# Patient Record
Sex: Female | Born: 1935 | Race: White | Hispanic: No | Marital: Married | Smoking: Never smoker
Health system: Southern US, Community
[De-identification: ages and names within clinical notes are randomized; demographics above are authoritative.]

## PROBLEM LIST (undated history)

## (undated) ENCOUNTER — Emergency Department: Admission: EM | Discharge status: Absent without leave | Payer: Medicare Other

## (undated) DIAGNOSIS — F329 Major depressive disorder, single episode, unspecified: Secondary | ICD-10-CM

## (undated) DIAGNOSIS — E039 Hypothyroidism, unspecified: Secondary | ICD-10-CM

## (undated) DIAGNOSIS — I2510 Atherosclerotic heart disease of native coronary artery without angina pectoris: Secondary | ICD-10-CM

## (undated) DIAGNOSIS — I499 Cardiac arrhythmia, unspecified: Secondary | ICD-10-CM

## (undated) DIAGNOSIS — F0390 Unspecified dementia without behavioral disturbance: Secondary | ICD-10-CM

## (undated) DIAGNOSIS — N189 Chronic kidney disease, unspecified: Secondary | ICD-10-CM

## (undated) DIAGNOSIS — F32A Depression, unspecified: Secondary | ICD-10-CM

## (undated) DIAGNOSIS — I509 Heart failure, unspecified: Secondary | ICD-10-CM

## (undated) DIAGNOSIS — M1990 Unspecified osteoarthritis, unspecified site: Secondary | ICD-10-CM

## (undated) DIAGNOSIS — C801 Malignant (primary) neoplasm, unspecified: Secondary | ICD-10-CM

## (undated) DIAGNOSIS — I4891 Unspecified atrial fibrillation: Secondary | ICD-10-CM

## (undated) DIAGNOSIS — Z992 Dependence on renal dialysis: Secondary | ICD-10-CM

## (undated) DIAGNOSIS — I639 Cerebral infarction, unspecified: Secondary | ICD-10-CM

## (undated) DIAGNOSIS — F419 Anxiety disorder, unspecified: Secondary | ICD-10-CM

## (undated) DIAGNOSIS — D649 Anemia, unspecified: Secondary | ICD-10-CM

## (undated) DIAGNOSIS — I10 Essential (primary) hypertension: Secondary | ICD-10-CM

## (undated) DIAGNOSIS — C7A1 Malignant poorly differentiated neuroendocrine tumors: Secondary | ICD-10-CM

## (undated) DIAGNOSIS — I340 Nonrheumatic mitral (valve) insufficiency: Secondary | ICD-10-CM

## (undated) DIAGNOSIS — M810 Age-related osteoporosis without current pathological fracture: Secondary | ICD-10-CM

## (undated) HISTORY — DX: Unspecified atrial fibrillation: I48.91

## (undated) HISTORY — PX: CHOLECYSTECTOMY: SHX55

## (undated) HISTORY — PX: BACK SURGERY: SHX140

## (undated) HISTORY — PX: ABDOMINAL HYSTERECTOMY: SHX81

## (undated) HISTORY — PX: JOINT REPLACEMENT: SHX530

## (undated) HISTORY — PX: EYE SURGERY: SHX253

## (undated) HISTORY — DX: Malignant poorly differentiated neuroendocrine tumors: C7A.1

## (undated) HISTORY — PX: SHOULDER SURGERY: SHX246

---

## 2013-05-07 ENCOUNTER — Ambulatory Visit: Payer: Self-pay | Admitting: Oncology

## 2013-05-26 ENCOUNTER — Ambulatory Visit: Payer: Self-pay | Admitting: Oncology

## 2013-05-26 LAB — CBC CANCER CENTER
BASOS ABS: 0 x10 3/mm (ref 0.0–0.1)
Basophil %: 0.3 %
Eosinophil #: 0.3 x10 3/mm (ref 0.0–0.7)
Eosinophil %: 5.3 %
HCT: 32.8 % — ABNORMAL LOW (ref 35.0–47.0)
HGB: 10.5 g/dL — AB (ref 12.0–16.0)
Lymphocyte #: 1.4 x10 3/mm (ref 1.0–3.6)
Lymphocyte %: 22.8 %
MCH: 28.6 pg (ref 26.0–34.0)
MCHC: 32.1 g/dL (ref 32.0–36.0)
MCV: 89 fL (ref 80–100)
Monocyte #: 0.4 x10 3/mm (ref 0.2–0.9)
Monocyte %: 6.2 %
NEUTROS ABS: 3.9 x10 3/mm (ref 1.4–6.5)
Neutrophil %: 65.4 %
PLATELETS: 202 x10 3/mm (ref 150–440)
RBC: 3.68 10*6/uL — AB (ref 3.80–5.20)
RDW: 14.5 % (ref 11.5–14.5)
WBC: 6 x10 3/mm (ref 3.6–11.0)

## 2013-05-26 LAB — COMPREHENSIVE METABOLIC PANEL
ALBUMIN: 3.7 g/dL (ref 3.4–5.0)
ALT: 15 U/L (ref 12–78)
Alkaline Phosphatase: 91 U/L
Anion Gap: 9 (ref 7–16)
BUN: 49 mg/dL — ABNORMAL HIGH (ref 7–18)
Bilirubin,Total: 0.4 mg/dL (ref 0.2–1.0)
Calcium, Total: 8.9 mg/dL (ref 8.5–10.1)
Chloride: 105 mmol/L (ref 98–107)
Co2: 26 mmol/L (ref 21–32)
Creatinine: 2.39 mg/dL — ABNORMAL HIGH (ref 0.60–1.30)
GFR CALC AF AMER: 21 — AB
GFR CALC NON AF AMER: 18 — AB
GLUCOSE: 103 mg/dL — AB (ref 65–99)
OSMOLALITY: 293 (ref 275–301)
POTASSIUM: 4.4 mmol/L (ref 3.5–5.1)
SGOT(AST): 21 U/L (ref 15–37)
Sodium: 140 mmol/L (ref 136–145)
TOTAL PROTEIN: 6.9 g/dL (ref 6.4–8.2)

## 2013-06-20 ENCOUNTER — Ambulatory Visit: Payer: Self-pay | Admitting: Oncology

## 2013-06-30 ENCOUNTER — Ambulatory Visit: Payer: Self-pay | Admitting: Nephrology

## 2013-07-21 ENCOUNTER — Ambulatory Visit: Payer: Self-pay | Admitting: Oncology

## 2013-08-18 LAB — CBC CANCER CENTER
BASOS PCT: 1.4 %
Basophil #: 0.1 x10 3/mm (ref 0.0–0.1)
EOS ABS: 0.2 x10 3/mm (ref 0.0–0.7)
EOS PCT: 2.7 %
HCT: 31.9 % — ABNORMAL LOW (ref 35.0–47.0)
HGB: 10.8 g/dL — ABNORMAL LOW (ref 12.0–16.0)
LYMPHS PCT: 19.7 %
Lymphocyte #: 1.3 x10 3/mm (ref 1.0–3.6)
MCH: 29.9 pg (ref 26.0–34.0)
MCHC: 33.7 g/dL (ref 32.0–36.0)
MCV: 89 fL (ref 80–100)
MONO ABS: 0.4 x10 3/mm (ref 0.2–0.9)
Monocyte %: 6.2 %
NEUTROS PCT: 70 %
Neutrophil #: 4.6 x10 3/mm (ref 1.4–6.5)
PLATELETS: 199 x10 3/mm (ref 150–440)
RBC: 3.6 10*6/uL — ABNORMAL LOW (ref 3.80–5.20)
RDW: 13.4 % (ref 11.5–14.5)
WBC: 6.6 x10 3/mm (ref 3.6–11.0)

## 2013-08-18 LAB — COMPREHENSIVE METABOLIC PANEL
ALBUMIN: 3.4 g/dL (ref 3.4–5.0)
ANION GAP: 7 (ref 7–16)
Alkaline Phosphatase: 89 U/L
BILIRUBIN TOTAL: 0.3 mg/dL (ref 0.2–1.0)
BUN: 47 mg/dL — ABNORMAL HIGH (ref 7–18)
CREATININE: 2.51 mg/dL — AB (ref 0.60–1.30)
Calcium, Total: 8.6 mg/dL (ref 8.5–10.1)
Chloride: 103 mmol/L (ref 98–107)
Co2: 28 mmol/L (ref 21–32)
EGFR (Non-African Amer.): 17 — ABNORMAL LOW
GFR CALC AF AMER: 20 — AB
Glucose: 101 mg/dL — ABNORMAL HIGH (ref 65–99)
OSMOLALITY: 288 (ref 275–301)
Potassium: 4.6 mmol/L (ref 3.5–5.1)
SGOT(AST): 19 U/L (ref 15–37)
SGPT (ALT): 16 U/L (ref 12–78)
Sodium: 138 mmol/L (ref 136–145)
Total Protein: 6.6 g/dL (ref 6.4–8.2)

## 2013-08-20 ENCOUNTER — Ambulatory Visit: Payer: Self-pay | Admitting: Oncology

## 2013-08-23 DIAGNOSIS — N184 Chronic kidney disease, stage 4 (severe): Secondary | ICD-10-CM | POA: Insufficient documentation

## 2013-08-23 DIAGNOSIS — E039 Hypothyroidism, unspecified: Secondary | ICD-10-CM | POA: Insufficient documentation

## 2013-08-23 DIAGNOSIS — I2510 Atherosclerotic heart disease of native coronary artery without angina pectoris: Secondary | ICD-10-CM | POA: Insufficient documentation

## 2013-09-20 ENCOUNTER — Ambulatory Visit: Payer: Self-pay | Admitting: Oncology

## 2013-10-21 ENCOUNTER — Ambulatory Visit: Payer: Self-pay | Admitting: Oncology

## 2013-11-10 LAB — COMPREHENSIVE METABOLIC PANEL
ALBUMIN: 3.5 g/dL (ref 3.4–5.0)
ANION GAP: 8 (ref 7–16)
Alkaline Phosphatase: 108 U/L
BUN: 36 mg/dL — AB (ref 7–18)
Bilirubin,Total: 0.4 mg/dL (ref 0.2–1.0)
CO2: 25 mmol/L (ref 21–32)
Calcium, Total: 8.7 mg/dL (ref 8.5–10.1)
Chloride: 106 mmol/L (ref 98–107)
Creatinine: 2.24 mg/dL — ABNORMAL HIGH (ref 0.60–1.30)
GFR CALC AF AMER: 22 — AB
GFR CALC NON AF AMER: 19 — AB
Glucose: 179 mg/dL — ABNORMAL HIGH (ref 65–99)
Osmolality: 290 (ref 275–301)
POTASSIUM: 4.6 mmol/L (ref 3.5–5.1)
SGOT(AST): 21 U/L (ref 15–37)
SGPT (ALT): 21 U/L
Sodium: 139 mmol/L (ref 136–145)
TOTAL PROTEIN: 6.5 g/dL (ref 6.4–8.2)

## 2013-11-10 LAB — CBC CANCER CENTER
Basophil #: 0.1 x10 3/mm (ref 0.0–0.1)
Basophil %: 1.4 %
Eosinophil #: 0.1 x10 3/mm (ref 0.0–0.7)
Eosinophil %: 3 %
HCT: 31.4 % — ABNORMAL LOW (ref 35.0–47.0)
HGB: 10.2 g/dL — ABNORMAL LOW (ref 12.0–16.0)
LYMPHS PCT: 28.5 %
Lymphocyte #: 1.3 x10 3/mm (ref 1.0–3.6)
MCH: 29.5 pg (ref 26.0–34.0)
MCHC: 32.5 g/dL (ref 32.0–36.0)
MCV: 91 fL (ref 80–100)
MONOS PCT: 5.5 %
Monocyte #: 0.2 x10 3/mm (ref 0.2–0.9)
Neutrophil #: 2.7 x10 3/mm (ref 1.4–6.5)
Neutrophil %: 61.6 %
Platelet: 207 x10 3/mm (ref 150–440)
RBC: 3.47 10*6/uL — AB (ref 3.80–5.20)
RDW: 13.6 % (ref 11.5–14.5)
WBC: 4.4 x10 3/mm (ref 3.6–11.0)

## 2013-11-20 ENCOUNTER — Ambulatory Visit: Payer: Self-pay | Admitting: Oncology

## 2013-11-26 ENCOUNTER — Ambulatory Visit: Payer: Self-pay | Admitting: Orthopedic Surgery

## 2013-11-26 LAB — BODY FLUID CELL COUNT WITH DIFFERENTIAL
Basophil: 0 %
EOS PCT: 0 %
Lymphocytes: 0 %
NUCLEATED CELL COUNT: 76836 /mm3
Neutrophils: 100 %
OTHER CELLS BF: 0 %
OTHER MONONUCLEAR CELLS: 0 %

## 2013-11-27 ENCOUNTER — Inpatient Hospital Stay: Payer: Self-pay | Admitting: Orthopedic Surgery

## 2013-11-27 DIAGNOSIS — I2510 Atherosclerotic heart disease of native coronary artery without angina pectoris: Secondary | ICD-10-CM

## 2013-11-27 DIAGNOSIS — I10 Essential (primary) hypertension: Secondary | ICD-10-CM

## 2013-11-27 LAB — BASIC METABOLIC PANEL
ANION GAP: 9 (ref 7–16)
BUN: 29 mg/dL — ABNORMAL HIGH (ref 7–18)
Calcium, Total: 8.8 mg/dL (ref 8.5–10.1)
Chloride: 102 mmol/L (ref 98–107)
Co2: 23 mmol/L (ref 21–32)
Creatinine: 2.56 mg/dL — ABNORMAL HIGH (ref 0.60–1.30)
EGFR (Non-African Amer.): 19 — ABNORMAL LOW
GFR CALC AF AMER: 23 — AB
Glucose: 108 mg/dL — ABNORMAL HIGH (ref 65–99)
OSMOLALITY: 275 (ref 275–301)
Potassium: 4.7 mmol/L (ref 3.5–5.1)
Sodium: 134 mmol/L — ABNORMAL LOW (ref 136–145)

## 2013-11-27 LAB — PROTIME-INR
INR: 1.3
Prothrombin Time: 16.3 secs — ABNORMAL HIGH (ref 11.5–14.7)

## 2013-11-27 LAB — APTT: Activated PTT: 47.5 secs — ABNORMAL HIGH (ref 23.6–35.9)

## 2013-11-27 LAB — MRSA PCR SCREENING

## 2013-11-28 LAB — BASIC METABOLIC PANEL
Anion Gap: 10 (ref 7–16)
BUN: 41 mg/dL — AB (ref 7–18)
CHLORIDE: 105 mmol/L (ref 98–107)
CO2: 20 mmol/L — AB (ref 21–32)
Calcium, Total: 7.5 mg/dL — ABNORMAL LOW (ref 8.5–10.1)
Creatinine: 2.5 mg/dL — ABNORMAL HIGH (ref 0.60–1.30)
EGFR (African American): 24 — ABNORMAL LOW
GFR CALC NON AF AMER: 19 — AB
Glucose: 111 mg/dL — ABNORMAL HIGH (ref 65–99)
Osmolality: 281 (ref 275–301)
POTASSIUM: 4.5 mmol/L (ref 3.5–5.1)
SODIUM: 135 mmol/L — AB (ref 136–145)

## 2013-11-28 LAB — PLATELET COUNT: Platelet: 154 10*3/uL (ref 150–440)

## 2013-11-28 LAB — HEMOGLOBIN: HGB: 7.9 g/dL — ABNORMAL LOW (ref 12.0–16.0)

## 2013-11-29 LAB — HEMOGLOBIN: HGB: 7.4 g/dL — ABNORMAL LOW (ref 12.0–16.0)

## 2013-11-30 LAB — BASIC METABOLIC PANEL
ANION GAP: 7 (ref 7–16)
BUN: 31 mg/dL — ABNORMAL HIGH (ref 7–18)
CHLORIDE: 104 mmol/L (ref 98–107)
CREATININE: 2.38 mg/dL — AB (ref 0.60–1.30)
Calcium, Total: 7.3 mg/dL — ABNORMAL LOW (ref 8.5–10.1)
Co2: 21 mmol/L (ref 21–32)
EGFR (African American): 25 — ABNORMAL LOW
EGFR (Non-African Amer.): 21 — ABNORMAL LOW
Glucose: 117 mg/dL — ABNORMAL HIGH (ref 65–99)
Osmolality: 272 (ref 275–301)
Potassium: 4.5 mmol/L (ref 3.5–5.1)
Sodium: 132 mmol/L — ABNORMAL LOW (ref 136–145)

## 2013-11-30 LAB — HEMOGLOBIN: HGB: 8.4 g/dL — AB (ref 12.0–16.0)

## 2013-11-30 LAB — BODY FLUID CULTURE

## 2013-12-01 LAB — WOUND CULTURE

## 2013-12-10 ENCOUNTER — Ambulatory Visit: Payer: Self-pay | Admitting: Oncology

## 2013-12-10 LAB — COMPREHENSIVE METABOLIC PANEL
ALBUMIN: 2.4 g/dL — AB (ref 3.4–5.0)
ALT: 18 U/L
ANION GAP: 7 (ref 7–16)
Alkaline Phosphatase: 118 U/L — ABNORMAL HIGH
BILIRUBIN TOTAL: 0.3 mg/dL (ref 0.2–1.0)
BUN: 26 mg/dL — AB (ref 7–18)
Calcium, Total: 8.3 mg/dL — ABNORMAL LOW (ref 8.5–10.1)
Chloride: 100 mmol/L (ref 98–107)
Co2: 25 mmol/L (ref 21–32)
Creatinine: 1.87 mg/dL — ABNORMAL HIGH (ref 0.60–1.30)
EGFR (Non-African Amer.): 27 — ABNORMAL LOW
GFR CALC AF AMER: 33 — AB
Glucose: 104 mg/dL — ABNORMAL HIGH (ref 65–99)
Osmolality: 270 (ref 275–301)
Potassium: 4.6 mmol/L (ref 3.5–5.1)
SGOT(AST): 18 U/L (ref 15–37)
Sodium: 132 mmol/L — ABNORMAL LOW (ref 136–145)
TOTAL PROTEIN: 6.4 g/dL (ref 6.4–8.2)

## 2013-12-10 LAB — CBC CANCER CENTER
BASOS PCT: 1 %
Basophil #: 0.1 x10 3/mm (ref 0.0–0.1)
EOS ABS: 0.2 x10 3/mm (ref 0.0–0.7)
Eosinophil %: 2.2 %
HCT: 26.8 % — AB (ref 35.0–47.0)
HGB: 8.6 g/dL — ABNORMAL LOW (ref 12.0–16.0)
Lymphocyte #: 0.9 x10 3/mm — ABNORMAL LOW (ref 1.0–3.6)
Lymphocyte %: 10.1 %
MCH: 28.5 pg (ref 26.0–34.0)
MCHC: 32.2 g/dL (ref 32.0–36.0)
MCV: 88 fL (ref 80–100)
MONOS PCT: 7.3 %
Monocyte #: 0.7 x10 3/mm (ref 0.2–0.9)
NEUTROS ABS: 7.4 x10 3/mm — AB (ref 1.4–6.5)
NEUTROS PCT: 79.4 %
PLATELETS: 417 x10 3/mm (ref 150–440)
RBC: 3.04 10*6/uL — ABNORMAL LOW (ref 3.80–5.20)
RDW: 14.5 % (ref 11.5–14.5)
WBC: 9.3 x10 3/mm (ref 3.6–11.0)

## 2013-12-21 ENCOUNTER — Ambulatory Visit: Payer: Self-pay | Admitting: Oncology

## 2013-12-31 ENCOUNTER — Inpatient Hospital Stay: Payer: Self-pay | Admitting: Internal Medicine

## 2013-12-31 LAB — COMPREHENSIVE METABOLIC PANEL
ALK PHOS: 116 U/L
Albumin: 2.6 g/dL — ABNORMAL LOW (ref 3.4–5.0)
Anion Gap: 8 (ref 7–16)
BILIRUBIN TOTAL: 0.7 mg/dL (ref 0.2–1.0)
BUN: 21 mg/dL — AB (ref 7–18)
CO2: 26 mmol/L (ref 21–32)
CREATININE: 1.65 mg/dL — AB (ref 0.60–1.30)
Calcium, Total: 7.4 mg/dL — ABNORMAL LOW (ref 8.5–10.1)
Chloride: 86 mmol/L — ABNORMAL LOW (ref 98–107)
EGFR (African American): 38 — ABNORMAL LOW
EGFR (Non-African Amer.): 31 — ABNORMAL LOW
GLUCOSE: 106 mg/dL — AB (ref 65–99)
OSMOLALITY: 246 (ref 275–301)
Potassium: 4.8 mmol/L (ref 3.5–5.1)
SGOT(AST): 25 U/L (ref 15–37)
SGPT (ALT): 14 U/L
Sodium: 120 mmol/L — CL (ref 136–145)
TOTAL PROTEIN: 5.9 g/dL — AB (ref 6.4–8.2)

## 2013-12-31 LAB — CBC WITH DIFFERENTIAL/PLATELET
BASOS ABS: 0.1 10*3/uL (ref 0.0–0.1)
Basophil %: 1.1 %
EOS PCT: 1.7 %
Eosinophil #: 0.1 10*3/uL (ref 0.0–0.7)
HCT: 27 % — ABNORMAL LOW (ref 35.0–47.0)
HGB: 9 g/dL — ABNORMAL LOW (ref 12.0–16.0)
LYMPHS PCT: 12.2 %
Lymphocyte #: 1.1 10*3/uL (ref 1.0–3.6)
MCH: 28.4 pg (ref 26.0–34.0)
MCHC: 33.3 g/dL (ref 32.0–36.0)
MCV: 85 fL (ref 80–100)
MONO ABS: 0.6 x10 3/mm (ref 0.2–0.9)
MONOS PCT: 7 %
NEUTROS ABS: 6.8 10*3/uL — AB (ref 1.4–6.5)
NEUTROS PCT: 78 %
Platelet: 277 10*3/uL (ref 150–440)
RBC: 3.17 10*6/uL — AB (ref 3.80–5.20)
RDW: 15.1 % — ABNORMAL HIGH (ref 11.5–14.5)
WBC: 8.7 10*3/uL (ref 3.6–11.0)

## 2014-01-01 LAB — BASIC METABOLIC PANEL
Anion Gap: 9 (ref 7–16)
BUN: 20 mg/dL — ABNORMAL HIGH (ref 7–18)
Calcium, Total: 7.2 mg/dL — ABNORMAL LOW (ref 8.5–10.1)
Chloride: 84 mmol/L — ABNORMAL LOW (ref 98–107)
Co2: 25 mmol/L (ref 21–32)
Creatinine: 1.55 mg/dL — ABNORMAL HIGH (ref 0.60–1.30)
EGFR (Non-African Amer.): 34 — ABNORMAL LOW
GFR CALC AF AMER: 41 — AB
Glucose: 71 mg/dL (ref 65–99)
Osmolality: 240 (ref 275–301)
POTASSIUM: 4.8 mmol/L (ref 3.5–5.1)
SODIUM: 118 mmol/L — AB (ref 136–145)

## 2014-01-01 LAB — CBC WITH DIFFERENTIAL/PLATELET
BASOS ABS: 0.1 10*3/uL (ref 0.0–0.1)
BASOS PCT: 1.4 %
EOS ABS: 0.2 10*3/uL (ref 0.0–0.7)
EOS PCT: 3.6 %
HCT: 23.8 % — ABNORMAL LOW (ref 35.0–47.0)
HGB: 8 g/dL — ABNORMAL LOW (ref 12.0–16.0)
Lymphocyte #: 1.3 10*3/uL (ref 1.0–3.6)
Lymphocyte %: 19.3 %
MCH: 28.6 pg (ref 26.0–34.0)
MCHC: 33.8 g/dL (ref 32.0–36.0)
MCV: 85 fL (ref 80–100)
MONO ABS: 0.5 x10 3/mm (ref 0.2–0.9)
MONOS PCT: 8.2 %
NEUTROS PCT: 67.5 %
Neutrophil #: 4.4 10*3/uL (ref 1.4–6.5)
Platelet: 208 10*3/uL (ref 150–440)
RBC: 2.81 10*6/uL — ABNORMAL LOW (ref 3.80–5.20)
RDW: 14.8 % — ABNORMAL HIGH (ref 11.5–14.5)
WBC: 6.6 10*3/uL (ref 3.6–11.0)

## 2014-01-01 LAB — IRON AND TIBC
Iron Bind.Cap.(Total): 330 ug/dL (ref 250–450)
Iron Saturation: 15 %
Iron: 49 ug/dL — ABNORMAL LOW (ref 50–170)
Unbound Iron-Bind.Cap.: 281 ug/dL

## 2014-01-01 LAB — FERRITIN: Ferritin (ARMC): 232 ng/mL (ref 8–388)

## 2014-01-01 LAB — TSH: Thyroid Stimulating Horm: 1.48 u[IU]/mL

## 2014-01-02 LAB — BASIC METABOLIC PANEL WITH GFR
Anion Gap: 9
BUN: 17 mg/dL
Calcium, Total: 8 mg/dL — ABNORMAL LOW
Chloride: 85 mmol/L — ABNORMAL LOW
Co2: 23 mmol/L
Creatinine: 1.47 mg/dL — ABNORMAL HIGH
EGFR (African American): 43 — ABNORMAL LOW
EGFR (Non-African Amer.): 36 — ABNORMAL LOW
Glucose: 116 mg/dL — ABNORMAL HIGH
Osmolality: 239
Potassium: 4.3 mmol/L
Sodium: 117 mmol/L — CL

## 2014-01-02 LAB — CBC WITH DIFFERENTIAL/PLATELET
BASOS PCT: 0.9 %
Basophil #: 0.1 10*3/uL (ref 0.0–0.1)
Eosinophil #: 0.2 10*3/uL (ref 0.0–0.7)
Eosinophil %: 2 %
HCT: 25.8 % — ABNORMAL LOW (ref 35.0–47.0)
HGB: 8.8 g/dL — ABNORMAL LOW (ref 12.0–16.0)
LYMPHS ABS: 1 10*3/uL (ref 1.0–3.6)
Lymphocyte %: 12.3 %
MCH: 28.7 pg (ref 26.0–34.0)
MCHC: 34 g/dL (ref 32.0–36.0)
MCV: 84 fL (ref 80–100)
MONO ABS: 0.5 x10 3/mm (ref 0.2–0.9)
MONOS PCT: 6 %
NEUTROS PCT: 78.8 %
Neutrophil #: 6.6 10*3/uL — ABNORMAL HIGH (ref 1.4–6.5)
Platelet: 229 10*3/uL (ref 150–440)
RBC: 3.06 10*6/uL — ABNORMAL LOW (ref 3.80–5.20)
RDW: 14.9 % — ABNORMAL HIGH (ref 11.5–14.5)
WBC: 8.4 10*3/uL (ref 3.6–11.0)

## 2014-01-02 LAB — SODIUM, URINE, RANDOM: Sodium, Urine Random: 79 mmol/L

## 2014-01-02 LAB — SODIUM: SODIUM: 118 mmol/L — AB (ref 136–145)

## 2014-01-02 LAB — CHLORIDE, URINE, RANDOM: Chloride, Urine Random: 91 mmol/L

## 2014-01-02 LAB — OSMOLALITY, URINE: Osmolality: 342 mosm/kg

## 2014-01-03 LAB — SODIUM: Sodium: 121 mmol/L — ABNORMAL LOW (ref 136–145)

## 2014-01-03 LAB — CBC WITH DIFFERENTIAL/PLATELET
Basophil #: 0.1 10*3/uL (ref 0.0–0.1)
Basophil %: 1.4 %
Eosinophil #: 0.2 10*3/uL (ref 0.0–0.7)
Eosinophil %: 2.8 %
HCT: 24.3 % — ABNORMAL LOW (ref 35.0–47.0)
HGB: 8.2 g/dL — ABNORMAL LOW (ref 12.0–16.0)
LYMPHS ABS: 0.9 10*3/uL — AB (ref 1.0–3.6)
LYMPHS PCT: 10.5 %
MCH: 28.3 pg (ref 26.0–34.0)
MCHC: 33.9 g/dL (ref 32.0–36.0)
MCV: 84 fL (ref 80–100)
MONO ABS: 0.7 x10 3/mm (ref 0.2–0.9)
Monocyte %: 7.8 %
NEUTROS ABS: 6.7 10*3/uL — AB (ref 1.4–6.5)
Neutrophil %: 77.5 %
PLATELETS: 210 10*3/uL (ref 150–440)
RBC: 2.91 10*6/uL — ABNORMAL LOW (ref 3.80–5.20)
RDW: 14.8 % — AB (ref 11.5–14.5)
WBC: 8.7 10*3/uL (ref 3.6–11.0)

## 2014-01-03 LAB — BASIC METABOLIC PANEL
ANION GAP: 10 (ref 7–16)
BUN: 16 mg/dL (ref 7–18)
CALCIUM: 7.6 mg/dL — AB (ref 8.5–10.1)
CO2: 24 mmol/L (ref 21–32)
Chloride: 88 mmol/L — ABNORMAL LOW (ref 98–107)
Creatinine: 1.46 mg/dL — ABNORMAL HIGH (ref 0.60–1.30)
EGFR (African American): 44 — ABNORMAL LOW
EGFR (Non-African Amer.): 36 — ABNORMAL LOW
Glucose: 87 mg/dL (ref 65–99)
Osmolality: 246 (ref 275–301)
Potassium: 3.7 mmol/L (ref 3.5–5.1)
Sodium: 122 mmol/L — ABNORMAL LOW (ref 136–145)

## 2014-01-04 LAB — BASIC METABOLIC PANEL
Anion Gap: 10 (ref 7–16)
BUN: 15 mg/dL (ref 7–18)
CALCIUM: 7.6 mg/dL — AB (ref 8.5–10.1)
Chloride: 88 mmol/L — ABNORMAL LOW (ref 98–107)
Co2: 25 mmol/L (ref 21–32)
Creatinine: 1.38 mg/dL — ABNORMAL HIGH (ref 0.60–1.30)
EGFR (African American): 47 — ABNORMAL LOW
EGFR (Non-African Amer.): 39 — ABNORMAL LOW
Glucose: 85 mg/dL (ref 65–99)
Osmolality: 248 (ref 275–301)
Potassium: 3.9 mmol/L (ref 3.5–5.1)
SODIUM: 123 mmol/L — AB (ref 136–145)

## 2014-01-04 LAB — CBC WITH DIFFERENTIAL/PLATELET
Basophil #: 0.1 10*3/uL (ref 0.0–0.1)
Basophil %: 1.3 %
Eosinophil #: 0.3 10*3/uL (ref 0.0–0.7)
Eosinophil %: 3.1 %
HCT: 23.2 % — ABNORMAL LOW (ref 35.0–47.0)
HGB: 8.1 g/dL — ABNORMAL LOW (ref 12.0–16.0)
LYMPHS PCT: 10.2 %
Lymphocyte #: 0.9 10*3/uL — ABNORMAL LOW (ref 1.0–3.6)
MCH: 29.1 pg (ref 26.0–34.0)
MCHC: 34.9 g/dL (ref 32.0–36.0)
MCV: 83 fL (ref 80–100)
MONO ABS: 0.6 x10 3/mm (ref 0.2–0.9)
MONOS PCT: 6.8 %
Neutrophil #: 6.5 10*3/uL (ref 1.4–6.5)
Neutrophil %: 78.6 %
Platelet: 197 10*3/uL (ref 150–440)
RBC: 2.79 10*6/uL — ABNORMAL LOW (ref 3.80–5.20)
RDW: 15.2 % — AB (ref 11.5–14.5)
WBC: 8.3 10*3/uL (ref 3.6–11.0)

## 2014-01-05 LAB — BASIC METABOLIC PANEL
Anion Gap: 8 (ref 7–16)
BUN: 18 mg/dL (ref 7–18)
CALCIUM: 7.6 mg/dL — AB (ref 8.5–10.1)
CO2: 27 mmol/L (ref 21–32)
CREATININE: 1.45 mg/dL — AB (ref 0.60–1.30)
Chloride: 90 mmol/L — ABNORMAL LOW (ref 98–107)
GFR CALC AF AMER: 44 — AB
GFR CALC NON AF AMER: 36 — AB
GLUCOSE: 87 mg/dL (ref 65–99)
OSMOLALITY: 253 (ref 275–301)
Potassium: 3.9 mmol/L (ref 3.5–5.1)
SODIUM: 125 mmol/L — AB (ref 136–145)

## 2014-01-05 LAB — SODIUM: Sodium: 124 mmol/L — ABNORMAL LOW (ref 136–145)

## 2014-01-06 LAB — BASIC METABOLIC PANEL
Anion Gap: 8 (ref 7–16)
BUN: 19 mg/dL — ABNORMAL HIGH (ref 7–18)
CALCIUM: 7.7 mg/dL — AB (ref 8.5–10.1)
CO2: 27 mmol/L (ref 21–32)
Chloride: 94 mmol/L — ABNORMAL LOW (ref 98–107)
Creatinine: 1.53 mg/dL — ABNORMAL HIGH (ref 0.60–1.30)
GFR CALC AF AMER: 41 — AB
GFR CALC NON AF AMER: 34 — AB
Glucose: 92 mg/dL (ref 65–99)
Osmolality: 261 (ref 275–301)
Potassium: 3.8 mmol/L (ref 3.5–5.1)
SODIUM: 129 mmol/L — AB (ref 136–145)

## 2014-01-06 LAB — PROT IMMUNOELECTROPHORES(ARMC)

## 2014-01-06 LAB — SODIUM: Sodium: 125 mmol/L — ABNORMAL LOW (ref 136–145)

## 2014-01-12 DIAGNOSIS — Z96659 Presence of unspecified artificial knee joint: Secondary | ICD-10-CM

## 2014-01-12 DIAGNOSIS — T8459XA Infection and inflammatory reaction due to other internal joint prosthesis, initial encounter: Secondary | ICD-10-CM | POA: Insufficient documentation

## 2014-01-12 DIAGNOSIS — T8579XA Infection and inflammatory reaction due to other internal prosthetic devices, implants and grafts, initial encounter: Secondary | ICD-10-CM | POA: Insufficient documentation

## 2014-01-20 ENCOUNTER — Ambulatory Visit: Payer: Self-pay | Admitting: Oncology

## 2014-01-20 LAB — CBC CANCER CENTER
Basophil #: 0.2 x10 3/mm — ABNORMAL HIGH (ref 0.0–0.1)
Basophil %: 3.4 %
EOS ABS: 0.2 x10 3/mm (ref 0.0–0.7)
Eosinophil %: 5.1 %
HCT: 22.6 % — AB (ref 35.0–47.0)
HGB: 7.4 g/dL — ABNORMAL LOW (ref 12.0–16.0)
Lymphocyte #: 0.7 x10 3/mm — ABNORMAL LOW (ref 1.0–3.6)
Lymphocyte %: 15.7 %
MCH: 28.4 pg (ref 26.0–34.0)
MCHC: 32.7 g/dL (ref 32.0–36.0)
MCV: 87 fL (ref 80–100)
Monocyte #: 0.4 x10 3/mm (ref 0.2–0.9)
Monocyte %: 8.1 %
Neutrophil #: 3 x10 3/mm (ref 1.4–6.5)
Neutrophil %: 67.7 %
Platelet: 293 x10 3/mm (ref 150–440)
RBC: 2.6 10*6/uL — ABNORMAL LOW (ref 3.80–5.20)
RDW: 16.1 % — AB (ref 11.5–14.5)
WBC: 4.5 x10 3/mm (ref 3.6–11.0)

## 2014-01-20 LAB — COMPREHENSIVE METABOLIC PANEL
ANION GAP: 10 (ref 7–16)
Albumin: 2.8 g/dL — ABNORMAL LOW (ref 3.4–5.0)
Alkaline Phosphatase: 68 U/L
BILIRUBIN TOTAL: 0.4 mg/dL (ref 0.2–1.0)
BUN: 33 mg/dL — AB (ref 7–18)
CHLORIDE: 108 mmol/L — AB (ref 98–107)
CO2: 22 mmol/L (ref 21–32)
CREATININE: 2.61 mg/dL — AB (ref 0.60–1.30)
Calcium, Total: 7.9 mg/dL — ABNORMAL LOW (ref 8.5–10.1)
EGFR (African American): 22 — ABNORMAL LOW
GFR CALC NON AF AMER: 18 — AB
Glucose: 118 mg/dL — ABNORMAL HIGH (ref 65–99)
Osmolality: 288 (ref 275–301)
Potassium: 4.1 mmol/L (ref 3.5–5.1)
SGOT(AST): 17 U/L (ref 15–37)
SGPT (ALT): 15 U/L
Sodium: 140 mmol/L (ref 136–145)
Total Protein: 5.9 g/dL — ABNORMAL LOW (ref 6.4–8.2)

## 2014-01-20 LAB — IRON AND TIBC
Iron Bind.Cap.(Total): 322 ug/dL (ref 250–450)
Iron Saturation: 14 %
Iron: 46 ug/dL — ABNORMAL LOW (ref 50–170)
UNBOUND IRON-BIND. CAP.: 276 ug/dL

## 2014-01-20 LAB — FERRITIN: Ferritin (ARMC): 115 ng/mL (ref 8–388)

## 2014-01-23 DIAGNOSIS — E222 Syndrome of inappropriate secretion of antidiuretic hormone: Secondary | ICD-10-CM | POA: Insufficient documentation

## 2014-01-23 DIAGNOSIS — E236 Other disorders of pituitary gland: Secondary | ICD-10-CM | POA: Insufficient documentation

## 2014-02-20 ENCOUNTER — Ambulatory Visit: Payer: Self-pay | Admitting: Oncology

## 2014-02-25 LAB — COMPREHENSIVE METABOLIC PANEL
ALBUMIN: 3.5 g/dL (ref 3.4–5.0)
ANION GAP: 8 (ref 7–16)
Alkaline Phosphatase: 88 U/L
BILIRUBIN TOTAL: 0.2 mg/dL (ref 0.2–1.0)
BUN: 43 mg/dL — ABNORMAL HIGH (ref 7–18)
CHLORIDE: 104 mmol/L (ref 98–107)
Calcium, Total: 8.4 mg/dL — ABNORMAL LOW (ref 8.5–10.1)
Co2: 27 mmol/L (ref 21–32)
Creatinine: 2.31 mg/dL — ABNORMAL HIGH (ref 0.60–1.30)
EGFR (Non-African Amer.): 21 — ABNORMAL LOW
GFR CALC AF AMER: 26 — AB
Glucose: 91 mg/dL (ref 65–99)
OSMOLALITY: 288 (ref 275–301)
Potassium: 4.5 mmol/L (ref 3.5–5.1)
SGOT(AST): 23 U/L (ref 15–37)
SGPT (ALT): 23 U/L
Sodium: 139 mmol/L (ref 136–145)
TOTAL PROTEIN: 6.8 g/dL (ref 6.4–8.2)

## 2014-02-25 LAB — CBC CANCER CENTER
BASOS PCT: 1.2 %
Basophil #: 0.1 x10 3/mm (ref 0.0–0.1)
EOS ABS: 0.1 x10 3/mm (ref 0.0–0.7)
EOS PCT: 1.2 %
HCT: 31.6 % — ABNORMAL LOW (ref 35.0–47.0)
HGB: 10.2 g/dL — AB (ref 12.0–16.0)
Lymphocyte #: 0.9 x10 3/mm — ABNORMAL LOW (ref 1.0–3.6)
Lymphocyte %: 15.1 %
MCH: 27.4 pg (ref 26.0–34.0)
MCHC: 32.3 g/dL (ref 32.0–36.0)
MCV: 85 fL (ref 80–100)
Monocyte #: 0.4 x10 3/mm (ref 0.2–0.9)
Monocyte %: 7 %
NEUTROS ABS: 4.6 x10 3/mm (ref 1.4–6.5)
Neutrophil %: 75.5 %
Platelet: 193 x10 3/mm (ref 150–440)
RBC: 3.73 10*6/uL — AB (ref 3.80–5.20)
RDW: 15.5 % — ABNORMAL HIGH (ref 11.5–14.5)
WBC: 6.1 x10 3/mm (ref 3.6–11.0)

## 2014-03-09 DIAGNOSIS — M810 Age-related osteoporosis without current pathological fracture: Secondary | ICD-10-CM | POA: Insufficient documentation

## 2014-03-23 ENCOUNTER — Ambulatory Visit: Payer: Self-pay | Admitting: Oncology

## 2014-04-21 ENCOUNTER — Ambulatory Visit
Admit: 2014-04-21 | Disposition: A | Discharge status: Home / Self Care | Payer: Self-pay | Attending: Oncology | Admitting: Oncology

## 2014-05-20 LAB — COMPREHENSIVE METABOLIC PANEL
ALT: 12 U/L — AB
Albumin: 4.1 g/dL
Alkaline Phosphatase: 90 U/L
Anion Gap: 7 (ref 7–16)
BUN: 61 mg/dL — ABNORMAL HIGH
Bilirubin,Total: 0.5 mg/dL
CALCIUM: 8.7 mg/dL — AB
CHLORIDE: 106 mmol/L
CO2: 22 mmol/L
Creatinine: 2.72 mg/dL — ABNORMAL HIGH
EGFR (African American): 18 — ABNORMAL LOW
EGFR (Non-African Amer.): 15 — ABNORMAL LOW
Glucose: 91 mg/dL
Potassium: 4.5 mmol/L
SGOT(AST): 23 U/L
SODIUM: 135 mmol/L
Total Protein: 7 g/dL

## 2014-05-20 LAB — CBC CANCER CENTER
BASOS ABS: 0.1 x10 3/mm (ref 0.0–0.1)
Basophil %: 2.2 %
EOS ABS: 0.1 x10 3/mm (ref 0.0–0.7)
Eosinophil %: 3.2 %
HCT: 30.8 % — AB (ref 35.0–47.0)
HGB: 10 g/dL — ABNORMAL LOW (ref 12.0–16.0)
LYMPHS PCT: 24.2 %
Lymphocyte #: 1.1 x10 3/mm (ref 1.0–3.6)
MCH: 28.1 pg (ref 26.0–34.0)
MCHC: 32.7 g/dL (ref 32.0–36.0)
MCV: 86 fL (ref 80–100)
MONOS PCT: 8.2 %
Monocyte #: 0.4 x10 3/mm (ref 0.2–0.9)
NEUTROS PCT: 62.2 %
Neutrophil #: 2.7 x10 3/mm (ref 1.4–6.5)
PLATELETS: 204 x10 3/mm (ref 150–440)
RBC: 3.58 10*6/uL — ABNORMAL LOW (ref 3.80–5.20)
RDW: 16.8 % — AB (ref 11.5–14.5)
WBC: 4.3 x10 3/mm (ref 3.6–11.0)

## 2014-05-22 ENCOUNTER — Ambulatory Visit
Admit: 2014-05-22 | Disposition: A | Discharge status: Home / Self Care | Payer: Self-pay | Attending: Oncology | Admitting: Oncology

## 2014-06-13 NOTE — Op Note (Signed)
PATIENT NAME:  Kerry Walsh, Kerry Walsh MR#:  [id number] DATE OF BIRTH:  04-17-1925  DATE OF PROCEDURE:  11/27/2013  PREOPERATIVE DIAGNOSIS:  Acute infection, right total knee.   POSTOPERATIVE DIAGNOSIS:  Acute infection, right total knee.   PROCEDURE:  Irrigation and debridement of septic knee with exchange of polyethylene.   ANESTHESIA:  General.   SURGEON:  Laurene Footman, MD   DESCRIPTION OF PROCEDURE:  The patient was brought to the operating room, and after adequate anesthesia was obtained, the right leg was prepped and draped in the usual sterile fashion with a tourniquet applied to the upper thigh. After prepping and draping in the usual sterile fashion, appropriate patient identification and timeout procedures were completed. The tourniquet was raised to 300 mmHg at the upper thigh. A midline skin incision was made using the prior skin incision followed by a medial parapatellar arthrotomy with the prior sutures removed. An initial culture was obtained and the knee opened. There was some bloody, cloudy fluid present within the joint, as well as moderate synovitis. Two pieces of synovium were removed from the medial and lateral aspect of the knee and also sent as separate cultures. A rongeur was used to debride the synovium, separating it off of the capsule around the suprapatellar pouch as well as mediolateral gutters and in the infrapatellar area. After this initial debridement had been carried out, the knee was irrigated with pulsatile lavage. The prior polyethylene insert was removed without difficulty. It was noted to be a size 5 with 9 mm thickness. A VersaJet was then used to debride the exposed bone, as well as the soft tissue and metal components as well as patella at a lower setting. After thorough debridement, the knee was soaked with diluted Betadine solution for 5 minutes, and then this was irrigated out. At this point, it appeared there had been an adequate debridement of the synovitis.  The wound was again thoroughly irrigated with pulsatile lavage. A new patellar polyethylene component was snapped into place of the same size. The patella tracked well with no touch technique. The tourniquet was let down and hemostasis achieved with electrocautery. The wound was then closed with a heavy Quill suture for the capsule, 2-0 Quill subcutaneously, and skin staples with deep drains placed exiting superolaterally.   IMPLANT:  Size 5 triathlon tibial bearing insert, type CS, 9 mm thickness.   DRAINS:  2.    COMPLICATIONS:  None.   ESTIMATED BLOOD LOSS:  100.   TOURNIQUET TIME:  45 minutes at 300 mmHg.   SPECIMEN:  Culture x 3.    ____________________________ Laurene Footman, MD [email or web address] D: 11/27/2013 22:19:15 ET T: 11/27/2013 23:06:44 ET JOB#: [id number]  cc: Laurene Footman, MD, <Dictator> Laurene Footman MD ELECTRONICALLY SIGNED 11/28/2013 8:37

## 2014-06-13 NOTE — Consult Note (Signed)
Admit Diagnosis:   S P TKA IRRIGATION DEBRIDEMENT RT: Onset Date: 29-Nov-2013, Status: Active, Description: S P TKA IRRIGATION DEBRIDEMENT RT   General Aspect 81 year old lady with h/o Hypertension, Atrial fibrillation, CAD, stage 4 CKD, hypothyroidism s/p right knee irrigation and debridement on 11/27/13 for septic knee. Wound cultures were obtained. Knee pain is controlled with pain meds. Patient denies any c/o chest pain, SOB, abdominal pain, nausea or vomiting. Husband at bedside.   Home Medications: Medication Instructions Status  acetaminophen 500 mg oral tablet 1 tab(s) orally every 4 hours, As needed, fever Active  oxyCODONE 5 mg oral tablet 1 tab(s) orally every 4 hours, As needed, moderate pain (4-6/10) Active  magnesium hydroxide 8% oral suspension 30 milliliter(s) orally 2 times a day, As needed, constipation Active  aluminum hydroxide/magnesium hydroxide/simethicone 400 mg-400 mg-40 mg/5 mL oral suspension 30 milliliter(s) orally every 6 hours, As needed, indigestion, heartburn Active  bisacodyl 10 mg rectal suppository 1 suppository(ies) rectal once a day, As needed, constipation if no results with MOM Active  docusate-senna 50 mg-8.6 mg oral tablet 1 tab(s) orally 2 times a day Active  amLODIPine 5 mg oral tablet 1 tab(s) orally once a day Active  enalapril 2.5 mg oral tablet 1 tab(s) orally once a day Active  levothyroxine 75 mcg (0.075 mg) oral tablet 1 tab(s) orally once a day Active  metoprolol 50 mg oral tablet 1 tab(s) orally once a day Active  tolterodine 4 mg oral capsule, extended release 1 cap(s) orally once a day Active  atorvastatin 20 mg oral tablet 1 tab(s) orally once a day (at bedtime) Active  Pradaxa 75 mg oral capsule 1 cap(s) orally 2 times a day Active  SandoSTATIN  intramuscular once monthly Active    Penicillin: Fever, Rash  Case History and Physical Exam:  Past Medical Health Hypertension, Atrial fibrillation, CAD, Stage 4 CKD, Hypothyroidism    Primary Care Provider [facility]   Family History Non-Contributory   HEENT PERLA   Neck/Nodes Supple  No Adenopathy   Chest/Lungs Clear  no rhonchi or crackles   Cardiovascular No Murmurs or Gallops  Normal Sinus Rhythm   Abdomen Benign  Active bowel sounds  soft, nontender   Neurological normal cranial nerves, moving all extremities   Skin normal turgor   Nursing/Ancillary Notes: **Vital Signs.:   10-Oct-15 11:03  Temperature Temperature (F) 98  Celsius 36.6  Pulse Pulse 70  Respirations Respirations 18  Systolic BP Systolic BP 416  Diastolic BP (mmHg) Diastolic BP (mmHg) 63  Mean BP 81  Pulse Ox % Pulse Ox % 91  *Intake and Output.:   Shift 10-Oct-15 15:00  Grand Totals Intake:  240 Output:      Net:  240 24 Hr.:  240  Oral Intake      In:  240  Length of Stay Totals Intake:  4189 Output:  1865    Net:  2324   Routine Micro:  08-Oct-15 12:47   Micro Text Report MRSA SCREEN, RT-PCR   COMMENT                   NEGATIVE - MRSA target DNA not detected   ANTIBIOTIC                       Comment 1 . NEGATIVE - MRSA target DNA not detected ------------------------------ Test procedure integrates sample purification, nucleic acid amplification, and detection of the target MRSA sequence in simple or complex samples  using real-time PCR and RT-PCRassays. This test is not intended to diagnose MRSA nor to guide or monitor treatment for MRSA infections. Concomitant cultures are necessary only to recover organisms for epidemiological typing or for further susceptibility testing.    14:04   Organism Name [place] NEGATIVE ROD  Organism Name GRAM NEGATIVE ROD  Micro Text Report WOUND AER/ANAEROBIC CULT   ORGANISM 1                LIGHT GROWTH GRAM NEGATIVE ROD   COMMENT                   ID TO FOLLOW SENSITIVITIES TO FOLLOW   GRAM STAIN                FEW WHITE BLOOD CELLS   GRAM STAIN                NO RED BLOOD CELLS   GRAM STAIN                NO  ORGANISMS SEEN   ANTIBIOTIC                       Micro Text Report WOUND AER/ANAEROBIC CULT   ORGANISM 1                LIGHT GROWTH GRAM NEGATIVE ROD   COMMENT                   ID TO FOLLOW SENSITIVITIES TO FOLLOW   GRAM STAIN                FEW WHITE BLOOD CELLS   GRAM STAIN                MODERATE RED BLOOD CELLS   GRAM STAIN                NO ORGANISMS SEEN   ANTIBIOTIC                       Specimen Source rt knee synovium tis  Specimen Source fluid right knee  Organism 1 LIGHT GROWTH GRAM NEGATIVE ROD  Organism 1 LIGHT GROWTH GRAM NEGATIVE ROD    16:28   Organism Name GRAM NEGATIVE ROD  Micro Text Report WOUND AER/ANAEROBIC CULT   ORGANISM 1                LIGHT GROWTH GRAM NEGATIVE ROD   COMMENT                   ID TO FOLLOW SENSITIVITIES TO FOLLOW   GRAM STAIN                RARE WHITE BLOOD CELLS   GRAM STAIN                RARE RED BLOOD CELLS   GRAM STAIN                NO ORGANISMS SEEN   ANTIBIOTIC                       Specimen Source rt knee  Organism 1 LIGHT GROWTH GRAM NEGATIVE ROD  Routine Chem:  08-Oct-15 12:37   Creatinine (comp)  2.56  eGFR (Non-African American)  19 (eGFR values <76m/min/1.73 m2 may be an indication of chronic kidney disease (CKD). Calculated eGFR, using the MRDR Study equation, is useful in  patients with stable renal function. The eGFR calculation will not be reliable in acutely ill patients when serum creatinine is changing rapidly. It is not useful in patients on dialysis. The eGFR calculation may not be applicable to patients at the low and high extremes of body sizes, pregnant women, and vetetarians.)  09-Oct-15 05:39   Glucose, Serum  111  BUN  41  Creatinine (comp)  2.50  Sodium, Serum  135  Potassium, Serum 4.5  Chloride, Serum 105  CO2, Serum  20  Calcium (Total), Serum  7.5  Anion Gap 10  Osmolality (calc) 281  eGFR (African American)  24  eGFR (Non-African American)  19 (eGFR values <54m/min/1.73 m2 may be  an indication of chronic kidney disease (CKD). Calculated eGFR, using the MRDR Study equation, is useful in  patients with stable renal function. The eGFR calculation will not be reliable in acutely ill patients when serum creatinine is changing rapidly. It is not useful in patients on dialysis. The eGFR calculation may not be applicable to patients at the low and high extremes of body sizes, pregnant women, and vetetarians.)  Routine Hem:  09-Oct-15 05:39   Hemoglobin (CBC)  7.9 (Result(s) reported on 28 Nov 2013 at 06:20AM.)  Platelet Count (CBC) 154 (Result(s) reported on 28 Nov 2013 at 06:20AM.)  10-Oct-15 04:00   Hemoglobin (CBC)  7.4   XRay:    08-Oct-15 13:25, Chest PA and Lateral  Chest PA and Lateral   REASON FOR EXAM:    htn,afib,cad,ckd  COMMENTS:       PROCEDURE: DXR - DXR CHEST PA (OR AP) AND LATERAL  - Nov 27 2013  1:25PM     CLINICAL DATA:  Preoperative knee surgery    EXAM:  CHEST  2 VIEW    COMPARISON:  None.    FINDINGS:  Lungs are clear. Heart size and pulmonary vascularity are normal. No  adenopathy. There is atherosclerotic change in the thoracic aorta.  There is a total shoulder replacement on the left. There is  postoperative change in the lumbar spine.     IMPRESSION:  No edema or consolidation.      Electronically Signed    By: WLowella GripM.D.    On: 11/27/2013 13:43         Verified By: WLeafy Kindle WJasmine December M.D.,    09-Oct-15 13:26, Chest Portable Single View  Chest Portable Single View   REASON FOR EXAM:    picc line placement  COMMENTS:       PROCEDURE: DXR - DXR PORTABLE CHEST SINGLE VIEW  - Nov 28 2013  1:26PM     CLINICAL DATA:  Central catheter placement    EXAM:  PORTABLE CHEST - 1 VIEW    COMPARISON:  November 27, 2013    FINDINGS:  Central catheter tip is in the superior vena cava. No pneumothorax.  There is no edema or consolidation. Heart size and pulmonary  vascularity are normal. There is atherosclerotic  change in the  aorta. No adenopathy. There is a total shoulder replacement on the  left.     IMPRESSION:  Central catheter tip in superior vena cava. No pneumothorax. No  edema or consolidation.      Electronically Signed    By: WLowella GripM.D.    On: 11/28/2013 13:39         Verified By: WLeafy Kindle WOODRUFF,M.D.,    Impression 81year old lady with h/o Hypertension, Paroxysmal atrial fibrillation, CAD, stage 4  CKD, hypothyroidism admitted for irrigation and debridement of septic right knee  Right knee septic arthritis: s/p irrigation and debridement on 11/27/13, continue IV Fortaz and Clindamycin. Preliminary wound cultures + light growth of gram negative rods, MRSA screen negative. Knee pain is controlled with current meds.   Stage 4 Chronic kidney disease: stable Cr=2.5 and eGFR 19.   Anemia: agree with transfusion for Hb 7.4 today, repeat Hb in AM. Normal platelet count noted.  Paroxysmal atrial fibrillation: currently in sinus rhythm, continue Metoprolol and Pradaxa. CAD: continue statin.  Hypertension is well controlled with Enalapril, Metoprolol and Amlodipine.  Hypothyroidism: continue usual dose of Synthroid.  Physical therapy.   Thanks for the consult. Shall follow with you.   Electronic Signatures: Glendon Axe (MD)  (Signed 10-Oct-15 11:20)  Authored: Health Issues, General Aspect/Present Illness, Home Medications, Allergies, History and Physical Exam, Vital Signs, Labs, Radiology, Impression/Plan   Last Updated: 10-Oct-15 11:20 by Glendon Axe (MD)

## 2014-06-13 NOTE — Discharge Summary (Signed)
PATIENT NAME:  Kerry Walsh, Kerry Walsh MR#:  [id number] DATE OF BIRTH:  02-06-1926  DATE OF ADMISSION:  12/31/2013 DATE OF DISCHARGE:  01/06/2014  DISCHARGE DIAGNOSES:  1. 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  2. Infected total knee joint replacement, on IV antibiotics as well as needing opioids. Likely precipitating above.  3. Weakness.  4. Respiratory insufficiency, likely from fluid overload from saline and sodium chloride treating syndrome of inappropriate antidiuretic hormone.   DISCHARGE MEDICATIONS: Per [facility] med reconciliation. Please see for details. He will be on Tolvaptan for a month, as well as sodium chloride tabs and torsemide for hyponatremia, which will need to be followed closely. Also, on Cipro per Dr. Ola Spurr to suppress Escherichia coli in the total joint.   HISTORY AND PHYSICAL: Please see detailed history and physical done on admission.   HOSPITAL COURSE: The patient was admitted with an extremely low sodium, approximately 110. She was given a fluid challenge and her sodium did not substantially increased with that. Urine electrolytes were checked nephrology was consulted. 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 She was put on sodium chloride tabs. She continued to get more short of breath. Lung V/Q scan was done which is negative. Did seem to have fluid overload on chest x-ray. The torsemide was begun which seemed to help her sodium as well. Tolvaptan was eventually started by nephrology. Her sodium is much improved up to approximately 129 today. It was recommended by myself and physical  therapy care management, that the patient go to rehab for further strengthening and monitoring her sodium etc. However, she declined that mainly because she was worried about her husband's care at home. She will have home health set up who had been following her. She will see nephrology soon, as  well as infectious disease and myself.   TIME SPENT: It took approximately 35 minutes to do all discharge tasks.    ____________________________ Ocie Cornfield. Ouida Sills, MD [id number] D: 01/06/2014 13:42:52 ET T: 01/06/2014 15:23:31 ET JOB#: [id number]  cc: Ocie Cornfield. Ouida Sills, MD, <Dictator> Kirk Ruths MD ELECTRONICALLY SIGNED 01/07/2014 16:39

## 2014-06-13 NOTE — Consult Note (Signed)
General Aspect 81 year old lady with h/o Hypertension, Atrial fibrillation, CAD, stage 4 CKD, hypothyroidism s/p right knee irrigation and debridement yesterday for septic knee. Wound cultures were obtained. Knee pain is controlled with pain meds. Patient denies any c/o chest pain, SOB, abdominal pain, nausea or vomiting. Husband at bedside.   Home Medications: Medication Instructions Status  acetaminophen 500 mg oral tablet 1 tab(s) orally every 4 hours, As needed, fever Active  oxyCODONE 5 mg oral tablet 1 tab(s) orally every 4 hours, As needed, moderate pain (4-6/10) Active  magnesium hydroxide 8% oral suspension 30 milliliter(s) orally 2 times a day, As needed, constipation Active  bisacodyl 10 mg rectal suppository 1 suppository(ies) rectal once a day, As needed, constipation if no results with MOM Active  aluminum hydroxide/magnesium hydroxide/simethicone 400 mg-400 mg-40 mg/5 mL oral suspension 30 milliliter(s) orally every 6 hours, As needed, indigestion, heartburn Active  docusate-senna 50 mg-8.6 mg oral tablet 1 tab(s) orally 2 times a day Active  atorvastatin 20 mg oral tablet 1 tab(s) orally once a day (at bedtime) Active  tolterodine 4 mg oral capsule, extended release 1 cap(s) orally once a day Active  Pradaxa 75 mg oral capsule 1 cap(s) orally 2 times a day Active  enalapril 2.5 mg oral tablet 1 tab(s) orally once a day Active  SandoSTATIN  intramuscular once monthly Active  amLODIPine 5 mg oral tablet 1 tab(s) orally once a day Active  metoprolol 50 mg oral tablet 1 tab(s) orally once a day Active  levothyroxine 75 mcg (0.075 mg) oral tablet 1 tab(s) orally once a day Active    Penicillin: Fever, Rash  Case History and Physical Exam:  Past Medical Health Hypertension, Atrial fibrillation, CAD, Stage 4 CKD, Hypothyroidism   Primary Care Provider [facility]   Family History Non-Contributory   HEENT PERLA   Neck/Nodes Supple  No Adenopathy    Chest/Lungs Clear  no rhonchi or crackles   Cardiovascular Irregular Rate and Rhythm   Abdomen Benign  Active bowel sounds  soft, nontender   Neurological normal cranial nerves, moving all extremities   Skin normal turgor   Nursing/Ancillary Notes: **Vital Signs.:   09-Oct-15 13:56  Vital Signs Type Q 4hr  Temperature Temperature (F) 97.6  Celsius 36.4  Temperature Source oral  Pulse Pulse 63  Respirations Respirations 18  Systolic BP Systolic BP 580  Diastolic BP (mmHg) Diastolic BP (mmHg) 50  Mean BP 66  Pulse Ox % Pulse Ox % 90  Pulse Ox Activity Level  At rest  Oxygen Delivery Room Air/ 21 %   Routine Micro:  08-Oct-15 12:47   Micro Text Report MRSA SCREEN, RT-PCR   COMMENT                   NEGATIVE - MRSA target DNA not detected   ANTIBIOTIC                       Comment 1 . NEGATIVE - MRSA target DNA not detected ------------------------------ Test procedure integrates sample purification, nucleic acid amplification, and detection of the target MRSA sequence in simple or complex samples using real-time PCR and RT-PCRassays. This test is not intended to diagnose MRSA nor to guide or monitor treatment for MRSA infections. Concomitant cultures are necessary only to recover organisms for epidemiological typing or for further susceptibility testing.    14:04   Organism Name [place] NEGATIVE ROD  Organism Name [place] NEGATIVE ROD  Micro Text Report  WOUND AER/ANAEROBIC CULT   ORGANISM 1                LIGHT GROWTH GRAM NEGATIVE ROD   COMMENT                   ID TO FOLLOW SENSITIVITIES TO FOLLOW   GRAM STAIN                FEW WHITE BLOOD CELLS   GRAM STAIN                NO RED BLOOD CELLS   GRAM STAIN                NO ORGANISMS SEEN   ANTIBIOTIC                       Micro Text Report WOUND AER/ANAEROBIC CULT   ORGANISM 1                LIGHT GROWTH GRAM NEGATIVE ROD   COMMENT                   ID TO FOLLOW SENSITIVITIES TO FOLLOW   GRAM STAIN                FEW  WHITE BLOOD CELLS   GRAM STAIN                MODERATE RED BLOOD CELLS   GRAM STAIN                NO ORGANISMS SEEN   ANTIBIOTIC                       Specimen Source rt knee synovium tis  Specimen Source fluid right knee  Organism 1 LIGHT GROWTH GRAM NEGATIVE ROD  Organism 1 LIGHT GROWTH GRAM NEGATIVE ROD    16:28   Organism Name GRAM NEGATIVE ROD  Micro Text Report WOUND AER/ANAEROBIC CULT   ORGANISM 1                LIGHT GROWTH GRAM NEGATIVE ROD   COMMENT                   ID TO FOLLOW SENSITIVITIES TO FOLLOW   GRAM STAIN                RARE WHITE BLOOD CELLS   GRAM STAIN                RARE RED BLOOD CELLS   GRAM STAIN                NO ORGANISMS SEEN   ANTIBIOTIC                       Specimen Source rt knee  Organism 1 LIGHT GROWTH GRAM NEGATIVE ROD  Routine Chem:  08-Oct-15 12:37   Creatinine (comp)  2.56  eGFR (Non-African American)  19 (eGFR values <22m/min/1.73 m2 may be an indication of chronic kidney disease (CKD). Calculated eGFR, using the MRDR Study equation, is useful in  patients with stable renal function. The eGFR calculation will not be reliable in acutely ill patients when serum creatinine is changing rapidly. It is not useful in patients on dialysis. The eGFR calculation may not be applicable to patients at the low and high extremes of body sizes, pregnant women, and vetetarians.)  09-Oct-15 05:39   Glucose,  Serum  111  BUN  41  Creatinine (comp)  2.50  Sodium, Serum  135  Potassium, Serum 4.5  Chloride, Serum 105  CO2, Serum  20  Calcium (Total), Serum  7.5  Anion Gap 10  Osmolality (calc) 281  eGFR (African American)  24  eGFR (Non-African American)  19 (eGFR values <27m/min/1.73 m2 may be an indication of chronic kidney disease (CKD). Calculated eGFR, using the MRDR Study equation, is useful in  patients with stable renal function. The eGFR calculation will not be reliable in acutely ill patients when serum creatinine is changing  rapidly. It is not useful in patients on dialysis. The eGFR calculation may not be applicable to patients at the low and high extremes of body sizes, pregnant women, and vetetarians.)  Routine Hem:  09-Oct-15 05:39   Hemoglobin (CBC)  7.9 (Result(s) reported on 28 Nov 2013 at 06:20AM.)  Platelet Count (CBC) 154 (Result(s) reported on 28 Nov 2013 at 06:20AM.)   XRay:    08-Oct-15 13:25, Chest PA and Lateral  Chest PA and Lateral   REASON FOR EXAM:    htn,afib,cad,ckd  COMMENTS:       PROCEDURE: DXR - DXR CHEST PA (OR AP) AND LATERAL  - Nov 27 2013  1:25PM     CLINICAL DATA:  Preoperative knee surgery    EXAM:  CHEST  2 VIEW    COMPARISON:  None.    FINDINGS:  Lungs are clear. Heart size and pulmonary vascularity are normal. No  adenopathy. There is atherosclerotic change in the thoracic aorta.  There is a total shoulder replacement on the left. There is  postoperative change in the lumbar spine.     IMPRESSION:  No edema or consolidation.      Electronically Signed    By: WLowella GripM.D.    On: 11/27/2013 13:43         Verified By: WLeafy Kindle WJasmine December M.D.,    09-Oct-15 13:26, Chest Portable Single View  Chest Portable Single View   REASON FOR EXAM:    picc line placement  COMMENTS:       PROCEDURE: DXR - DXR PORTABLE CHEST SINGLE VIEW  - Nov 28 2013  1:26PM     CLINICAL DATA:  Central catheter placement    EXAM:  PORTABLE CHEST - 1 VIEW    COMPARISON:  November 27, 2013    FINDINGS:  Central catheter tip is in the superior vena cava. No pneumothorax.  There is no edema or consolidation. Heart size and pulmonary  vascularity are normal. There is atherosclerotic change in the  aorta. No adenopathy. There is a total shoulder replacement on the  left.     IMPRESSION:  Central catheter tip in superior vena cava. No pneumothorax. No  edema or consolidation.      Electronically Signed    By: WLowella GripM.D.    On: 11/28/2013 13:39          Verified By: WLeafy Kindle WOODRUFF,M.D.,    Impression 81year old lady with h/o Hypertension, Atrial fibrillation, CAD, stage 4 CKD, hypothyroidism admitted for irrigation and debridement of septic right knee  Right knee septic arthritis: s/p irrigation and debridement yesterday, continue IV Fortaz and Clindamycin. Preliminary wound cultures + light growth of gram negative rods, MRSA screen negative. Knee pain is controlled with current meds.   Stage 4 Chronic kidney disease: stable Cr=2.5 and eGFR 19 today.   Anemia: Hb 7.9 today, shall monitor. Normal  platelet count noted.  Atrial fibrillation: rate controlled, continue Metoprolol and Pradaxa. CAD: continue statin.  Hypertension is well controlled with Enalapril, Metoprolol and Amlodipine.  Hypothyroidism: continue usual dose of Synthroid.  Physical therapy.   Thanks for the consult. Shall follow with you.   Electronic Signatures: Glendon Axe (MD)  (Signed 09-Oct-15 19:21)  Authored: General Aspect/Present Illness, Home Medications, Allergies, History and Physical Exam, Vital Signs, Labs, Radiology, Impression/Plan   Last Updated: 09-Oct-15 19:21 by Glendon Axe (MD)

## 2014-06-13 NOTE — H&P (Signed)
PATIENT NAME:  Kerry Walsh, Kerry Walsh MR#:  [id number] DATE OF BIRTH:  [birth year]  DATE OF ADMISSION:  12/31/2013  PRIMARY CARE PHYSICIAN: Ocie Cornfield. Ouida Sills, MD of [facility].   CHIEF COMPLAINT: Weakness.   HISTORY OF PRESENT ILLNESS: An 81 year old female who was admitted a few weeks ago for her infected knee prosthesis and I and D done by orthopedic surgery. She was  discharged home with  IV cefazolin with a PICC line. She is still taking that and she was discharged home with physical therapy arrangement. Lives with her husband and her family lives nearby. Initially for 1 to 2 weeks she did good with physical therapy and overall improvement, but then slowly she had decreased appetite and not feeling too good to eat, feeling somewhat nauseous sometimes so decreased eating and gradually getting weaker and weaker now for almost 1 to 2 weeks. Her daughter took her to primary care physician's office yesterday and she had all the workup done and sent home, which is reported to be having hyponatremia and so she was called in and told to come to Emergency Room immediately for getting admitted. On checking in the ER, her sodium level is 120 and she is given admission to the hospitalist team.   REVIEW OF SYSTEMS: CONSTITUTIONAL: Negative for fever, positive of fatigue and generalized weakness. No weight loss or weight gain.  EYES: No blurring, double vision, discharge or redness.  EARS, NOSE, THROAT: No tinnitus, ear pain or hearing loss.  RESPIRATORY: No cough, wheezing, hemoptysis, or shortness of breath.  CARDIOVASCULAR: No chest pain, orthopnea, edema, arrhythmia, palpitation.  GASTROINTESTINAL: No nausea, vomiting, diarrhea, abdominal pain but has decreased appetite.  GENITOURINARY: No dysuria, hematuria, increased frequency.  ENDOCRINE: No heat or cold intolerance. No excessive sweating.  SKIN: No acne, rashes, or lesions.  MUSCULOSKELETAL: No pain or swelling in the joints.  NEUROLOGICAL: No  numbness, weakness, tremor or vertigo.  PSYCHIATRIC: No anxiety, insomnia, bipolar disorder.   PAST MEDICAL HISTORY: 1.  Hypertension.  2.  Coronary artery disease.  3.  Chronic kidney disease.   4.  Atrial fibrillation.  5.  Osteoarthritis.  6.  Cholelithiasis.  7.  Chronic tumor or ovary with removal in 2011.  8.  Osteoarthritis.  9.  Hypothyroid.  10.   Osteoporosis, postmenopausal.  11.   Cataract, chronic senile.  12.   Stroke.  13.   Anemia.  14.   Migraine.   PAST SURGICAL HISTORY:  1.  Joint replacement, right total knee replacement in 2013. Left total knee replacement in 2011, left total hip replacement.  2.  Back surgeries, L3-L5 fusion.  3.  Hemorrhoidectomy, external.   4.  Hysterectomy.  5.  Irrigation and debridement of septic knee with exchange of polyethylene in October 2015.   SOCIAL HISTORY: She lives with her husband, walks with a walker. No smoking. No drinking. No illegal drug use.   FAMILY HISTORY: One sister had leukemia, the other had pancreatic cancer.   HOME MEDICATIONS: 1.  Tramadol 50 mg oral tablet every 6 hours as needed for pain.  2.  Tolterodine 4 mg oral extended-release once a day.  3.  Sandostatin intramuscular injection once a month.  4.  Pradaxa 75 mg oral capsule 2 times a day.  5.  Metoprolol 50 mg oral once a day.  6.  Levothyroxine 75 mcg once a day.  7.  Enalapril 2.5 mg oral once a day.  8.  Docusate and senna 2 times a day as needed for  constipation.  9.  Ceftazidime 0.5 grams via PICC line every 24 hours.  10.   Atorvastatin 20 mg once a day.  11.   Amlodipine 5 mg once a day.  12.   Acetaminophen 500 mg oral tablet every 4 hours as needed for pain and fever.   PHYSICAL EXAMINATION: VITAL SIGNS: In ER, temperature 97.9, pulse 62, respirations 18, blood pressure 167/69, and pulse oximetry is 97% on room air.  GENERAL: The patient is fully alert and oriented to time, place, and person. Does not appear in any acute distress.   HEENT: Head and neck atraumatic. Conjunctivae pink. Oral mucosa moist.  NECK: Supple. No JVD.  RESPIRATORY: Bilateral equal and clear air entry.  CARDIOVASCULAR: S1, S2 present, regular. No murmur.  ABDOMEN: Soft, nontender. Bowel sounds present. No organomegaly.  SKIN: No acne, rashes, or lesions.  LEGS: No edema.  JOINTS: No swelling or tenderness.  NEUROLOGICAL: Power 4/5. Follows commands. Moves all 4 limbs. No tremor or rigidity.  PSYCHIATRIC: No anxiety, insomnia, bipolar disorder. Does not appear in any acute psychiatric illness at this time.   IMPORTANT LABORATORY RESULTS: Glucose 106, BUN 21, creatinine 1.65, sodium 120, potassium is 4.8, chloride is 86, CO2 is 26, calcium is 7.4. Total protein 5.9, albumin 2.6, bilirubin 0.7, alkaline phosphatase 116, SGOT 25, and SGPT is 14. WBC 8.7, hemoglobin 9, platelet count 277,000, MCV is 85.   ASSESSMENT AND PLAN: An 81 year old female who had recent admission for infected right knee prosthesis and had incision and drainage done and using IV antibiotic at home, came to Emergency Room as was told by PMD who saw her yesterday for generalized weakness, and on blood work found having hyponatremia.  1.  Hyponatremia and generalized weakness. This is most likely due to her decreased appetite and decreased oral intake. Will give IV normal saline and will check tomorrow her BNP level. I encouraged her and her daughter to try giving puree, fruit juice, Ensure, Boost or other supplements to give her enough nutrition if she is not eating enough. I will start on megestrol to boost up her eating. Will check TSH level to find out any other reason for her hyponatremia. She is not on any medication which can apparently cause hyponatremia.  2.  Infected joint status post incision and drainage, on IV antibiotic. Will continue ceftazidime as she was taking at home, as per her home list.  3.  Hypertension. We will continue enalapril and metoprolol as she was taking  at home.  4.  Atrial fibrillation. Continue for Pradaxa as she was taking at home.  5.  Chronic kidney disease, appears to be stable.   TOTAL TIME SPENT ON THIS ADMISSION:  50 minutes.    ____________________________ Ceasar Lund Anselm Jungling, MD vgv:at D: 12/31/2013 16:43:33 ET T: 12/31/2013 17:04:06 ET JOB#: [id number]  cc: Ceasar Lund. Anselm Jungling, MD, <Dictator> Ocie Cornfield. Ouida Sills, MD Vaughan Basta MD ELECTRONICALLY SIGNED 01/19/2014 8:52

## 2014-06-13 NOTE — Consult Note (Signed)
General Aspect 81 year old lady with h/o Hypertension, Atrial fibrillation, CAD, stage 4 CKD, hypothyroidism s/p right knee irrigation and debridement on 11/27/13 for septic knee. Wound cultures were obtained. Knee pain is controlled with pain meds. She is sitting up in a chair. She walked 40-45 feet with PT today. Denies any c/o chest pain, SOB, abdominal pain, nausea or vomiting. Husband at bedside.    Penicillin: Fever, Rash  Case History and Physical Exam:  Past Medical Health Hypertension, Atrial fibrillation, CAD, Stage 4 CKD, Hypothyroidism   Primary Care Provider [facility]   Family History Non-Contributory   HEENT PERLA   Neck/Nodes Supple  No Adenopathy   Chest/Lungs Clear  no rhonchi or crackles   Cardiovascular No Murmurs or Gallops  Normal Sinus Rhythm   Abdomen Benign  Active bowel sounds  soft, nontender, no hepatosplenomegaly   Neurological normal cranial nerves, moving all extremities   Skin normal turgor   Nursing/Ancillary Notes: **Vital Signs.:   11-Oct-15 07:11  Vital Signs Type Routine  Temperature Temperature (F) 98.6  Celsius 37  Temperature Source oral  Pulse Pulse 71  Respirations Respirations 16  Systolic BP Systolic BP 834  Diastolic BP (mmHg) Diastolic BP (mmHg) 61  Mean BP 81  Pulse Ox % Pulse Ox % 95  Pulse Ox Activity Level  At rest  Oxygen Delivery Room Air/ 21 %  *Intake and Output.:   Shift 11-Oct-15 15:00  Grand Totals Intake:  240 Output:      Net:  240 24 Hr.:  240  Oral Intake      In:  240  Length of Stay Totals Intake:  5914 Output:  1865    Net:  4049   Routine Chem:  08-Oct-15 12:37   Creatinine (comp)  2.56  Sodium, Serum  134  eGFR (Non-African American)  19 (eGFR values <59m/min/1.73 m2 may be an indication of chronic kidney disease (CKD). Calculated eGFR, using the MRDR Study equation, is useful in  patients with stable renal function. The eGFR calculation will not be reliable in  acutely ill patients when serum creatinine is changing rapidly. It is not useful in patients on dialysis. The eGFR calculation may not be applicable to patients at the low and high extremes of body sizes, pregnant women, and vetetarians.)  09-Oct-15 05:39   Creatinine (comp)  2.50  Sodium, Serum  135  eGFR (Non-African American)  19 (eGFR values <648mmin/1.73 m2 may be an indication of chronic kidney disease (CKD). Calculated eGFR, using the MRDR Study equation, is useful in  patients with stable renal function. The eGFR calculation will not be reliable in acutely ill patients when serum creatinine is changing rapidly. It is not useful in patients on dialysis. The eGFR calculation may not be applicable to patients at the low and high extremes of body sizes, pregnant women, and vetetarians.)  11-Oct-15 04:42   Result Comment LABS - This specimen was collected through an   - indwelling catheter or arterial line.  - A minimum of 49m149mof blood was wasted prior    - to collecting the sample.  Interpret  - results with caution.  Result(s) reported on 30 Nov 2013 at 05:04AM.  Glucose, Serum  117  BUN  31  Creatinine (comp)  2.38  Sodium, Serum  132  Potassium, Serum 4.5  Chloride, Serum 104  CO2, Serum 21  Calcium (Total), Serum  7.3  Anion Gap 7  Osmolality (calc) 272  eGFR (African American)  25  eGFR (Non-African American)  21 (eGFR values <66m/min/1.73 m2 may be an indication of chronic kidney disease (CKD). Calculated eGFR, using the MRDR Study equation, is useful in  patients with stable renal function. The eGFR calculation will not be reliable in acutely ill patients when serum creatinine is changing rapidly. It is not useful in patients on dialysis. The eGFR calculation may not be applicable to patients at the low and high extremes of body sizes, pregnant women, and vetetarians.)  Routine Hem:  11-Oct-15 04:42   Hemoglobin (CBC)  8.4   XRay:    08-Oct-15 13:25,  Chest PA and Lateral  Chest PA and Lateral   REASON FOR EXAM:    htn,afib,cad,ckd  COMMENTS:       PROCEDURE: DXR - DXR CHEST PA (OR AP) AND LATERAL  - Nov 27 2013  1:25PM     CLINICAL DATA:  Preoperative knee surgery    EXAM:  CHEST  2 VIEW    COMPARISON:  None.    FINDINGS:  Lungs are clear. Heart size and pulmonary vascularity are normal. No  adenopathy. There is atherosclerotic change in the thoracic aorta.  There is a total shoulder replacement on the left. There is  postoperative change in the lumbar spine.     IMPRESSION:  No edema or consolidation.      Electronically Signed    By: WLowella GripM.D.    On: 11/27/2013 13:43         Verified By: WLeafy Kindle WJasmine December M.D.,    09-Oct-15 13:26, Chest Portable Single View  Chest Portable Single View   REASON FOR EXAM:    picc line placement  COMMENTS:       PROCEDURE: DXR - DXR PORTABLE CHEST SINGLE VIEW  - Nov 28 2013  1:26PM     CLINICAL DATA:  Central catheter placement    EXAM:  PORTABLE CHEST - 1 VIEW    COMPARISON:  November 27, 2013    FINDINGS:  Central catheter tip is in the superior vena cava. No pneumothorax.  There is no edema or consolidation. Heart size and pulmonary  vascularity are normal. There is atherosclerotic change in the  aorta. No adenopathy. There is a total shoulder replacement on the  left.     IMPRESSION:  Central catheter tip in superior vena cava. No pneumothorax. No  edema or consolidation.      Electronically Signed    By: WLowella GripM.D.    On: 11/28/2013 13:39         Verified By: WLeafy Kindle WOODRUFF,M.D.,  Cardiology:    08-Oct-15 13:10, ECG  ECG interpretation   Normal sinus rhythm  Left axis deviation  Left bundle branch block  Abnormal ECG  No previous ECGs available  Confirmed by GRockey Situ TIMOTHY ([id number]) on 11/29/2013 10:28:32 AM    Overreader: GIda Rogue   Impression 81year old lady with h/o Hypertension, Paroxysmal atrial  fibrillation, CAD, stage 4 CKD, hypothyroidism admitted for irrigation and debridement of septic right knee  Right knee septic arthritis: s/p irrigation and debridement on 11/27/13, continue IV Fortaz and Clindamycin. Preliminary wound cultures + E.coli, MRSA screen negative. Knee pain is controlled with current meds.   Stage 4 Chronic kidney disease: stable Cr=2.38 and eGFR 21.   Anemia: Hb improved to 8.4 post transfusion.   Paroxysmal atrial fibrillation: currently in sinus rhythm, continue Metoprolol and Pradaxa. CAD: continue statin.  Hypertension is well controlled with Enalapril, Metoprolol and  Amlodipine.  Hypothyroidism: continue usual dose of Synthroid.  Physical therapy.   Thanks for the consult. Shall follow with you.   Electronic Signatures: Glendon Axe (MD)  (Signed 11-Oct-15 13:01)  Authored: General Aspect/Present Illness, Allergies, History and Physical Exam, Vital Signs, Labs, Radiology, Impression/Plan   Last Updated: 11-Oct-15 13:01 by Glendon Axe (MD)

## 2014-06-13 NOTE — Discharge Summary (Signed)
PATIENT NAME:  Kerry Walsh, COGLIANO MR#:  [id number] DATE OF BIRTH:  1926-01-09  DATE OF ADMISSION:  11/27/2013 DATE OF DISCHARGE:  12/01/2013  ADMITTING DIAGNOSIS: Acute infection right total knee.   DISCHARGE DIAGNOSIS: Acute infection right total knee.   PROCEDURE: Irrigation and debridement septic knee with exchange of polyethylene.   ANESTHESIA: General.   SURGEON: Laurene Footman, MD.   IMPLANTS: Size 5 Triathlon tibial bearing insert type CS 9 mm thickness, drains 2.   COMPLICATIONS: None.   ESTIMATED BLOOD LOSS: 100 mL.    TOURNIQUET TIME: 45 minutes at 300 mmHg.   SPECIMEN: Culture x 3.   HISTORY: The patient was seen in clinic on 11/26/2013 as a result by Mortimer Fries, PA-C for right knee pain. The patient had swelling and warmth that occurred 1 day prior on 11/25/2013. She had a prior total knee arthroplasty 04/13 in [place]. She describes the pain as debilitating. Symptoms had been present for 1 day and began during activity. The patient denies any trauma. The pain is constant. The whole knee is painful. She has tried no medications. The patient has tried no modalities to help with the pain. Pain is relieved by nothing adequately described by patient.   PHYSICAL EXAMINATION:  GENERAL: No apparent distress, well nourished, and well-developed.  EYES: Pupils equal, round, and reactive to light.  HEART: Regular rate and rhythm.  LUNGS: Clear to auscultation bilaterally. No wheezing, rales, or rhonchi.  EXTREMITIES:  Gait nonweightbearing, alignment normal. Right knee has mild swelling with surgical scar present. There is mild crepitus. There is a severe effusion. She has flexion to 45 degrees and extension to 10 degrees. Strength is 4 out of 5 with flexion and extension. She has no valgus or varus instability. Reflexes are normal. She has 2 + dorsalis pedis pulse.  She is neurovascularly intact in bilateral lower extremities.   HOSPITAL COURSE: The patient was admitted to the  hospital on 11/27/2013. She had surgery that same day and was brought to the orthopedic floor from the PACU in stable condition. On postoperative day 1 the patient had a PICC line placed. Medicine was consulted for stage IV kidney disease, atrial fibrillation, and hypertension. The patient's laboratories remained stable although she did have a slight drop in her hemoglobin to 7.9 on postoperative day 1. On 11/29/2013, postoperative day 2, she made slow progress with physical therapy and hemoglobin dropped down to 7.4. She was given 1 unit of packed red blood cells. Vital signs remained stable. On 11/30/2013 hemoglobin was up to 8.4. All other medical conditions were stable. She was doing well and made good progress with physical therapy. On postoperative day 4, 12/01/2013, the patient's cultures showed positive gram-negative rods. She was started on IV antibiotics. The patient was doing well. She was stable and ready for discharge to home with home health PT.   CONDITION AT DISCHARGE: Stable.   DISCHARGE INSTRUCTIONS: The patient may gradually increase weightbearing on the affected extremity. Thigh-high TED hose on both legs and remove at bedtime, replace on arising the next morning. Elevate the heels off the bed. Incentive spirometer every hour while awake. Encourage cough and deep breathing. The patient may resume a regular diet as tolerated. Do not get the dressing or bandage wet or dirty. Call [facility] orthopedics if the dressing gets water under it. Change the dressing once daily as needed, call [facility] orthopedics if any of the following occur: Bright red bleeding from the incisional wound, fever above 101.5 degrees, redness, swelling,  or drainage at the incision site. Call [facility] orthopedics if you experience any increased leg pain, numbness or weakness in your legs or bowel or bladder symptoms. The patient needs to follow up with [facility] orthopedics in 2 weeks.   Please see discharge instructions for list of discharge  medications.    ____________________________ T. Rachelle Hora, PA-C tcg:bu D: 12/01/2013 14:14:08 ET T: 12/01/2013 15:09:54 ET JOB#: [id number]  cc: T. Rachelle Hora, PA-C, <Dictator> Duanne Guess [place] ELECTRONICALLY SIGNED 01/06/2014 12:41

## 2014-06-13 NOTE — Consult Note (Signed)
Brief Consult Note: Diagnosis: recent infected total knee.   Patient was seen by consultant.   Orders entered.   Comments: will resume PT as tolerated by medical condition. no evidence of recurrent infection continue iv antibiotics.  Electronic Signatures: Laurene Footman (MD)  (Signed [phone] 16:33)  Authored: Brief Consult Note   Last Updated: 12-Nov-15 16:33 by Laurene Footman (MD)

## 2014-06-28 ENCOUNTER — Encounter: Payer: Self-pay | Admitting: General Practice

## 2014-06-28 ENCOUNTER — Observation Stay
Admission: EM | Admit: 2014-06-28 | Discharge: 2014-06-29 | Disposition: A | Discharge status: Home / Self Care | Payer: Medicare Other | Attending: Internal Medicine | Admitting: Internal Medicine

## 2014-06-28 ENCOUNTER — Emergency Department: Payer: Medicare Other

## 2014-06-28 DIAGNOSIS — I10 Essential (primary) hypertension: Secondary | ICD-10-CM | POA: Diagnosis not present

## 2014-06-28 DIAGNOSIS — R748 Abnormal levels of other serum enzymes: Secondary | ICD-10-CM | POA: Diagnosis not present

## 2014-06-28 DIAGNOSIS — R42 Dizziness and giddiness: Secondary | ICD-10-CM | POA: Insufficient documentation

## 2014-06-28 DIAGNOSIS — Z96653 Presence of artificial knee joint, bilateral: Secondary | ICD-10-CM | POA: Insufficient documentation

## 2014-06-28 DIAGNOSIS — E222 Syndrome of inappropriate secretion of antidiuretic hormone: Secondary | ICD-10-CM | POA: Insufficient documentation

## 2014-06-28 DIAGNOSIS — Z859 Personal history of malignant neoplasm, unspecified: Secondary | ICD-10-CM | POA: Insufficient documentation

## 2014-06-28 DIAGNOSIS — R531 Weakness: Secondary | ICD-10-CM | POA: Insufficient documentation

## 2014-06-28 DIAGNOSIS — M79602 Pain in left arm: Secondary | ICD-10-CM | POA: Insufficient documentation

## 2014-06-28 DIAGNOSIS — Z86011 Personal history of benign neoplasm of the brain: Secondary | ICD-10-CM | POA: Insufficient documentation

## 2014-06-28 DIAGNOSIS — Z9049 Acquired absence of other specified parts of digestive tract: Secondary | ICD-10-CM | POA: Insufficient documentation

## 2014-06-28 DIAGNOSIS — Z8673 Personal history of transient ischemic attack (TIA), and cerebral infarction without residual deficits: Secondary | ICD-10-CM | POA: Diagnosis not present

## 2014-06-28 DIAGNOSIS — Z90710 Acquired absence of both cervix and uterus: Secondary | ICD-10-CM | POA: Insufficient documentation

## 2014-06-28 DIAGNOSIS — I351 Nonrheumatic aortic (valve) insufficiency: Secondary | ICD-10-CM | POA: Diagnosis not present

## 2014-06-28 DIAGNOSIS — I4891 Unspecified atrial fibrillation: Secondary | ICD-10-CM | POA: Diagnosis not present

## 2014-06-28 DIAGNOSIS — I129 Hypertensive chronic kidney disease with stage 1 through stage 4 chronic kidney disease, or unspecified chronic kidney disease: Secondary | ICD-10-CM | POA: Insufficient documentation

## 2014-06-28 DIAGNOSIS — N189 Chronic kidney disease, unspecified: Secondary | ICD-10-CM

## 2014-06-28 DIAGNOSIS — N184 Chronic kidney disease, stage 4 (severe): Secondary | ICD-10-CM | POA: Insufficient documentation

## 2014-06-28 DIAGNOSIS — Z8249 Family history of ischemic heart disease and other diseases of the circulatory system: Secondary | ICD-10-CM | POA: Diagnosis not present

## 2014-06-28 DIAGNOSIS — Z880 Allergy status to penicillin: Secondary | ICD-10-CM | POA: Diagnosis not present

## 2014-06-28 DIAGNOSIS — I5189 Other ill-defined heart diseases: Secondary | ICD-10-CM | POA: Insufficient documentation

## 2014-06-28 DIAGNOSIS — I340 Nonrheumatic mitral (valve) insufficiency: Secondary | ICD-10-CM | POA: Diagnosis not present

## 2014-06-28 DIAGNOSIS — R002 Palpitations: Secondary | ICD-10-CM | POA: Diagnosis not present

## 2014-06-28 DIAGNOSIS — G9389 Other specified disorders of brain: Secondary | ICD-10-CM | POA: Insufficient documentation

## 2014-06-28 DIAGNOSIS — N179 Acute kidney failure, unspecified: Secondary | ICD-10-CM | POA: Diagnosis not present

## 2014-06-28 HISTORY — DX: Essential (primary) hypertension: I10

## 2014-06-28 HISTORY — DX: Chronic kidney disease, unspecified: N18.9

## 2014-06-28 HISTORY — DX: Cerebral infarction, unspecified: I63.9

## 2014-06-28 HISTORY — DX: Malignant (primary) neoplasm, unspecified: C80.1

## 2014-06-28 HISTORY — DX: Cardiac arrhythmia, unspecified: I49.9

## 2014-06-28 LAB — BASIC METABOLIC PANEL
Anion gap: 8 (ref 5–15)
BUN: 53 mg/dL — AB (ref 6–20)
CHLORIDE: 105 mmol/L (ref 101–111)
CO2: 26 mmol/L (ref 22–32)
Calcium: 8.7 mg/dL — ABNORMAL LOW (ref 8.9–10.3)
Creatinine, Ser: 2.91 mg/dL — ABNORMAL HIGH (ref 0.44–1.00)
GFR calc Af Amer: 16 mL/min — ABNORMAL LOW (ref >60)
GFR calc non Af Amer: 14 mL/min — ABNORMAL LOW (ref >60)
GLUCOSE: 102 mg/dL — AB (ref 65–99)
Potassium: 4.3 mmol/L (ref 3.5–5.1)
SODIUM: 139 mmol/L (ref 135–145)

## 2014-06-28 LAB — URINALYSIS COMPLETE WITH MICROSCOPIC (ARMC ONLY)
BILIRUBIN URINE: NEGATIVE
Bacteria, UA: NONE SEEN
GLUCOSE, UA: NEGATIVE mg/dL
Hgb urine dipstick: NEGATIVE
Ketones, ur: NEGATIVE mg/dL
LEUKOCYTES UA: NEGATIVE
NITRITE: NEGATIVE
PH: 7 (ref 5.0–8.0)
PROTEIN: 100 mg/dL — AB
Specific Gravity, Urine: 1.011 (ref 1.005–1.030)

## 2014-06-28 LAB — CBC
HCT: 30.7 % — ABNORMAL LOW (ref 35.0–47.0)
Hemoglobin: 10 g/dL — ABNORMAL LOW (ref 12.0–16.0)
MCH: 28.8 pg (ref 26.0–34.0)
MCHC: 32.7 g/dL (ref 32.0–36.0)
MCV: 88.2 fL (ref 80.0–100.0)
Platelets: 172 10*3/uL (ref 150–440)
RBC: 3.48 MIL/uL — AB (ref 3.80–5.20)
RDW: 15.4 % — ABNORMAL HIGH (ref 11.5–14.5)
WBC: 5.6 10*3/uL (ref 3.6–11.0)

## 2014-06-28 LAB — CK TOTAL AND CKMB (NOT AT ARMC)
CK TOTAL: 79 U/L (ref 38–234)
CK, MB: 1.4 ng/mL (ref 0.5–5.0)
CK, MB: 2.4 ng/mL (ref 0.5–5.0)
RELATIVE INDEX: INVALID (ref 0.0–2.5)
Relative Index: INVALID (ref 0.0–2.5)
Total CK: 74 U/L (ref 38–234)

## 2014-06-28 LAB — TROPONIN I
TROPONIN I: 0.07 ng/mL — AB (ref <0.031)
TROPONIN I: 0.06 ng/mL — AB (ref <0.031)
Troponin I: 0.05 ng/mL — ABNORMAL HIGH (ref <0.031)

## 2014-06-28 MED ORDER — DOCUSATE SODIUM 100 MG PO CAPS: 100.0000 mg | ORAL_CAPSULE | Freq: Two times a day (BID) | ORAL | Status: DC | PRN

## 2014-06-28 MED ORDER — ACETAMINOPHEN 650 MG RE SUPP: 650.0000 mg | Freq: Four times a day (QID) | RECTAL | Status: DC | PRN

## 2014-06-28 MED ORDER — ALBUTEROL SULFATE (2.5 MG/3ML) 0.083% IN NEBU: 2.5000 mg | INHALATION_SOLUTION | RESPIRATORY_TRACT | Status: DC | PRN

## 2014-06-28 MED ORDER — HYDRALAZINE HCL 20 MG/ML IJ SOLN: 10.0000 mg | Freq: Four times a day (QID) | INTRAMUSCULAR | Status: DC | PRN

## 2014-06-28 MED ORDER — SODIUM CHLORIDE 1 G PO TABS
1.0000 g | ORAL_TABLET | Freq: Three times a day (TID) | ORAL | Status: DC
  Administered 2014-06-28 – 2014-06-29 (×2): 1 g via ORAL
  Filled 2014-06-28 (×4): qty 1

## 2014-06-28 MED ORDER — LEVOTHYROXINE SODIUM 75 MCG PO TABS
75.0000 ug | ORAL_TABLET | Freq: Every day | ORAL | Status: DC
  Administered 2014-06-29: 75 ug via ORAL
  Filled 2014-06-28: qty 1

## 2014-06-28 MED ORDER — CALCITRIOL 0.25 MCG PO CAPS
0.2500 ug | ORAL_CAPSULE | ORAL | Status: DC
  Administered 2014-06-29: 0.25 ug via ORAL
  Filled 2014-06-28 (×2): qty 1

## 2014-06-28 MED ORDER — SODIUM BICARBONATE 650 MG PO TABS
650.0000 mg | ORAL_TABLET | Freq: Two times a day (BID) | ORAL | Status: DC
  Administered 2014-06-28 – 2014-06-29 (×2): 650 mg via ORAL
  Filled 2014-06-28 (×3): qty 1

## 2014-06-28 MED ORDER — AMLODIPINE BESYLATE 5 MG PO TABS
5.0000 mg | ORAL_TABLET | Freq: Every day | ORAL | Status: DC
Start: 2014-06-28 — End: 2014-06-29
  Administered 2014-06-28 – 2014-06-29 (×2): 5 mg via ORAL
  Filled 2014-06-28 (×2): qty 1

## 2014-06-28 MED ORDER — ASPIRIN 81 MG PO CHEW: 324.0000 mg | CHEWABLE_TABLET | Freq: Once | ORAL | Status: DC

## 2014-06-28 MED ORDER — METOPROLOL TARTRATE 50 MG PO TABS
50.0000 mg | ORAL_TABLET | ORAL | Status: DC
  Administered 2014-06-29: 50 mg via ORAL
  Filled 2014-06-28: qty 1

## 2014-06-28 MED ORDER — DABIGATRAN ETEXILATE MESYLATE 75 MG PO CAPS
75.0000 mg | ORAL_CAPSULE | Freq: Two times a day (BID) | ORAL | Status: DC
  Administered 2014-06-28 – 2014-06-29 (×2): 75 mg via ORAL
  Filled 2014-06-28 (×3): qty 1

## 2014-06-28 MED ORDER — SENNA 8.6 MG PO TABS: 1.0000 | ORAL_TABLET | Freq: Every day | ORAL | Status: DC | PRN

## 2014-06-28 MED ORDER — ASPIRIN 81 MG PO CHEW
CHEWABLE_TABLET | ORAL | Status: AC
Start: 2014-06-28 — End: 2014-06-28
  Filled 2014-06-28: qty 4

## 2014-06-28 MED ORDER — SODIUM CHLORIDE 0.9 % IV SOLN
INTRAVENOUS | Status: DC
  Administered 2014-06-28 – 2014-06-29 (×2): via INTRAVENOUS

## 2014-06-28 MED ORDER — ACETAMINOPHEN 325 MG PO TABS: 650.0000 mg | ORAL_TABLET | Freq: Four times a day (QID) | ORAL | Status: DC | PRN

## 2014-06-28 NOTE — ED Notes (Signed)
Lab informed this RN that pt's troponin level is 0.07. EDP notified.

## 2014-06-28 NOTE — Progress Notes (Signed)
VSS. Pt is not reporting any dizziness or pain. Family at the bedside. NSR. Room air. Up with standby assistance. IVF going. Up to BR and tolerated it well. Pt has no further concerns at this time.

## 2014-06-28 NOTE — ED Notes (Signed)
Pt. Arrived to ed from home with reports that she woke up this am with reprots of weakness and dizziness, and left arm pressure. Pt reports "i felt strange" PT denies pain at this time. Alert and oriented. No slurring words or facial droop noted at this time.

## 2014-06-28 NOTE — H&P (Addendum)
[facility] NAME: Kerry Walsh    MR#:  [id number]  DATE OF BIRTH:  11/23/1925  DATE OF ADMISSION:  06/28/2014  PRIMARY CARE PHYSICIAN: Kirk Ruths., MD   REQUESTING/REFERRING PHYSICIAN: Dr. Joni Fears  CHIEF COMPLAINT:   Chief Complaint  Patient presents with  . Dizziness  . Weakness    HISTORY OF PRESENT ILLNESS:  Kerry Walsh  is a 81 y.o. female with a known history of hypertension, atrial fibrillation, history of carcinoid tumor, history of stroke with no residual neurological deficits presents to the hospital from home secondary to episode of dizziness and weakness that started since last night. Patient lives at home with her husband ambulates with the help of a cane and is usually independent in most of her daily activities. Since last night she's been nauseous very weak and went to bed. This morning she woke up she felt very dizzy and weak and had palpitations. She denies any chest pain dyspnea cough fever or congestion recently. She presented to the emergency room. No prior cardiac history other than A. fib and does not have a cardiologist here. She is noted to have acute on chronic renal failure. And also mild troponin elevation. She is being admitted under observation to telemetry.  PAST MEDICAL HISTORY:   Past Medical History  Diagnosis Date  . Hypertension   . Dysrhythmia     Atrial Fibrillation  . Cancer     Carcinoid Tumor  . Stroke     No residual neurological deficits    PAST SURGICAL HISTORY:   Past Surgical History  Procedure Laterality Date  . Abdominal hysterectomy    . Appendectomy    . Cholecystectomy    . Eye surgery    . Joint replacement      Knee replacement surgery- bilateral    SOCIAL HISTORY:   History  Substance Use Topics  . Smoking status: Never Smoker   . Smokeless tobacco: Not on file  . Alcohol Use: No    FAMILY HISTORY:   Family History  Problem  Relation Age of Onset  . CAD Father     DRUG ALLERGIES:   Allergies  Allergen Reactions  . Penicillins Rash    REVIEW OF SYSTEMS:   Review of Systems  Constitutional: Positive for malaise/fatigue. Negative for fever, chills and weight loss.  HENT: Negative for hearing loss and tinnitus.   Eyes: Negative for blurred vision, double vision and photophobia.  Respiratory: Negative for cough, hemoptysis, shortness of breath and wheezing.   Cardiovascular: Positive for palpitations. Negative for chest pain, orthopnea and leg swelling.  Gastrointestinal: Positive for nausea. Negative for heartburn, vomiting, abdominal pain, diarrhea and constipation.  Genitourinary: Negative for dysuria, urgency and frequency.  Musculoskeletal: Negative for myalgias, back pain and neck pain.  Skin: Negative for rash.  Neurological: Positive for dizziness. Negative for tingling, tremors, sensory change, speech change and headaches.  Endo/Heme/Allergies: Does not bruise/bleed easily.  Psychiatric/Behavioral: Negative for depression.    MEDICATIONS AT HOME:   Prior to Admission medications   Not on File      VITAL SIGNS:  Blood pressure 169/78, pulse 66, temperature 98.1 F (36.7 C), temperature source Oral, resp. rate 18, height 5\' 2"  (1.575 m), weight 66.679 kg (147 lb), SpO2 93 %.  PHYSICAL EXAMINATION:   Physical Exam  GENERAL:  81 y.o.-year-old patient lying in the bed with no acute distress.  EYES: Pupils equal, round, reactive to light and accommodation. No  scleral icterus. Extraocular muscles intact.  HEENT: Head atraumatic, normocephalic. Oropharynx and nasopharynx clear.  NECK:  Supple, no jugular venous distention. No thyroid enlargement, no tenderness.  LUNGS: Normal breath sounds bilaterally, no wheezing, rales,rhonchi or crepitation. No use of accessory muscles of respiration.  CARDIOVASCULAR: S1, S2 normal. No murmurs, rubs, or gallops.  ABDOMEN: Soft, nontender, nondistended.  Bowel sounds present. No organomegaly or mass.  EXTREMITIES: No pedal edema, cyanosis, or clubbing.  NEUROLOGIC: Cranial nerves II through XII are intact. Muscle strength 5/5 in all extremities. Sensation intact. Gait not checked.  PSYCHIATRIC: The patient is alert and oriented x 3.  SKIN: No obvious rash, lesion, or ulcer.   LABORATORY PANEL:   CBC  Recent Labs Lab 06/28/14 0823  WBC 5.6  HGB 10.0*  HCT 30.7*  PLT 172   ------------------------------------------------------------------------------------------------------------------  Chemistries   Recent Labs Lab 06/28/14 0823  NA 139  K 4.3  CL 105  CO2 26  GLUCOSE 102*  BUN 53*  CREATININE 2.91*  CALCIUM 8.7*   ------------------------------------------------------------------------------------------------------------------  Cardiac Enzymes  Recent Labs Lab 06/28/14 1400  TROPONINI 0.07*   ------------------------------------------------------------------------------------------------------------------  RADIOLOGY:  Dg Chest 1 View  06/28/2014   CLINICAL DATA:  Dizziness, weakness  EXAM: CHEST  1 VIEW  COMPARISON:  01/03/2014  FINDINGS: Lungs are clear. Heart size and mediastinal contours are within normal limits. Atheromatous aorta. No effusion. Left humeral head prosthesis stable.  IMPRESSION: No acute cardiopulmonary disease.   Electronically Signed   By: Lucrezia Europe M.D.   On: 06/28/2014 09:02   Ct Head Wo Contrast  06/28/2014   CLINICAL DATA:  Dizziness. Weakness. History of benign brain tumor.  EXAM: CT HEAD WITHOUT CONTRAST  TECHNIQUE: Contiguous axial images were obtained from the base of the skull through the vertex without intravenous contrast.  COMPARISON:  None.  FINDINGS: There is a large calcified meningioma in the LEFT posterior fossa with mass effect on the adjacent LEFT cerebellar hemisphere. No cerebellar edema. There is no midline shift or mass effect on the fourth ventricle or cisterna magna.   Atrophy and chronic ischemic white matter disease is present. Areas of low attenuation in the water sit distribution of the LEFT frontal lobe. This is most compatible with encephalomalacia with low-attenuation extending up to the cortex and no swelling or mass effect. No hemorrhage or acute intracranial abnormality identified.  Reviewing [facility] records from [facility], there is a history of stroke.  IMPRESSION: 1. No acute intracranial abnormality. 2. LEFT frontal encephalomalacia compatible with old infarct. 3. Atrophy and chronic ischemic white matter disease. 4. 32 mm LEFT posterior fossa meningioma.   Electronically Signed   By: Dereck Ligas M.D.   On: 06/28/2014 09:28    EKG:   Orders placed or performed during the hospital encounter of 06/28/14  . ED EKG (<15mins upon arrival to the ED)  . ED EKG (<70mins upon arrival to the ED)    IMPRESSION AND PLAN:   Kerry Walsh  is a 81 y.o. female with a known history of hypertension, atrial fibrillation, CKD stage 4,  history of carcinoid tumor, history of stroke with no residual neurological deficits presents to the hospital from home secondary to episode of dizziness and weakness that started since last night.  #1 Dizziness- could be from dehydration with acute renal failure. Cannot rule out underlying arrhythmia. Known history of A. fib. Also felt palpitations. We'll monitor on telemetry for 24 hours. Recycling cardiac enzymes. Cardiology consult. Echo Doppler and carotid Dopplers have been  ordered. Physical therapy consult as well.  #2 Acute renal failure on CKD stage IV-last labs from connoted to clinic from December 2015 showing creatinine of 2.4. Known history of CKD. Creatinine now elevated at 2.9 and also elevated BUN. IV fluids and avoid nephrotoxins. Monitor if worsening will get nephrology consult and renal ultrasound at that time.  #3 Atrial fibrillation-medications need to be verified and we'll restart them. Patient on Pradaxa  for Anticoagulation. Need to Be Renally adjusted and if not the right medicine for her creatinine will need to change to eliquis.  #4 Hypertension-continue home medications. IV hydralazine when necessary.  #5 Elevated troponin-could be from poor renal clearance. Recycle troponins. Patient denies any chest pain.  #6 H/o SIADH- cont salt tabs, hold lasix/torsemide for now. Gentle hydration  #7 DVT prophylaxis-patient on Pradaxa. Continue that.  All the record as are reviewed and case discussed with ED provider. Management plans discussed with the patient, family and they are in agreement.  CODE STATUS: Full code  TOTAL TIME TAKING CARE OF THIS PATIENT: 50  minutes.    Gladstone Lighter M.D on 06/28/2014 at 5:04 PM  Between 7am to 6pm - Pager - [phone]  After 6pm go to [email or web address] - password [email or web address] [facility]  Office  [phone]  CC: Primary care physician; Kirk Ruths., MD

## 2014-06-28 NOTE — ED Provider Notes (Signed)
[facility] Emergency Department Provider Note  ____________________________________________  Time seen: Approximately 835AM  I have reviewed the triage vital signs and the nursing notes.   HISTORY  Chief Complaint Dizziness and Weakness    HPI [name] is a 81 y.o. female with a history of atrial fibrillation and presents today with weakness and left arm pressure which is now resolved. The patient says that she awoke at 4 AM and had left arm pressure for about a half an hour. She also said that while walking at this time she felt "off balance." The patient has a relative who is a nurse who examined her and did not note any slurring of her speech or facial droop. The patient denied any chest pain shortness of breath nausea vomiting or diarrhea. The left arm pain was mild to moderate and cramping.   Past Medical History  Diagnosis Date  . Hypertension     There are no active problems to display for this patient.   No past surgical history on file.  No current outpatient prescriptions on file.  Allergies Penicillins  No family history on file.  Social History History  Substance Use Topics  . Smoking status: Never Smoker   . Smokeless tobacco: Not on file  . Alcohol Use: No    Review of Systems Constitutional: No fever/chills. Weakness. Eyes: No visual changes. ENT: No sore throat. Cardiovascular: Denies chest pain. Respiratory: Denies shortness of breath. Gastrointestinal: No abdominal pain.  No nausea, no vomiting.  No diarrhea.  No constipation. Genitourinary: Negative for dysuria. Musculoskeletal: Negative for back pain. Skin: Negative for rash. Neurological: Negative for headaches, focal weakness or numbness.  10-point ROS otherwise negative.  ____________________________________________   PHYSICAL EXAM:  VITAL SIGNS: ED Triage Vitals  Enc Vitals Group     BP 06/28/14 0819 161/81 mmHg     Pulse Rate 06/28/14 0819 70      Resp 06/28/14 0819 18     Temp 06/28/14 0819 98.1 F (36.7 C)     Temp Source 06/28/14 0819 Oral     SpO2 06/28/14 0819 97 %     Weight 06/28/14 0819 147 lb (66.679 kg)     Height 06/28/14 0819 5\' 2"  (1.575 m)     Head Cir --      Peak Flow --      Pain Score --      Pain Loc --      Pain Edu? --      Excl. in [place]? --     Constitutional: Alert and oriented. Well appearing and in no acute distress. Eyes: Conjunctivae are normal. PERRL. EOMI. Head: Atraumatic. Nose: No congestion/rhinnorhea. Mouth/Throat: Mucous membranes are moist.  Oropharynx non-erythematous. Neck: No stridor.   Cardiovascular: Normal rate, irregular rhythm. Grossly normal heart sounds.  Good peripheral circulation. Respiratory: Normal respiratory effort.  No retractions. Lungs CTAB. Gastrointestinal: Soft and nontender. No distention. No abdominal bruits. No CVA tenderness. Musculoskeletal: No lower extremity tenderness nor edema.  No joint effusions. Neurologic:  Normal speech and language. No gross focal neurologic deficits are appreciated. Speech is normal. No gait instability. Ambulated at the bedside and has a normal gait. Patient does not feel dizzy or off balance at this time. Skin:  Skin is warm, dry and intact. No rash noted. Psychiatric: Mood and affect are normal. Speech and behavior are normal.  Labs:    Patient with troponin of 0.7. Also has renal failure. However, we have no old records here and  the patient is from [place]. Has not had a cardiac workup in [place] over the past year.   Ekg:   ED ECG REPORT   Date: 06/28/2014  EKG Time: 820  Rate: 71  Rhythm: normal EKG, normal sinus rhythm, unchanged from previous tracings, atrial fibrillation, rate 71  Axis: Left axis  Intervals:left bundle branch block  ST&T Change: There are no ST elevations or depressions. There were no previous EKGs to compare to.  ____________________________________________  RADIOLOGY  No acute  intracranial abnormality. Chest x-ray NAD ____________________________________________   PROCEDURES    ____________________________________________   INITIAL IMPRESSION / ASSESSMENT AND PLAN / ED COURSE  Pertinent labs & imaging results that were available during my care of the patient were reviewed by me and considered in my medical decision making (see chart for details).  ----------------------------------------- 9:05 AM on 06/28/2014 -----------------------------------------  Discussed with Dr. Algernon Huxley, who is the cardiologist on call. I discussed the EKG with him in the left arm pain. He says given the symptoms and that this is possibly old blood bundle-branch block. It cannot be said if this is acute and a STEMI. Furthermore, without ingestion or shortness of breath it is more likely that this is not a STEMI.  ----------------------------------------- 4:20 PM on 06/28/2014 -----------------------------------------  Lab called numerous times throughout the day. Multiple issues with operations of machinery per the lab staff. Troponin eventually resulted at 0.07. Patient reassessed and continues to be chest pain-free. Resting comfortably. We'll admit for chest pain rule out. ____________________________________________   FINAL CLINICAL IMPRESSION(S) / ED DIAGNOSES  Acute left arm pain. Possible anginal problem. Initial visit.    Doran Stabler, MD 06/28/14 [phone]

## 2014-06-28 NOTE — ED Notes (Signed)
Pt from home with family. Reports weakness this morning "too worried to walk without help". Pt witnessed ambulating, with regular gait. Unremarkable neuro assessment.

## 2014-06-29 ENCOUNTER — Observation Stay: Payer: Medicare Other

## 2014-06-29 DIAGNOSIS — R42 Dizziness and giddiness: Secondary | ICD-10-CM | POA: Diagnosis not present

## 2014-06-29 LAB — CK TOTAL AND CKMB (NOT AT ARMC)
CK TOTAL: 71 U/L (ref 38–234)
CK, MB: 0.5 ng/mL (ref 0.5–5.0)
Relative Index: INVALID (ref 0.0–2.5)

## 2014-06-29 LAB — CBC
HEMATOCRIT: 26.5 % — AB (ref 35.0–47.0)
Hemoglobin: 8.6 g/dL — ABNORMAL LOW (ref 12.0–16.0)
MCH: 28.8 pg (ref 26.0–34.0)
MCHC: 32.6 g/dL (ref 32.0–36.0)
MCV: 88.4 fL (ref 80.0–100.0)
Platelets: 147 10*3/uL — ABNORMAL LOW (ref 150–440)
RBC: 3 MIL/uL — ABNORMAL LOW (ref 3.80–5.20)
RDW: 15.1 % — AB (ref 11.5–14.5)
WBC: 4.1 10*3/uL (ref 3.6–11.0)

## 2014-06-29 LAB — BASIC METABOLIC PANEL
ANION GAP: 6 (ref 5–15)
BUN: 50 mg/dL — ABNORMAL HIGH (ref 6–20)
CHLORIDE: 107 mmol/L (ref 101–111)
CO2: 25 mmol/L (ref 22–32)
CREATININE: 2.78 mg/dL — AB (ref 0.44–1.00)
Calcium: 8.1 mg/dL — ABNORMAL LOW (ref 8.9–10.3)
GFR calc Af Amer: 17 mL/min — ABNORMAL LOW (ref >60)
GFR calc non Af Amer: 14 mL/min — ABNORMAL LOW (ref >60)
Glucose, Bld: 88 mg/dL (ref 65–99)
POTASSIUM: 4.3 mmol/L (ref 3.5–5.1)
Sodium: 138 mmol/L (ref 135–145)

## 2014-06-29 LAB — TROPONIN I: TROPONIN I: 0.06 ng/mL — AB (ref <0.031)

## 2014-06-29 MED ORDER — METOPROLOL TARTRATE 25 MG PO TABS: 25.0000 mg | ORAL_TABLET | Freq: Every morning | ORAL | Status: DC

## 2014-06-29 MED ORDER — TORSEMIDE 10 MG PO TABS: 10.0000 mg | ORAL_TABLET | ORAL | Status: DC

## 2014-06-29 NOTE — Care Management Note (Signed)
Case Management Note  Patient Details  Name: Kerry Walsh MRN: [id number] Date of Birth: [birth year]  Subjective/Objective:                    Action/Plan:   Expected Discharge Date:                  Expected Discharge Plan:     In-House Referral:     Discharge planning Services     Post Acute Care Choice:    Choice offered to:     DME Arranged:    DME Agency:     HH Arranged:    [place] Agency:     Status of Service:     Medicare Important Message Given:   YES Date Medicare IM Given:   06/29/14 Medicare IM give by:   Ediberto Sens, CM Date Additional Medicare IM Given:    Additional Medicare Important Message give by:     If discussed at [place] of Stay Meetings, dates discussed:    Additional Comments:  Fredrica Capano A, RN 06/29/2014, 1:07 PM

## 2014-06-29 NOTE — Evaluation (Signed)
Physical Therapy Evaluation Patient Details Name: Kerry Walsh MRN: [id number] DOB: 1926/02/05 Today's Date: 06/29/2014   History of Present Illness  Pt is a pleasant 81 year old female who was admitted for complaints of dizziness/weakness. Pt with history of HTN, Afib, carconoid tumor, and CVA. Pt lives at home and previously indep with all mobility.  Clinical Impression   Pt is a pleasant 81 year old female who was admitted for dizziness.  Pt demonstrates all bed mobility/transfers/ambulation at baseline level. Pt does not require any further PT needs at this time. Pt will be dc in house and does not require follow up. RN aware. Will dc current orders.      Follow Up Recommendations No PT follow up    Equipment Recommendations       Recommendations for Other Services       Precautions / Restrictions Precautions Precautions: Fall Restrictions Weight Bearing Restrictions: No      Mobility  Bed Mobility Overal bed mobility: Independent                Transfers Overall transfer level: Independent                  Ambulation/Gait Ambulation/Gait assistance: Min guard Ambulation Distance (Feet): 50 Feet         General Gait Details: ambulates with IV pole demonstrating reciprocal gait pattern. No complaints of dizziness noted. Further ambulation performed without IV pole with no LOB noted, however slightly slower gait speed. Pt fatigues with increased ambulation distance. (10 min)  Stairs            Wheelchair Mobility    Modified Rankin (Stroke Patients Only)       Balance Overall balance assessment: Modified Independent                                           Pertinent Vitals/Pain Pain Assessment: No/denies pain    Home Living Family/patient expects to be discharged to:: Private residence Living Arrangements: Spouse/significant other Available Help at Discharge: Family Type of Home: House Home Access: Stairs  to enter Entrance Stairs-Rails: Can reach both Entrance Stairs-Number of Steps: 2 Home Layout: One level Home Equipment: Walker - 2 wheels;Cane - single point      Prior Function Level of Independence: Independent               Hand Dominance        Extremity/Trunk Assessment   Upper Extremity Assessment: Overall WFL for tasks assessed           Lower Extremity Assessment: Overall WFL for tasks assessed         Communication   Communication: No difficulties  Cognition Arousal/Alertness: Awake/alert Behavior During Therapy: WFL for tasks assessed/performed Overall Cognitive Status: Within Functional Limits for tasks assessed                      General Comments      Exercises        Assessment/Plan    PT Assessment Patent does not need any further PT services  PT Diagnosis Difficulty walking   PT Problem List    PT Treatment Interventions     PT Goals (Current goals can be found in the Care Plan section) Acute Rehab PT Goals Patient Stated Goal: to return home PT Goal Formulation: With patient Time For  Goal Achievement: 06/29/14 Potential to Achieve Goals: Good    Frequency     Barriers to discharge        Co-evaluation               End of Session Equipment Utilized During Treatment: Gait belt Activity Tolerance: Patient tolerated treatment well Patient left: in chair Nurse Communication: Mobility status    Functional Assessment Tool Used: AMPAC without stairs Functional Limitation: Mobility: Walking and moving around Mobility: Walking and Moving Around Current Status [phone]): 0 percent impaired, limited or restricted Mobility: Walking and Moving Around Goal Status [phone]): 0 percent impaired, limited or restricted Mobility: Walking and Moving Around Discharge Status [phone]): 0 percent impaired, limited or restricted    Time: 0922-0937 PT Time Calculation (min) (ACUTE ONLY): 15 min   Charges:   PT  Evaluation $Initial PT Evaluation Tier I: 1 Procedure PT Treatments $Gait Training: 8-22 mins   PT G Codes:   PT G-Codes **NOT FOR INPATIENT CLASS** Functional Assessment Tool Used: AMPAC without stairs Functional Limitation: Mobility: Walking and moving around Mobility: Walking and Moving Around Current Status (O0321): 0 percent impaired, limited or restricted Mobility: Walking and Moving Around Goal Status (Y2482): 0 percent impaired, limited or restricted Mobility: Walking and Moving Around Discharge Status [phone]): 0 percent impaired, limited or restricted  Greggory Stallion, PT, DPT [phone]   Salwa Bai 06/29/2014, 10:36 AM

## 2014-06-29 NOTE — Discharge Instructions (Signed)
2 gram sodium diet Cont use of your walker or cane Activity as tolerated

## 2014-06-29 NOTE — Progress Notes (Signed)
Patient is alert and oriented this shift. No c/o dizziness or any acute pain reported. No s/s of acute respiratory distress noted. Resting quietly at this time with her eyes closed, respirations even and unlabored.

## 2014-06-29 NOTE — Progress Notes (Signed)
[facility] [place] NAME: Kerry Walsh    MR#:  [id number]  DATE OF BIRTH:  1926/01/21  DATE OF ADMISSION:  06/28/2014 ADMITTING PHYSICIAN: Gladstone Lighter, MD  DATE OF DISCHARGE: 06/29/2014  PRIMARY CARE PHYSICIAN: Kirk Ruths., MD    ADMISSION DIAGNOSIS:  Chronic renal failure, unspecified stage [N18.9] Pain of left upper extremity [M79.602]  DISCHARGE DIAGNOSIS:  Dizziness due to acute on chronic renal failure  SECONDARY DIAGNOSIS:   Past Medical History  Diagnosis Date  . Hypertension   . Dysrhythmia     Atrial Fibrillation  . Cancer     Carcinoid Tumor  . Stroke     No residual neurological deficits  . Chronic kidney disease     CKD with last known creatinine fo 2.4 in Dec 2015    HOSPITAL COURSE:    Kerry Walsh is a 81 y.o. female with a known history of hypertension, atrial fibrillation, CKD stage 4, history of carcinoid tumor, history of stroke with no residual neurological deficits presents to the hospital from home secondary to episode of dizziness and weakness that started since last night.  #1 Dizziness- likely from dehydration with acute renal failure. -basleine creat 2.6-2.7 -came in with creat of 2.92--->IVF--->2.7 (baseline) -d/c lasix. Changed to Torsemide qod. Pt clinically not volume overload and was getting excessive diuretics(lasix 20 mg and torsemide 10 mg)  #2 Chronic Atrial fibrillation with slow vent rate  -HR in the 50-60. Decreased Lopressor to 25 mg daily -cont Pradaxa as before. If crcl cont to decline consider warfarin (pt is at borderline with it)  #3 Hypertension-continue home medications -BB,lisinopril and amlodipin  #4 Elevated troponin-could be from poor renal clearance.patient denies any chest pain.  #5 H/o SIADH- cont salt tabs  #6 DVT prophylaxis-patient on Pradaxa.   #7 PT evaluation spoke with PT and she does not need any PT at home DISCHARGE  CONDITIONS:   fair  CONSULTS OBTAINED:   none  DRUG ALLERGIES:   Allergies  Allergen Reactions  . Penicillins Rash    DISCHARGE MEDICATIONS:   Current Discharge Medication List    CONTINUE these medications which have CHANGED   Details  metoprolol tartrate (LOPRESSOR) 25 MG tablet Take 1 tablet (25 mg total) by mouth every morning. Qty: 30 tablet, Refills: 0    torsemide (DEMADEX) 10 MG tablet Take 1 tablet (10 mg total) by mouth every other day. Qty: 30 tablet, Refills: 0      CONTINUE these medications which have NOT CHANGED   Details  amLODipine (NORVASC) 5 MG tablet Take 5 mg by mouth daily.    calcitRIOL (ROCALTROL) 0.25 MCG capsule Take 0.25 mcg by mouth every morning.    dabigatran (PRADAXA) 75 MG CAPS capsule Take 75 mg by mouth 2 (two) times daily.    enalapril (VASOTEC) 5 MG tablet Take 5 mg by mouth every morning.    levothyroxine (SYNTHROID, LEVOTHROID) 75 MCG tablet Take 75 mcg by mouth daily before breakfast.    sodium bicarbonate 650 MG tablet Take 650 mg by mouth 2 (two) times daily.    sodium chloride 1 G tablet Take 1 g by mouth 3 (three) times daily.      STOP taking these medications     ciprofloxacin (CIPRO) 500 MG tablet      furosemide (LASIX) 20 MG tablet          DISCHARGE INSTRUCTIONS:   Renal, 2 gram sodium diet  If you experience worsening  of your admission symptoms, develop shortness of breath, life threatening emergency, suicidal or homicidal thoughts you must seek medical attention immediately by calling 911 or calling your MD immediately  if symptoms less severe.  You Must read complete instructions/literature along with all the possible adverse reactions/side effects for all the Medicines you take and that have been prescribed to you. Take any new Medicines after you have completely understood and accept all the possible adverse reactions/side effects.   Please note  You were cared for by a hospitalist during your  hospital stay. If you have any questions about your discharge medications or the care you received while you were in the hospital after you are discharged, you can call the unit and asked to speak with the hospitalist on call if the hospitalist that took care of you is not available. Once you are discharged, your primary care physician will handle any further medical issues. Please note that NO REFILLS for any discharge medications will be authorized once you are discharged, as it is imperative that you return to your primary care physician (or establish a relationship with a primary care physician if you do not have one) for your aftercare needs so that they can reassess your need for medications and monitor your lab values.    Today   SUBJECTIVE   Overall feels much improved. Not dizzy anymore.   VITAL SIGNS:  Blood pressure 152/65, pulse 76, temperature 98.1 F (36.7 C), temperature source Oral, resp. rate 18, height 5\' 2"  (1.575 m), weight 64.275 kg (141 lb 11.2 oz), SpO2 95 %.  I/O:   Intake/Output Summary (Last 24 hours) at 06/29/14 1046 Last data filed at 06/29/14 0518  Gross per 24 hour  Intake      0 ml  Output    700 ml  Net   -700 ml    PHYSICAL EXAMINATION:  GENERAL:  81 y.o.-year-old patient lying in the bed with no acute distress.  EYES: Pupils equal, round, reactive to light and accommodation. No scleral icterus. Extraocular muscles intact.  HEENT: Head atraumatic, normocephalic. Oropharynx and nasopharynx clear.  NECK:  Supple, no jugular venous distention. No thyroid enlargement, no tenderness.  LUNGS: Normal breath sounds bilaterally, no wheezing, rales,rhonchi or crepitation. No use of accessory muscles of respiration.  CARDIOVASCULAR: S1, S2 normal. No murmurs, rubs, or gallops.  ABDOMEN: Soft, non-tender, non-distended. Bowel sounds present. No organomegaly or mass.  EXTREMITIES: No pedal edema, cyanosis, or clubbing.  NEUROLOGIC: Cranial nerves II through XII  are intact. Muscle strength 5/5 in all extremities. Sensation intact. Gait not checked.  PSYCHIATRIC: The patient is alert and oriented x 3.  SKIN: No obvious rash, lesion, or ulcer.   DATA REVIEW:   CBC  Recent Labs Lab 06/29/14 0145  WBC 4.1  HGB 8.6*  HCT 26.5*  PLT 147*    Chemistries   Recent Labs Lab 06/29/14 0145  NA 138  K 4.3  CL 107  CO2 25  GLUCOSE 88  BUN 50*  CREATININE 2.78*  CALCIUM 8.1*     RADIOLOGY:  Dg Chest 1 View  06/28/2014   CLINICAL DATA:  Dizziness, weakness  EXAM: CHEST  1 VIEW  COMPARISON:  01/03/2014  FINDINGS: Lungs are clear. Heart size and mediastinal contours are within normal limits. Atheromatous aorta. No effusion. Left humeral head prosthesis stable.  IMPRESSION: No acute cardiopulmonary disease.   Electronically Signed   By: Lucrezia Europe M.D.   On: 06/28/2014 09:02   Ct Head Wo Contrast  06/28/2014   CLINICAL DATA:  Dizziness. Weakness. History of benign brain tumor.  EXAM: CT HEAD WITHOUT CONTRAST  TECHNIQUE: Contiguous axial images were obtained from the base of the skull through the vertex without intravenous contrast.  COMPARISON:  None.  FINDINGS: There is a large calcified meningioma in the LEFT posterior fossa with mass effect on the adjacent LEFT cerebellar hemisphere. No cerebellar edema. There is no midline shift or mass effect on the fourth ventricle or cisterna magna.  Atrophy and chronic ischemic white matter disease is present. Areas of low attenuation in the water sit distribution of the LEFT frontal lobe. This is most compatible with encephalomalacia with low-attenuation extending up to the cortex and no swelling or mass effect. No hemorrhage or acute intracranial abnormality identified.  Reviewing medical records from [facility], there is a history of stroke.  IMPRESSION: 1. No acute intracranial abnormality. 2. LEFT frontal encephalomalacia compatible with old infarct. 3. Atrophy and chronic ischemic white matter disease. 4.  32 mm LEFT posterior fossa meningioma.   Electronically Signed   By: Dereck Ligas M.D.   On: 06/28/2014 09:28    EKG:   Orders placed or performed during the hospital encounter of 06/28/14  . ED EKG (<63mins upon arrival to the ED)  . ED EKG (<37mins upon arrival to the ED)  . EKG 12-Lead  . EKG 12-Lead      Management plans discussed with the patient, family and they are in agreement.  CODE STATUS:     Code Status Orders        Start     Ordered   06/28/14 1748  Full code   Continuous     06/28/14 1747      TOTAL TIME TAKING CARE OF THIS PATIENT: 35 minutes.    Willey Due M.D on 06/29/2014 at 10:46 AM  Between 7am to 6pm - Pager - [phone] After 6pm go to [email or web address] - password [email or web address] [facility]  Office  [phone]  CC: Primary care physician; Kirk Ruths., MD

## 2014-07-15 ENCOUNTER — Ambulatory Visit: Payer: Medicare Other

## 2014-07-15 ENCOUNTER — Inpatient Hospital Stay: Payer: Medicare Other | Attending: Oncology

## 2014-07-15 DIAGNOSIS — C772 Secondary and unspecified malignant neoplasm of intra-abdominal lymph nodes: Secondary | ICD-10-CM | POA: Diagnosis not present

## 2014-07-15 DIAGNOSIS — C569 Malignant neoplasm of unspecified ovary: Secondary | ICD-10-CM | POA: Diagnosis present

## 2014-07-15 DIAGNOSIS — Z79899 Other long term (current) drug therapy: Secondary | ICD-10-CM | POA: Diagnosis not present

## 2014-07-15 DIAGNOSIS — C7A1 Malignant poorly differentiated neuroendocrine tumors: Secondary | ICD-10-CM

## 2014-07-15 MED ORDER — OCTREOTIDE ACETATE 20 MG IM KIT
20.0000 mg | PACK | Freq: Once | INTRAMUSCULAR | Status: AC
  Administered 2014-07-15: 20 mg via INTRAMUSCULAR
  Filled 2014-07-15: qty 1

## 2014-08-12 ENCOUNTER — Other Ambulatory Visit: Payer: Self-pay | Admitting: *Deleted

## 2014-08-12 ENCOUNTER — Inpatient Hospital Stay: Payer: Medicare Other

## 2014-08-12 ENCOUNTER — Encounter: Payer: Self-pay | Admitting: Oncology

## 2014-08-12 ENCOUNTER — Inpatient Hospital Stay: Payer: Medicare Other | Attending: Oncology | Admitting: Oncology

## 2014-08-12 VITALS — BP 125/64 | HR 59 | Temp 97.1°F | Wt 138.9 lb

## 2014-08-12 DIAGNOSIS — Z79899 Other long term (current) drug therapy: Secondary | ICD-10-CM | POA: Diagnosis not present

## 2014-08-12 DIAGNOSIS — Z8673 Personal history of transient ischemic attack (TIA), and cerebral infarction without residual deficits: Secondary | ICD-10-CM | POA: Insufficient documentation

## 2014-08-12 DIAGNOSIS — C7A8 Other malignant neuroendocrine tumors: Secondary | ICD-10-CM

## 2014-08-12 DIAGNOSIS — C7A098 Malignant carcinoid tumors of other sites: Secondary | ICD-10-CM | POA: Diagnosis not present

## 2014-08-12 DIAGNOSIS — C7A1 Malignant poorly differentiated neuroendocrine tumors: Secondary | ICD-10-CM

## 2014-08-12 DIAGNOSIS — Z90710 Acquired absence of both cervix and uterus: Secondary | ICD-10-CM | POA: Diagnosis not present

## 2014-08-12 DIAGNOSIS — N19 Unspecified kidney failure: Secondary | ICD-10-CM | POA: Diagnosis not present

## 2014-08-12 DIAGNOSIS — Z9049 Acquired absence of other specified parts of digestive tract: Secondary | ICD-10-CM | POA: Insufficient documentation

## 2014-08-12 DIAGNOSIS — D649 Anemia, unspecified: Secondary | ICD-10-CM | POA: Diagnosis not present

## 2014-08-12 DIAGNOSIS — I10 Essential (primary) hypertension: Secondary | ICD-10-CM | POA: Diagnosis not present

## 2014-08-12 DIAGNOSIS — C786 Secondary malignant neoplasm of retroperitoneum and peritoneum: Secondary | ICD-10-CM | POA: Diagnosis not present

## 2014-08-12 DIAGNOSIS — I4891 Unspecified atrial fibrillation: Secondary | ICD-10-CM | POA: Insufficient documentation

## 2014-08-12 DIAGNOSIS — N189 Chronic kidney disease, unspecified: Secondary | ICD-10-CM | POA: Diagnosis not present

## 2014-08-12 HISTORY — DX: Malignant poorly differentiated neuroendocrine tumors: C7A.1

## 2014-08-12 LAB — CBC WITH DIFFERENTIAL/PLATELET
BASOS PCT: 2 %
Basophils Absolute: 0.1 10*3/uL (ref 0–0.1)
EOS ABS: 0.1 10*3/uL (ref 0–0.7)
EOS PCT: 3 %
HCT: 28.3 % — ABNORMAL LOW (ref 35.0–47.0)
Hemoglobin: 9.1 g/dL — ABNORMAL LOW (ref 12.0–16.0)
Lymphocytes Relative: 22 %
Lymphs Abs: 1 10*3/uL (ref 1.0–3.6)
MCH: 28.4 pg (ref 26.0–34.0)
MCHC: 32.1 g/dL (ref 32.0–36.0)
MCV: 88.3 fL (ref 80.0–100.0)
MONO ABS: 0.3 10*3/uL (ref 0.2–0.9)
Monocytes Relative: 6 %
Neutro Abs: 3.1 10*3/uL (ref 1.4–6.5)
Neutrophils Relative %: 67 %
Platelets: 164 10*3/uL (ref 150–440)
RBC: 3.2 MIL/uL — ABNORMAL LOW (ref 3.80–5.20)
RDW: 14.5 % (ref 11.5–14.5)
WBC: 4.6 10*3/uL (ref 3.6–11.0)

## 2014-08-12 LAB — COMPREHENSIVE METABOLIC PANEL
ALK PHOS: 76 U/L (ref 38–126)
ALT: 12 U/L — ABNORMAL LOW (ref 14–54)
ANION GAP: 7 (ref 5–15)
AST: 21 U/L (ref 15–41)
Albumin: 3.8 g/dL (ref 3.5–5.0)
BILIRUBIN TOTAL: 0.4 mg/dL (ref 0.3–1.2)
BUN: 48 mg/dL — AB (ref 6–20)
CO2: 23 mmol/L (ref 22–32)
CREATININE: 2.88 mg/dL — AB (ref 0.44–1.00)
Calcium: 8.2 mg/dL — ABNORMAL LOW (ref 8.9–10.3)
Chloride: 104 mmol/L (ref 101–111)
GFR calc Af Amer: 16 mL/min — ABNORMAL LOW (ref >60)
GFR calc non Af Amer: 14 mL/min — ABNORMAL LOW (ref >60)
Glucose, Bld: 118 mg/dL — ABNORMAL HIGH (ref 65–99)
POTASSIUM: 4.4 mmol/L (ref 3.5–5.1)
Sodium: 134 mmol/L — ABNORMAL LOW (ref 135–145)
TOTAL PROTEIN: 6.6 g/dL (ref 6.5–8.1)

## 2014-08-12 MED ORDER — OCTREOTIDE ACETATE 30 MG IM KIT
30.0000 mg | PACK | Freq: Once | INTRAMUSCULAR | Status: AC
  Administered 2014-08-12: 30 mg via INTRAMUSCULAR
  Filled 2014-08-12: qty 1

## 2014-08-12 NOTE — Progress Notes (Signed)
Patient does not have living will.  Never smoked. 

## 2014-08-25 ENCOUNTER — Encounter: Payer: Self-pay | Admitting: Oncology

## 2014-08-25 NOTE — Progress Notes (Signed)
[place] @ [facility] [phone]  Fax:([phone]) [place]: 11/08/1925  MR#: [id number]  QKM#:[id number]  Patient Care Team: Kirk Ruths, MD as PCP - General (Internal Medicine)  CHIEF COMPLAINT:  Chief Complaint  Patient presents with  . Follow-up    ovarian cystic mass underwent robotic bilateral salpingo-oophorectomy in April of 2011 Neuroendocrine tumor, been differentiated carcinoid 2.Ocereoride scan revealed mets  to retro peroneal lymph node Patient underwent lymph node dissection in July of 2011 3.  Patient has been started on Sandostatin LAR from September of 2012 because of symptoms of carcinoid syndrome  Oncology Flowsheet 07/15/2014 08/12/2014  octreotide (SANDOSTATIN LAR) IM 20 mg 30 mg    INTERVAL HISTORY:  81 year old lady recently moved to [place]. since that time she is on Sandostatin her diarrhea is much improved so as abdominal pain.Patient is getting Sandostatin LAR monthly.  patient's general condition is improving.  No abdominal pain.  No nausea and vomiting.  No diarrhea.  Appetidiet has improved Patient does not have any abdominal pain.  No nausea.  No vomiting.  No diarrhea. REVIEW OF SYSTEMS:   GENERAL:  Feels good.  Active.  No fevers, sweats or weight loss. PERFORMANCE STATUS (ECOG): 01 HEENT:  No visual changes, runny nose, sore throat, mouth sores or tenderness. Lungs: No shortness of breath or cough.  No hemoptysis. Cardiac:  No chest pain, palpitations, orthopnea, or PND. GI:  No nausea, vomiting, diarrhea, constipation, melena or hematochezia. GU:  No urgency, frequency, dysuria, or hematuria. Musculoskeletal:  No back pain.  No joint pain.  No muscle tenderness. Extremities:  No pain or swelling. Skin:  No rashes or skin changes. Neuro:  No headache, numbness or weakness, balance or coordination issues. Endocrine:  No diabetes, thyroid issues, hot flashes or night sweats. Psych:  No mood changes,  depression or anxiety. Pain:  No focal pain. Review of systems:  All other systems reviewed and found to be negative. As per HPI. Otherwise, a complete review of systems is negatve.  PAST MEDICAL HISTORY: Past Medical History  Diagnosis Date  . Hypertension   . Dysrhythmia     Atrial Fibrillation  . Cancer     Carcinoid Tumor  . Stroke     No residual neurological deficits  . Chronic kidney disease     CKD with last known creatinine fo 2.4 in Dec 2015  . 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 Abdominal hysterectomy    . Appendectomy    . Cholecystectomy    . Eye surgery    . Joint replacement      Knee replacement surgery- bilateral    FAMILY HISTORY Family History  Problem Relation Age of Onset  . CAD Father     ADVANCED DIRECTIVES:  Patient does not have any living will.  Information is given HEALTH MAINTENANCE: History  Substance Use Topics  . Smoking status: Never Smoker   . Smokeless tobacco: Not on file  . Alcohol Use: No      Allergies  Allergen Reactions  . Penicillins Rash    Current Outpatient Prescriptions  Medication Sig Dispense Refill  . amLODipine (NORVASC) 5 MG tablet Take 5 mg by mouth daily.    . calcitRIOL (ROCALTROL) 0.25 MCG capsule Take 0.25 mcg by mouth every morning.    . dabigatran (PRADAXA) 75 MG CAPS capsule Take 75 mg by mouth 2 (two)  times daily.    . enalapril (VASOTEC) 5 MG tablet Take 5 mg by mouth every morning.    Marland Kitchen levothyroxine (SYNTHROID, LEVOTHROID) 75 MCG tablet Take 75 mcg by mouth daily before breakfast.    . metoprolol tartrate (LOPRESSOR) 25 MG tablet Take 1 tablet (25 mg total) by mouth every morning. 30 tablet 0  . sodium bicarbonate 650 MG tablet Take 650 mg by mouth 2 (two) times daily.    . sodium chloride 1 G tablet Take 1 g by mouth 3 (three) times daily.    Marland Kitchen torsemide (DEMADEX) 10 MG tablet Take 1  tablet (10 mg total) by mouth every other day. 30 tablet 0   No current facility-administered medications for this visit.    OBJECTIVE:  Filed Vitals:   08/12/14 1408  BP: 125/64  Pulse: 59  Temp: 97.1 F (36.2 C)     Body mass index is 25.4 kg/(m^2).    ECOG FS:1 - Symptomatic but completely ambulatory  PHYSICAL EXAM: GENERAL:  Well developed, well nourished, sitting comfortably in the exam room in no acute distress. MENTAL STATUS:  Alert and oriented to person, place and time. HEAD:    Normocephalic, atraumatic, face symmetric, no Cushingoid features. EYES: .  Pupils equal round and reactive to light and accomodation.  No conjunctivitis or scleral icterus. ENT:  Oropharynx clear without lesion.  Tongue normal. Mucous membranes moist.  RESPIRATORY:  Clear to auscultation without rales, wheezes or rhonchi. CARDIOVASCULAR:  Regular rate and rhythm without murmur, rub or gallop.  ABDOMEN:  Soft, non-tender, with active bowel sounds, and no hepatosplenomegaly.  No masses. BACK:  No CVA tenderness.  No tenderness on percussion of the back or rib cage. SKIN:  No rashes, ulcers or lesions. EXTREMITIES: No edema, no skin discoloration or tenderness.  No palpable cords. LYMPH NODES: No palpable cervical, supraclavicular, axillary or inguinal adenopathy  NEUROLOGICAL: Unremarkable. PSYCH:  Appropriate.   LAB RESULTS:  Appointment on 08/12/2014  Component Date Value Ref Range Status  . WBC 08/12/2014 4.6  3.6 - 11.0 K/uL Final  . RBC 08/12/2014 3.20* 3.80 - 5.20 MIL/uL Final  . Hemoglobin 08/12/2014 9.1* 12.0 - 16.0 g/dL Final  . HCT 08/12/2014 28.3* 35.0 - 47.0 % Final  . MCV 08/12/2014 88.3  80.0 - 100.0 fL Final  . MCH 08/12/2014 28.4  26.0 - 34.0 pg Final  . MCHC 08/12/2014 32.1  32.0 - 36.0 g/dL Final  . RDW 08/12/2014 14.5  11.5 - 14.5 % Final  . Platelets 08/12/2014 164  150 - 440 K/uL Final  . Neutrophils Relative % 08/12/2014 67   Final  . Neutro Abs 08/12/2014 3.1  1.4  - 6.5 K/uL Final  . Lymphocytes Relative 08/12/2014 22   Final  . Lymphs Abs 08/12/2014 1.0  1.0 - 3.6 K/uL Final  . Monocytes Relative 08/12/2014 6   Final  . Monocytes Absolute 08/12/2014 0.3  0.2 - 0.9 K/uL Final  . Eosinophils Relative 08/12/2014 3   Final  . Eosinophils Absolute 08/12/2014 0.1  0 - 0.7 K/uL Final  . Basophils Relative 08/12/2014 2   Final  . Basophils Absolute 08/12/2014 0.1  0 - 0.1 K/uL Final  . Sodium 08/12/2014 134* 135 - 145 mmol/L Final  . Potassium 08/12/2014 4.4  3.5 - 5.1 mmol/L Final  . Chloride 08/12/2014 104  101 - 111 mmol/L Final  . CO2 08/12/2014 23  22 - 32 mmol/L Final  . Glucose, Bld 08/12/2014 118* 65 - 99 mg/dL Final  .  BUN 08/12/2014 48* 6 - 20 mg/dL Final  . Creatinine, Ser 08/12/2014 2.88* 0.44 - 1.00 mg/dL Final  . Calcium 08/12/2014 8.2* 8.9 - 10.3 mg/dL Final  . Total Protein 08/12/2014 6.6  6.5 - 8.1 g/dL Final  . Albumin 08/12/2014 3.8  3.5 - 5.0 g/dL Final  . AST 08/12/2014 21  15 - 41 U/L Final  . ALT 08/12/2014 12* 14 - 54 U/L Final  . Alkaline Phosphatase 08/12/2014 76  38 - 126 U/L Final  . Total Bilirubin 08/12/2014 0.4  0.3 - 1.2 mg/dL Final  . GFR calc non Af Amer 08/12/2014 14* >60 mL/min Final  . GFR calc Af Amer 08/12/2014 16* >60 mL/min Final   Comment: (NOTE) The eGFR has been calculated using the CKD EPI equation. This calculation has not been validated in all clinical situations. eGFR's persistently <60 mL/min signify possible Chronic Kidney Disease.   . 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 She also understands that She can call clinic at any time with any questions, concerns, or complaints.    No matching staging information was found for the patient.  Forest Gleason, MD   08/25/2014 7:34 PM    Patient does have renal failure

## 2014-08-26 ENCOUNTER — Other Ambulatory Visit: Payer: Self-pay | Admitting: *Deleted

## 2014-08-26 DIAGNOSIS — C7A1 Malignant poorly differentiated neuroendocrine tumors: Secondary | ICD-10-CM

## 2014-09-09 ENCOUNTER — Inpatient Hospital Stay: Payer: Medicare Other

## 2014-09-09 ENCOUNTER — Inpatient Hospital Stay: Payer: Medicare Other | Attending: Oncology

## 2014-09-25 ENCOUNTER — Emergency Department: Payer: Medicare Other

## 2014-09-25 ENCOUNTER — Inpatient Hospital Stay
Admission: EM | Admit: 2014-09-25 | Discharge: 2014-09-26 | DRG: 292 | Disposition: A | Discharge status: Home / Self Care | Payer: Medicare Other | Attending: Internal Medicine | Admitting: Internal Medicine

## 2014-09-25 ENCOUNTER — Encounter: Payer: Self-pay | Admitting: Emergency Medicine

## 2014-09-25 DIAGNOSIS — Z8673 Personal history of transient ischemic attack (TIA), and cerebral infarction without residual deficits: Secondary | ICD-10-CM

## 2014-09-25 DIAGNOSIS — Z880 Allergy status to penicillin: Secondary | ICD-10-CM | POA: Diagnosis not present

## 2014-09-25 DIAGNOSIS — E039 Hypothyroidism, unspecified: Secondary | ICD-10-CM | POA: Diagnosis present

## 2014-09-25 DIAGNOSIS — D638 Anemia in other chronic diseases classified elsewhere: Secondary | ICD-10-CM | POA: Diagnosis present

## 2014-09-25 DIAGNOSIS — N184 Chronic kidney disease, stage 4 (severe): Secondary | ICD-10-CM | POA: Diagnosis present

## 2014-09-25 DIAGNOSIS — Z96653 Presence of artificial knee joint, bilateral: Secondary | ICD-10-CM | POA: Diagnosis present

## 2014-09-25 DIAGNOSIS — I5043 Acute on chronic combined systolic (congestive) and diastolic (congestive) heart failure: Secondary | ICD-10-CM | POA: Diagnosis present

## 2014-09-25 DIAGNOSIS — Z79899 Other long term (current) drug therapy: Secondary | ICD-10-CM | POA: Diagnosis not present

## 2014-09-25 DIAGNOSIS — Z7902 Long term (current) use of antithrombotics/antiplatelets: Secondary | ICD-10-CM

## 2014-09-25 DIAGNOSIS — R778 Other specified abnormalities of plasma proteins: Secondary | ICD-10-CM

## 2014-09-25 DIAGNOSIS — I129 Hypertensive chronic kidney disease with stage 1 through stage 4 chronic kidney disease, or unspecified chronic kidney disease: Secondary | ICD-10-CM | POA: Diagnosis present

## 2014-09-25 DIAGNOSIS — Z8589 Personal history of malignant neoplasm of other organs and systems: Secondary | ICD-10-CM | POA: Diagnosis not present

## 2014-09-25 DIAGNOSIS — R7989 Other specified abnormal findings of blood chemistry: Secondary | ICD-10-CM | POA: Diagnosis present

## 2014-09-25 DIAGNOSIS — I482 Chronic atrial fibrillation: Secondary | ICD-10-CM | POA: Diagnosis present

## 2014-09-25 DIAGNOSIS — I10 Essential (primary) hypertension: Secondary | ICD-10-CM | POA: Diagnosis present

## 2014-09-25 DIAGNOSIS — I4891 Unspecified atrial fibrillation: Secondary | ICD-10-CM | POA: Diagnosis present

## 2014-09-25 DIAGNOSIS — I2510 Atherosclerotic heart disease of native coronary artery without angina pectoris: Secondary | ICD-10-CM | POA: Diagnosis present

## 2014-09-25 DIAGNOSIS — I5021 Acute systolic (congestive) heart failure: Secondary | ICD-10-CM

## 2014-09-25 DIAGNOSIS — I509 Heart failure, unspecified: Secondary | ICD-10-CM

## 2014-09-25 DIAGNOSIS — Z8249 Family history of ischemic heart disease and other diseases of the circulatory system: Secondary | ICD-10-CM

## 2014-09-25 DIAGNOSIS — I25119 Atherosclerotic heart disease of native coronary artery with unspecified angina pectoris: Secondary | ICD-10-CM | POA: Diagnosis present

## 2014-09-25 LAB — URINALYSIS COMPLETE WITH MICROSCOPIC (ARMC ONLY)
BACTERIA UA: NONE SEEN
Bilirubin Urine: NEGATIVE
Glucose, UA: NEGATIVE mg/dL
Ketones, ur: NEGATIVE mg/dL
Leukocytes, UA: NEGATIVE
Nitrite: NEGATIVE
PROTEIN: 30 mg/dL — AB
Specific Gravity, Urine: 1.005 (ref 1.005–1.030)
WBC UA: NONE SEEN WBC/hpf (ref 0–5)
pH: 6 (ref 5.0–8.0)

## 2014-09-25 LAB — BASIC METABOLIC PANEL
Anion gap: 10 (ref 5–15)
BUN: 46 mg/dL — ABNORMAL HIGH (ref 6–20)
CALCIUM: 8.5 mg/dL — AB (ref 8.9–10.3)
CO2: 20 mmol/L — ABNORMAL LOW (ref 22–32)
Chloride: 105 mmol/L (ref 101–111)
Creatinine, Ser: 2.5 mg/dL — ABNORMAL HIGH (ref 0.44–1.00)
GFR calc Af Amer: 19 mL/min — ABNORMAL LOW (ref >60)
GFR, EST NON AFRICAN AMERICAN: 16 mL/min — AB (ref >60)
Glucose, Bld: 86 mg/dL (ref 65–99)
POTASSIUM: 4.1 mmol/L (ref 3.5–5.1)
Sodium: 135 mmol/L (ref 135–145)

## 2014-09-25 LAB — HEPATIC FUNCTION PANEL
ALK PHOS: 75 U/L (ref 38–126)
ALT: 30 U/L (ref 14–54)
AST: 30 U/L (ref 15–41)
Albumin: 3.7 g/dL (ref 3.5–5.0)
BILIRUBIN DIRECT: 0.1 mg/dL (ref 0.1–0.5)
Indirect Bilirubin: 0.6 mg/dL (ref 0.3–0.9)
TOTAL PROTEIN: 6.5 g/dL (ref 6.5–8.1)
Total Bilirubin: 0.7 mg/dL (ref 0.3–1.2)

## 2014-09-25 LAB — CBC
HCT: 28 % — ABNORMAL LOW (ref 35.0–47.0)
HEMOGLOBIN: 9.6 g/dL — AB (ref 12.0–16.0)
MCH: 29.9 pg (ref 26.0–34.0)
MCHC: 34.2 g/dL (ref 32.0–36.0)
MCV: 87.6 fL (ref 80.0–100.0)
Platelets: 190 10*3/uL (ref 150–440)
RBC: 3.2 MIL/uL — ABNORMAL LOW (ref 3.80–5.20)
RDW: 14.6 % — ABNORMAL HIGH (ref 11.5–14.5)
WBC: 4.5 10*3/uL (ref 3.6–11.0)

## 2014-09-25 LAB — TROPONIN I: Troponin I: 0.1 ng/mL — ABNORMAL HIGH (ref <0.031)

## 2014-09-25 LAB — BRAIN NATRIURETIC PEPTIDE: B Natriuretic Peptide: 1343 pg/mL — ABNORMAL HIGH (ref 0.0–100.0)

## 2014-09-25 LAB — GLUCOSE, CAPILLARY: Glucose-Capillary: 98 mg/dL (ref 65–99)

## 2014-09-25 LAB — LACTIC ACID, PLASMA
Lactic Acid, Venous: 1.1 mmol/L (ref 0.5–2.0)
Lactic Acid, Venous: 0.9 mmol/L (ref 0.5–2.0)

## 2014-09-25 MED ORDER — ASPIRIN 81 MG PO CHEW
324.0000 mg | CHEWABLE_TABLET | Freq: Once | ORAL | Status: AC
  Administered 2014-09-25: 324 mg via ORAL
  Filled 2014-09-25: qty 4

## 2014-09-25 MED ORDER — FUROSEMIDE 10 MG/ML IJ SOLN
40.0000 mg | Freq: Once | INTRAMUSCULAR | Status: AC
  Administered 2014-09-25: 40 mg via INTRAVENOUS
  Filled 2014-09-25: qty 4

## 2014-09-25 NOTE — ED Notes (Signed)
Patient to ED with c/o weakness for several days, reports feeling much worse today. Daughter reports this has happened before and did have some abnormal labs.

## 2014-09-25 NOTE — H&P (Signed)
[place] [facility] [place] NAME: Kerry Walsh    MR#:  [id number]  DATE OF BIRTH:  1925/04/15  DATE OF ADMISSION:  09/25/2014  PRIMARY CARE PHYSICIAN: Kirk Ruths., MD   REQUESTING/REFERRING PHYSICIAN: Cinda Quest, MD  CHIEF COMPLAINT:   Chief Complaint  Patient presents with  . Fatigue    HISTORY OF PRESENT ILLNESS:  Kerry Walsh  is a 81 y.o. female who presents with several days of progressive dyspnea and chest discomfort. She states that she's been having a "heaviness" in her chest. She is unable to give much history in terms of relation to exertion, though she does state that she has been getting dyspneic with her normal activities of daily living. Her symptoms were progressing and so she asked to be brought to the ED for evaluation. Here she was found to have a troponin mildly elevated 0.1, and EKG somewhat suspicious for lateral ischemia with some T-wave abnormality, and a BNP elevated at 1300. Recent echocardiogram earlier this year showed only grade 1 diastolic dysfunction, with a normal EF. Hospitalists were called for admission and workup of acute CHF, with rule out of ACS.  PAST MEDICAL HISTORY:   Past Medical History  Diagnosis Date  . Hypertension   . Dysrhythmia     Atrial Fibrillation  . Cancer     Carcinoid Tumor  . Stroke     No residual neurological deficits  . Chronic kidney disease     CKD with last known creatinine fo 2.4 in Dec 2015  . Malignant poorly differentiated neuroendocrine carcinoma 08/12/2014    PAST SURGICAL HISTORY:   Past Surgical History  Procedure Laterality Date  . Abdominal hysterectomy    . Appendectomy    . Cholecystectomy    . Eye surgery    . Joint replacement      Knee replacement surgery- bilateral    SOCIAL HISTORY:   History  Substance Use Topics  . Smoking status: Never Smoker   . Smokeless tobacco: Not on file  . Alcohol Use: No    FAMILY HISTORY:    Family History  Problem Relation Age of Onset  . CAD Father     DRUG ALLERGIES:   Allergies  Allergen Reactions  . Penicillins Rash    MEDICATIONS AT HOME:   Prior to Admission medications   Medication Sig Start Date End Date Taking? Authorizing Provider  amLODipine (NORVASC) 5 MG tablet Take 5 mg by mouth daily.   Yes Historical Provider, MD  calcitRIOL (ROCALTROL) 0.25 MCG capsule Take 0.25 mcg by mouth daily.    Yes Historical Provider, MD  ciprofloxacin (CIPRO) 500 MG tablet Take 500 mg by mouth at bedtime.    Yes Historical Provider, MD  dabigatran (PRADAXA) 75 MG CAPS capsule Take 75 mg by mouth 2 (two) times daily.   Yes Historical Provider, MD  enalapril (VASOTEC) 5 MG tablet Take 5 mg by mouth daily.    Yes Historical Provider, MD  levothyroxine (SYNTHROID, LEVOTHROID) 75 MCG tablet Take 75 mcg by mouth daily before breakfast.   Yes Historical Provider, MD  metoprolol (LOPRESSOR) 50 MG tablet Take 50 mg by mouth daily.   Yes Historical Provider, MD  sodium bicarbonate 650 MG tablet Take 650 mg by mouth 2 (two) times daily.   Yes Historical Provider, MD  sodium chloride 1 G tablet Take 1 g by mouth 3 (three) times daily.   Yes Historical Provider, MD  tolterodine (DETROL LA) 4 MG 24  hr capsule Take 4 mg by mouth daily.   Yes Historical Provider, MD  torsemide (DEMADEX) 10 MG tablet Take 1 tablet (10 mg total) by mouth every other day. Patient taking differently: Take 10 mg by mouth daily.  06/29/14  Yes Fritzi Mandes, MD  metoprolol tartrate (LOPRESSOR) 25 MG tablet Take 1 tablet (25 mg total) by mouth every morning. Patient not taking: Reported on 09/25/2014 06/29/14   Fritzi Mandes, MD    REVIEW OF SYSTEMS:  Review of Systems  Constitutional: Negative for fever, chills, weight loss and malaise/fatigue.  HENT: Negative for ear pain, hearing loss and tinnitus.   Eyes: Negative for blurred vision, double vision, pain and redness.  Respiratory: Positive for shortness of breath.  Negative for cough, hemoptysis, sputum production and wheezing.   Cardiovascular: Positive for chest pain and leg swelling. Negative for palpitations and orthopnea.  Gastrointestinal: Negative for nausea, vomiting, abdominal pain, diarrhea and constipation.  Genitourinary: Negative for dysuria, frequency and hematuria.  Musculoskeletal: Negative for back pain, joint pain and neck pain.  Skin:       No acne, rash, or lesions  Neurological: Negative for dizziness, tremors, focal weakness and weakness.  Endo/Heme/Allergies: Negative for polydipsia. Does not bruise/bleed easily.  Psychiatric/Behavioral: Negative for depression. The patient is not nervous/anxious and does not have insomnia.      VITAL SIGNS:   Filed Vitals:   09/25/14 1604 09/25/14 2030  BP: 183/88 168/81  Pulse: 94 85  Temp: 98.8 F (37.1 C)   TempSrc: Oral   Resp: 18 18  Height: 5\' 2"  (1.575 m)   Weight: 61.236 kg (135 lb)   SpO2: 96% 90%   Wt Readings from Last 3 Encounters:  09/25/14 61.236 kg (135 lb)  08/12/14 63 kg (138 lb 14.2 oz)  06/29/14 64.275 kg (141 lb 11.2 oz)    PHYSICAL EXAMINATION:  Physical Exam  Constitutional: She is oriented to person, place, and time. She appears well-developed and well-nourished. No distress.  HENT:  Head: Normocephalic and atraumatic.  Mouth/Throat: Oropharynx is clear and moist.  Eyes: Conjunctivae and EOM are normal. Pupils are equal, round, and reactive to light. No scleral icterus.  Neck: Normal range of motion. Neck supple. No JVD present. No thyromegaly present.  Cardiovascular: Normal rate and intact distal pulses.  Exam reveals no gallop and no friction rub.   No murmur heard. Irregular rhythm  Respiratory: Effort normal. No respiratory distress. She has no wheezes. She has rales.  GI: Soft. Bowel sounds are normal. She exhibits no distension. There is no tenderness.  Musculoskeletal: Normal range of motion. She exhibits edema (1+ bilateral lower extremity).   No arthritis, no gout  Lymphadenopathy:    She has no cervical adenopathy.  Neurological: She is alert and oriented to person, place, and time. No cranial nerve deficit.  No dysarthria, no aphasia  Skin: Skin is warm and dry. No rash noted. No erythema.  Psychiatric: She has a normal mood and affect. Her behavior is normal. Judgment and thought content normal.    LABORATORY PANEL:   CBC  Recent Labs Lab 09/25/14 1617  WBC 4.5  HGB 9.6*  HCT 28.0*  PLT 190   ------------------------------------------------------------------------------------------------------------------  Chemistries   Recent Labs Lab 09/25/14 1617 09/25/14 2003  NA 135  --   K 4.1  --   CL 105  --   CO2 20*  --   GLUCOSE 86  --   BUN 46*  --   CREATININE 2.50*  --  CALCIUM 8.5*  --   AST  --  30  ALT  --  30  ALKPHOS  --  75  BILITOT  --  0.7   ------------------------------------------------------------------------------------------------------------------  Cardiac Enzymes  Recent Labs Lab 09/25/14 2003  TROPONINI 0.10*   ------------------------------------------------------------------------------------------------------------------  RADIOLOGY:  Dg Chest 2 View  09/25/2014   CLINICAL DATA:  Shortness of breath with weakness for 4 days. History of atrial fibrillation, stroke and cancer. Initial encounter.  EXAM: CHEST  2 VIEW  COMPARISON:  06/28/2014 and 01/03/2014.  FINDINGS: The heart size and mediastinal contours are stable with mild aortic atherosclerosis. There is new vascular congestion with interstitial pulmonary edema, bibasilar atelectasis and probable small bilateral pleural effusions. There is no confluent airspace opacity. Patient is status post left shoulder arthroplasty and lumbar fusion. No acute osseous findings evident.  IMPRESSION: Interstitial pulmonary edema with small pleural effusions consistent with congestive heart failure.   Electronically Signed   By: Richardean Sale M.D.   On: 09/25/2014 20:21    EKG:   Orders placed or performed during the hospital encounter of 09/25/14  . ED EKG  . ED EKG    IMPRESSION AND PLAN:  Principal Problem:   Acute CHF - patient only has a known history of very mild diastolic dysfunction. However, today she comes in with elevated BNP suspicious for acute heart failure, likely with a systolic component. She has significant pulmonary edema on chest x-ray. It is unclear what the cause of her acute heart failure would be, though an acute coronary event has not yet been ruled out. We'll monitor her for the same as below, she was given a dose of diuretic in the ED, we will continue with her home dose of torsemide after this but monitor her closely as she has significant CKD. We'll get a cardiology consult, and repeat her echocardiogram. Given her CKD, we may consider using BiPAP to help with her pulmonary edema if this becomes necessary rather than being more aggressive with her diuretic medications Active Problems:   Elevated troponin - we'll trend her enzymes tonight, and start a heparin drip if they trend up any further.   HTN (hypertension) - she is significantly hypertensive in the ED, we'll continue her home antihypertensives and included some IV when necessary antihypertensives while here to keep her blood pressure less than 160/100   Atrial fibrillation - continue home rate controlling medications, and pradaxa for anticoagulation   Arteriosclerosis of coronary artery - continue home medications for this, and rule out for ACS as above   Chronic kidney disease (CKD), stage IV (severe) - avoid nephrotoxins, judicious use of diuretics for her heart failure   Adult hypothyroidism - continue home thyroid replacement  All the records are reviewed and case discussed with ED provider. Management plans discussed with the patient and/or family.  DVT PROPHYLAXIS: Systemic anticoagulation  ADMISSION STATUS: Inpatient  CODE  STATUS: Full  TOTAL TIME TAKING CARE OF THIS PATIENT: 45 minutes.    Lizann Edelman FIELDING 09/25/2014, 10:43 PM  [facility]  Office  [phone]  CC: Primary care physician; Kirk Ruths., MD

## 2014-09-25 NOTE — ED Provider Notes (Signed)
[facility] Emergency Department Provider Note  ____________________________________________  Time seen: Approximately 9:24 PM  I have reviewed the triage vital signs and the nursing notes.   HISTORY  Chief Complaint Fatigue    HPI Kerry Walsh is a 81 y.o. female who has become increasingly short of breath in the last few days. She is also fatigued and tired and does not feel like walking. She reports that when she walks even just across the room she gets short of breath and gets a heavy sensation in the bottom of her chest the epigastric area stomach. This gets worse if she walks and improves and goes away if she rests. Patient has not had this before. The pain does not radiate anywhere. There is no real sweating or nausea with it.   Past Medical History  Diagnosis Date  . Hypertension   . Dysrhythmia     Atrial Fibrillation  . Cancer     Carcinoid Tumor  . Stroke     No residual neurological deficits  . Chronic kidney disease     CKD with last known creatinine fo 2.4 in Dec 2015  . Malignant poorly differentiated neuroendocrine carcinoma 08/12/2014    Patient Active Problem List   Diagnosis Date Noted  . 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 Dizziness 06/28/2014  . Atrial fibrillation 06/28/2014  . Dizziness and giddiness 06/28/2014  . Osteoporosis, post-menopausal 03/09/2014  . ADH disorder 01/23/2014  . 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 Arteriosclerosis of coronary artery 08/23/2013  . Chronic kidney disease (CKD), stage IV (severe) 08/23/2013  . Adult hypothyroidism 08/23/2013    Past Surgical History  Procedure Laterality Date  . Abdominal hysterectomy    . Appendectomy    . Cholecystectomy    . Eye surgery    . Joint replacement      Knee replacement surgery- bilateral    Current Outpatient Rx  Name  Route  Sig  Dispense  Refill  . amLODipine (NORVASC) 5 MG tablet   Oral   Take  5 mg by mouth daily.         . calcitRIOL (ROCALTROL) 0.25 MCG capsule   Oral   Take 0.25 mcg by mouth daily.          . ciprofloxacin (CIPRO) 500 MG tablet   Oral   Take 500 mg by mouth at bedtime.          . dabigatran (PRADAXA) 75 MG CAPS capsule   Oral   Take 75 mg by mouth 2 (two) times daily.         . enalapril (VASOTEC) 5 MG tablet   Oral   Take 5 mg by mouth daily.          Marland Kitchen levothyroxine (SYNTHROID, LEVOTHROID) 75 MCG tablet   Oral   Take 75 mcg by mouth daily before breakfast.         . metoprolol (LOPRESSOR) 50 MG tablet   Oral   Take 50 mg by mouth daily.         . sodium bicarbonate 650 MG tablet   Oral   Take 650 mg by mouth 2 (two) times daily.         . sodium chloride 1 G tablet   Oral   Take 1 g by mouth 3 (three) times daily.         Marland Kitchen tolterodine (DETROL LA) 4 MG 24 hr capsule   Oral   Take 4  mg by mouth daily.         Marland Kitchen torsemide (DEMADEX) 10 MG tablet   Oral   Take 1 tablet (10 mg total) by mouth every other day. Patient taking differently: Take 10 mg by mouth daily.    30 tablet   0   . metoprolol tartrate (LOPRESSOR) 25 MG tablet   Oral   Take 1 tablet (25 mg total) by mouth every morning. Patient not taking: Reported on 09/25/2014   30 tablet   0     Allergies Penicillins  Family History  Problem Relation Age of Onset  . CAD Father     Social History History  Substance Use Topics  . Smoking status: Never Smoker   . Smokeless tobacco: Not on file  . Alcohol Use: No    Review of Systems Constitutional: No fever/chills Eyes: No visual changes. ENT: No sore throat. Cardiovascular: See history of present illness Respiratory: See history of present illness Gastrointestinal: See history of present illness  No nausea, no vomiting.  No diarrhea.  No constipation. Genitourinary: Negative for dysuria. Musculoskeletal: Negative for back pain. Skin: Negative for rash. Neurological: Negative for headaches,  focal weakness or numbness.  10-point ROS otherwise negative.  ____________________________________________   PHYSICAL EXAM:  VITAL SIGNS: ED Triage Vitals  Enc Vitals Group     BP 09/25/14 1604 183/88 mmHg     Pulse Rate 09/25/14 1604 94     Resp 09/25/14 1604 18     Temp 09/25/14 1604 98.8 F (37.1 C)     Temp Source 09/25/14 1604 Oral     SpO2 09/25/14 1604 96 %     Weight 09/25/14 1604 135 lb (61.236 kg)     Height 09/25/14 1604 5\' 2"  (1.575 m)     Head Cir --      Peak Flow --      Pain Score --      Pain Loc --      Pain Edu? --      Excl. in [place]? --     Constitutional: Alert and oriented. Well appearing and in no acute distress. Eyes: Conjunctivae are normal. PERRL. EOMI. Head: Atraumatic. Nose: No congestion/rhinnorhea. Mouth/Throat: Mucous membranes are moist.  Oropharynx non-erythematous. Neck: No stridor.  Cardiovascular: Normal rate, regular rhythm. Grossly normal heart sounds.  Good peripheral circulation. Respiratory: Normal respiratory effort.  No retractions. Lungs scattered crackles Gastrointestinal: Soft and nontender. No distention. No abdominal bruits. No CVA tenderness. Musculoskeletal: No lower extremity tenderness nor edema.  No joint effusions. Neurologic:  Normal speech and language. No gross focal neurologic deficits are appreciated. Cranial nerves II through XII are intact is little bit of facial droop on the right but this apparently is old. Cerebellar finger-nose rapid alternating movements and hands are normal sensation and strength intact throughout Skin:  Skin is warm, dry and intact. No rash noted. Psychiatric: Mood and affect are normal. Speech and behavior are normal.  ____________________________________________   LABS (all labs ordered are listed, but only abnormal results are displayed)  Labs Reviewed  BASIC METABOLIC PANEL - Abnormal; Notable for the following:    CO2 20 (*)    BUN 46 (*)    Creatinine, Ser 2.50 (*)    Calcium  8.5 (*)    GFR calc non Af Amer 16 (*)    GFR calc Af Amer 19 (*)    All other components within normal limits  CBC - Abnormal; Notable for the following:    RBC  3.20 (*)    Hemoglobin 9.6 (*)    HCT 28.0 (*)    RDW 14.6 (*)    All other components within normal limits  URINALYSIS COMPLETEWITH MICROSCOPIC ([facility] ONLY) - Abnormal; Notable for the following:    Color, Urine COLORLESS (*)    APPearance CLEAR (*)    Hgb urine dipstick 1+ (*)    Protein, ur 30 (*)    Squamous Epithelial / LPF 0-5 (*)    All other components within normal limits  BRAIN NATRIURETIC PEPTIDE - Abnormal; Notable for the following:    B Natriuretic Peptide 1343.0 (*)    All other components within normal limits  TROPONIN I - Abnormal; Notable for the following:    Troponin I 0.10 (*)    All other components within normal limits  LACTIC ACID, PLASMA  HEPATIC FUNCTION PANEL  LACTIC ACID, PLASMA  CBG MONITORING, ED   ____________________________________________  EKG  EKG read and interpreted by me shows normal sinus rhythm at a rate of 91 left axis left bundle-branch block. Similar to prior EKG ____________________________________________  RADIOLOGY  Chest x-ray shows Congestive heart failure ____________________________________________   PROCEDURES   ____________________________________________   INITIAL IMPRESSION / ASSESSMENT AND PLAN / ED COURSE  Pertinent labs & imaging results that were available during my care of the patient were reviewed by me and considered in my medical decision making (see chart for details).   ____________________________________________   FINAL CLINICAL IMPRESSION(S) / ED DIAGNOSES  Final diagnoses:  Elevated troponin  Acute systolic congestive heart failure      Nena Polio, MD 09/25/14 2132

## 2014-09-26 ENCOUNTER — Inpatient Hospital Stay
Admit: 2014-09-26 | Discharge: 2014-09-26 | Disposition: A | Discharge status: Home / Self Care | Payer: Medicare Other | Attending: Internal Medicine | Admitting: Internal Medicine

## 2014-09-26 LAB — CBC
HCT: 27.3 % — ABNORMAL LOW (ref 35.0–47.0)
Hemoglobin: 9.2 g/dL — ABNORMAL LOW (ref 12.0–16.0)
MCH: 29.3 pg (ref 26.0–34.0)
MCHC: 33.5 g/dL (ref 32.0–36.0)
MCV: 87.5 fL (ref 80.0–100.0)
Platelets: 176 10*3/uL (ref 150–440)
RBC: 3.12 MIL/uL — AB (ref 3.80–5.20)
RDW: 14.8 % — ABNORMAL HIGH (ref 11.5–14.5)
WBC: 4.6 10*3/uL (ref 3.6–11.0)

## 2014-09-26 LAB — BASIC METABOLIC PANEL
ANION GAP: 8 (ref 5–15)
BUN: 44 mg/dL — AB (ref 6–20)
CALCIUM: 8.6 mg/dL — AB (ref 8.9–10.3)
CHLORIDE: 109 mmol/L (ref 101–111)
CO2: 23 mmol/L (ref 22–32)
Creatinine, Ser: 2.48 mg/dL — ABNORMAL HIGH (ref 0.44–1.00)
GFR calc Af Amer: 19 mL/min — ABNORMAL LOW (ref >60)
GFR, EST NON AFRICAN AMERICAN: 17 mL/min — AB (ref >60)
Glucose, Bld: 105 mg/dL — ABNORMAL HIGH (ref 65–99)
Potassium: 4.3 mmol/L (ref 3.5–5.1)
SODIUM: 140 mmol/L (ref 135–145)

## 2014-09-26 LAB — TROPONIN I
Troponin I: 0.1 ng/mL — ABNORMAL HIGH (ref <0.031)
Troponin I: 0.12 ng/mL — ABNORMAL HIGH (ref <0.031)

## 2014-09-26 MED ORDER — LABETALOL HCL 5 MG/ML IV SOLN
10.0000 mg | INTRAVENOUS | Status: DC | PRN
Start: 2014-09-26 — End: 2014-09-26

## 2014-09-26 MED ORDER — SENNOSIDES-DOCUSATE SODIUM 8.6-50 MG PO TABS: 1.0000 | ORAL_TABLET | Freq: Every evening | ORAL | Status: DC | PRN

## 2014-09-26 MED ORDER — ENALAPRIL MALEATE 5 MG PO TABS
5.0000 mg | ORAL_TABLET | Freq: Every day | ORAL | Status: DC
  Administered 2014-09-26: 5 mg via ORAL
  Filled 2014-09-26: qty 1

## 2014-09-26 MED ORDER — DABIGATRAN ETEXILATE MESYLATE 75 MG PO CAPS
75.0000 mg | ORAL_CAPSULE | Freq: Two times a day (BID) | ORAL | Status: DC
  Administered 2014-09-26 (×2): 75 mg via ORAL
  Filled 2014-09-26 (×2): qty 1

## 2014-09-26 MED ORDER — ISOSORBIDE MONONITRATE ER 30 MG PO TB24: 30.0000 mg | ORAL_TABLET | Freq: Every day | ORAL | Status: DC

## 2014-09-26 MED ORDER — ACETAMINOPHEN 650 MG RE SUPP: 650.0000 mg | Freq: Four times a day (QID) | RECTAL | Status: DC | PRN

## 2014-09-26 MED ORDER — ACETAMINOPHEN 325 MG PO TABS: 650.0000 mg | ORAL_TABLET | Freq: Four times a day (QID) | ORAL | Status: DC | PRN

## 2014-09-26 MED ORDER — TORSEMIDE 20 MG PO TABS: 20.0000 mg | ORAL_TABLET | Freq: Every day | ORAL | Status: DC

## 2014-09-26 MED ORDER — SODIUM BICARBONATE 650 MG PO TABS
650.0000 mg | ORAL_TABLET | Freq: Two times a day (BID) | ORAL | Status: DC
  Administered 2014-09-26 (×2): 650 mg via ORAL
  Filled 2014-09-26 (×3): qty 1

## 2014-09-26 MED ORDER — ONDANSETRON HCL 4 MG PO TABS: 4.0000 mg | ORAL_TABLET | Freq: Four times a day (QID) | ORAL | Status: DC | PRN

## 2014-09-26 MED ORDER — METOPROLOL TARTRATE 50 MG PO TABS
50.0000 mg | ORAL_TABLET | Freq: Every day | ORAL | Status: DC
  Administered 2014-09-26: 50 mg via ORAL
  Filled 2014-09-26: qty 1

## 2014-09-26 MED ORDER — TORSEMIDE 5 MG PO TABS
10.0000 mg | ORAL_TABLET | Freq: Every day | ORAL | Status: DC
  Administered 2014-09-26: 10 mg via ORAL
  Filled 2014-09-26: qty 2

## 2014-09-26 MED ORDER — LEVOTHYROXINE SODIUM 75 MCG PO TABS
75.0000 ug | ORAL_TABLET | Freq: Every day | ORAL | Status: DC
  Administered 2014-09-26: 75 ug via ORAL
  Filled 2014-09-26: qty 1

## 2014-09-26 MED ORDER — ONDANSETRON HCL 4 MG/2ML IJ SOLN: 4.0000 mg | Freq: Four times a day (QID) | INTRAMUSCULAR | Status: DC | PRN

## 2014-09-26 MED ORDER — HYDRALAZINE HCL 25 MG PO TABS: 25.0000 mg | ORAL_TABLET | Freq: Three times a day (TID) | ORAL | Status: DC

## 2014-09-26 MED ORDER — SODIUM CHLORIDE 0.9 % IJ SOLN
3.0000 mL | Freq: Two times a day (BID) | INTRAMUSCULAR | Status: DC
  Administered 2014-09-26 (×2): 3 mL via INTRAVENOUS

## 2014-09-26 MED ORDER — FESOTERODINE FUMARATE ER 8 MG PO TB24
8.0000 mg | ORAL_TABLET | Freq: Every day | ORAL | Status: DC
  Administered 2014-09-26: 8 mg via ORAL
  Filled 2014-09-26: qty 1

## 2014-09-26 MED ORDER — AMLODIPINE BESYLATE 5 MG PO TABS
5.0000 mg | ORAL_TABLET | Freq: Every day | ORAL | Status: DC
  Administered 2014-09-26: 5 mg via ORAL
  Filled 2014-09-26: qty 1

## 2014-09-26 NOTE — Discharge Instructions (Signed)

## 2014-09-26 NOTE — Progress Notes (Signed)
Dr. Leslye Peer aware that we are waiting for cardiology consult. Per Dr. Leslye Peer, go ahead and place patient on heart healthy diet. MD to round shortly. Kerry Walsh

## 2014-09-26 NOTE — Consult Note (Signed)
Reason for Consult: congestive heart failure ,shortness of breath ,angina Referring Physician:  Dr. Jannifer Franklin hospitalist, Dr.  Ouida Sills  primary  Kerry Walsh is an 81 y.o. female.  HPI:  81 year old white female history of atrial fibrillation chronic renal insufficiency hypertension  Who presented with shortness of breath chest discomfort. Patient had progressive shortness of breath elevated troponin 0.1.  Patient has elevated BNP normal left ventricular function in the past but now with progressive shortness of breath chest x-ray suggested heart failure denies any leg swelling moderate chest discomfort which has since resolved. Patient denies any cardiac history   besidesI atrial fibrillation. No blackout spells of syncope no sputum production no significant cough no fever chills or sweats.  Past Medical History  Diagnosis Date  . Hypertension   . Dysrhythmia     Atrial Fibrillation  . Cancer     Carcinoid Tumor  . Stroke     No residual neurological deficits  . Chronic kidney disease     CKD with last known creatinine fo 2.4 in Dec 2015  . 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 Abdominal hysterectomy    . Appendectomy    . Cholecystectomy    . Eye surgery    . Joint replacement      Knee replacement surgery- bilateral    Family History  Problem Relation Age of Onset  . CAD Father     Social History:  reports that she has never smoked. She does not have any smokeless tobacco history on file. She reports that she does not drink alcohol or use illicit drugs.  Allergies:  Allergies  Allergen Reactions  . Penicillins Rash    Medications: I have reviewed the patient's current medications.  Results for orders placed or performed during the hospital encounter of 09/25/14 (from the past 48 hour(s))  Basic metabolic panel     Status: Abnormal   Collection Time: 09/25/14  4:17 PM  Result  Value Ref Range   Sodium 135 135 - 145 mmol/L   Potassium 4.1 3.5 - 5.1 mmol/L   Chloride 105 101 - 111 mmol/L   CO2 20 (L) 22 - 32 mmol/L   Glucose, Bld 86 65 - 99 mg/dL   BUN 46 (H) 6 - 20 mg/dL   Creatinine, Ser 2.50 (H) 0.44 - 1.00 mg/dL   Calcium 8.5 (L) 8.9 - 10.3 mg/dL   GFR calc non Af Amer 16 (L) >60 mL/min   GFR calc Af Amer 19 (L) >60 mL/min    Comment: (NOTE) The eGFR has been calculated using the CKD EPI equation. This calculation has not been validated in all clinical situations. eGFR's persistently <60 mL/min signify possible Chronic Kidney Disease.    Anion gap 10 5 - 15  CBC     Status: Abnormal   Collection Time: 09/25/14  4:17 PM  Result Value Ref Range   WBC 4.5 3.6 - 11.0 K/uL   RBC 3.20 (L) 3.80 - 5.20 MIL/uL   Hemoglobin 9.6 (L) 12.0 - 16.0 g/dL   HCT 28.0 (L) 35.0 - 47.0 %   MCV 87.6 80.0 - 100.0 fL   MCH 29.9 26.0 - 34.0 pg   MCHC 34.2 32.0 - 36.0 g/dL   RDW 14.6 (H) 11.5 - 14.5 %   Platelets 190 150 - 440 K/uL  Urinalysis complete, with microscopic ([facility] only)     Status: Abnormal   Collection Time: 09/25/14  4:17  PM  Result Value Ref Range   Color, Urine COLORLESS (A) YELLOW   APPearance CLEAR (A) CLEAR   Glucose, UA NEGATIVE NEGATIVE mg/dL   Bilirubin Urine NEGATIVE NEGATIVE   Ketones, ur NEGATIVE NEGATIVE mg/dL   Specific Gravity, Urine 1.005 1.005 - 1.030   Hgb urine dipstick 1+ (A) NEGATIVE   pH 6.0 5.0 - 8.0   Protein, ur 30 (A) NEGATIVE mg/dL   Nitrite NEGATIVE NEGATIVE   Leukocytes, UA NEGATIVE NEGATIVE   RBC / HPF 0-5 0 - 5 RBC/hpf   WBC, UA NONE SEEN 0 - 5 WBC/hpf   Bacteria, UA NONE SEEN NONE SEEN   Squamous Epithelial / LPF 0-5 (A) NONE SEEN  Brain natriuretic peptide     Status: Abnormal   Collection Time: 09/25/14  8:03 PM  Result Value Ref Range   B Natriuretic Peptide 1343.0 (H) 0.0 - 100.0 pg/mL  Troponin I     Status: Abnormal   Collection Time: 09/25/14  8:03 PM  Result Value Ref Range   Troponin I 0.10 (H) <0.031  ng/mL    Comment: READ BACK AND VERIFIED WITH STEVEN JONES AT 2045 09/25/14.Marland KitchenMarland KitchenSMG        PERSISTENTLY INCREASED TROPONIN VALUES IN THE RANGE OF 0.04-0.49 ng/mL CAN BE SEEN IN:       -UNSTABLE ANGINA       -CONGESTIVE HEART FAILURE       -MYOCARDITIS       -CHEST TRAUMA       -ARRYHTHMIAS       -LATE PRESENTING MYOCARDIAL INFARCTION       -COPD   CLINICAL FOLLOW-UP RECOMMENDED.   Lactic acid, plasma     Status: None   Collection Time: 09/25/14  8:03 PM  Result Value Ref Range   Lactic Acid, Venous 1.1 0.5 - 2.0 mmol/L  Hepatic function panel     Status: None   Collection Time: 09/25/14  8:03 PM  Result Value Ref Range   Total Protein 6.5 6.5 - 8.1 g/dL   Albumin 3.7 3.5 - 5.0 g/dL   AST 30 15 - 41 U/L   ALT 30 14 - 54 U/L   Alkaline Phosphatase 75 38 - 126 U/L   Total Bilirubin 0.7 0.3 - 1.2 mg/dL   Bilirubin, Direct 0.1 0.1 - 0.5 mg/dL   Indirect Bilirubin 0.6 0.3 - 0.9 mg/dL  Lactic acid, plasma     Status: None   Collection Time: 09/25/14 11:08 PM  Result Value Ref Range   Lactic Acid, Venous 0.9 0.5 - 2.0 mmol/L  Glucose, capillary     Status: None   Collection Time: 09/25/14 11:12 PM  Result Value Ref Range   Glucose-Capillary 98 65 - 99 mg/dL  Troponin I     Status: Abnormal   Collection Time: 09/26/14  1:03 AM  Result Value Ref Range   Troponin I 0.10 (H) <0.031 ng/mL    Comment: RESULTS PREVIOUSLY CALLED BY [facility] AT 2045 ON 09/25/14 RWW        PERSISTENTLY INCREASED TROPONIN VALUES IN THE RANGE OF 0.04-0.49 ng/mL CAN BE SEEN IN:       -UNSTABLE ANGINA       -CONGESTIVE HEART FAILURE       -MYOCARDITIS       -CHEST TRAUMA       -ARRYHTHMIAS       -LATE PRESENTING MYOCARDIAL INFARCTION       -COPD   CLINICAL FOLLOW-UP RECOMMENDED.   Troponin I  Status: Abnormal   Collection Time: 09/26/14  5:57 AM  Result Value Ref Range   Troponin I 0.12 (H) <0.031 ng/mL    Comment: RESULTS PREVIOUSLY CALLED BY [facility] AT 2045 ON 09/25/14 RWW        PERSISTENTLY INCREASED  TROPONIN VALUES IN THE RANGE OF 0.04-0.49 ng/mL CAN BE SEEN IN:       -UNSTABLE ANGINA       -CONGESTIVE HEART FAILURE       -MYOCARDITIS       -CHEST TRAUMA       -ARRYHTHMIAS       -LATE PRESENTING MYOCARDIAL INFARCTION       -COPD   CLINICAL FOLLOW-UP RECOMMENDED.   Basic metabolic panel     Status: Abnormal   Collection Time: 09/26/14  5:57 AM  Result Value Ref Range   Sodium 140 135 - 145 mmol/L   Potassium 4.3 3.5 - 5.1 mmol/L   Chloride 109 101 - 111 mmol/L   CO2 23 22 - 32 mmol/L   Glucose, Bld 105 (H) 65 - 99 mg/dL   BUN 44 (H) 6 - 20 mg/dL   Creatinine, Ser 2.48 (H) 0.44 - 1.00 mg/dL   Calcium 8.6 (L) 8.9 - 10.3 mg/dL   GFR calc non Af Amer 17 (L) >60 mL/min   GFR calc Af Amer 19 (L) >60 mL/min    Comment: (NOTE) The eGFR has been calculated using the CKD EPI equation. This calculation has not been validated in all clinical situations. eGFR's persistently <60 mL/min signify possible Chronic Kidney Disease.    Anion gap 8 5 - 15  CBC     Status: Abnormal   Collection Time: 09/26/14  5:57 AM  Result Value Ref Range   WBC 4.6 3.6 - 11.0 K/uL   RBC 3.12 (L) 3.80 - 5.20 MIL/uL   Hemoglobin 9.2 (L) 12.0 - 16.0 g/dL   HCT 27.3 (L) 35.0 - 47.0 %   MCV 87.5 80.0 - 100.0 fL   MCH 29.3 26.0 - 34.0 pg   MCHC 33.5 32.0 - 36.0 g/dL   RDW 14.8 (H) 11.5 - 14.5 %   Platelets 176 150 - 440 K/uL    Dg Chest 2 View  09/25/2014   CLINICAL DATA:  Shortness of breath with weakness for 4 days. History of atrial fibrillation, stroke and cancer. Initial encounter.  EXAM: CHEST  2 VIEW  COMPARISON:  06/28/2014 and 01/03/2014.  FINDINGS: The heart size and mediastinal contours are stable with mild aortic atherosclerosis. There is new vascular congestion with interstitial pulmonary edema, bibasilar atelectasis and probable small bilateral pleural effusions. There is no confluent airspace opacity. Patient is status post left shoulder arthroplasty and lumbar fusion. No acute osseous findings  evident.  IMPRESSION: Interstitial pulmonary edema with small pleural effusions consistent with congestive heart failure.   Electronically Signed   By: Richardean Sale M.D.   On: 09/25/2014 20:21    Review of Systems  Constitutional: Positive for malaise/fatigue.  HENT: Positive for congestion.   Eyes: Negative.   Respiratory: Positive for shortness of breath.   Cardiovascular: Positive for chest pain and palpitations.  Gastrointestinal: Negative.   Genitourinary: Negative.   Musculoskeletal: Negative.   Skin: Negative.   Neurological: Negative.   Endo/Heme/Allergies: Negative.   Psychiatric/Behavioral: Negative.    Blood pressure 134/62, pulse 64, temperature 97.7 F (36.5 C), temperature source Oral, resp. rate 20, height 5' 2"  (1.575 m), weight 59.512 kg (131 lb 3.2 oz), SpO2 94 %. Physical  Exam  Nursing note and vitals reviewed. Constitutional: She is oriented to person, place, and time. She appears well-developed and well-nourished.  HENT:  Head: Normocephalic and atraumatic.  Right Ear: External ear normal.  Eyes: Conjunctivae and EOM are normal. Pupils are equal, round, and reactive to light.  Neck: Normal range of motion. Neck supple.  Cardiovascular: S1 normal, S2 normal, intact distal pulses and normal pulses.  An irregularly irregular rhythm present. Exam reveals gallop, S3 and S4.   Murmur heard.  Systolic murmur is present with a grade of 3/6  Respiratory: Effort normal and breath sounds normal.  GI: Soft. Bowel sounds are normal.  Musculoskeletal: Normal range of motion.  Neurological: She is alert and oriented to person, place, and time. She has normal reflexes.  Skin: Skin is warm.  Psychiatric: She has a normal mood and affect.    Assessment/Plan:  congestive heart failure  murmur  shortness of breath  borderline troponin  atrial fibrillation  fatigued  hypertension  abnormal EKG  chronic renal insufficiency  anemia . PLAN  agree with ROMI/  myocardial infarction  possible angina recommend nitrates and beta-blockers  heart failure consider afterload reduction recommend hydralazine Imdur  Because of renal insufficiency  continue diuretic therapy with Lasix  continue hypertension control beta-blocker hydralazine Imdur   would probably avoid ACE-inhibitor or an ARB use for now because of renal insufficiency  evaluate for coronary disease possibly with a functional study  echocardiogram with severe mitral regurgitation  continue anticoagulation for atrial fibrillation  consider functional study versus cardiac catheterization for further evaluation  agree with rate control with metoprolol   Karan Ramnauth D. 09/26/2014, 1:05 PM

## 2014-09-26 NOTE — Discharge Summary (Signed)
[facility] [place] NAME: Kerry Walsh    MR#:  [id number]  DATE OF BIRTH:  October 27, 1925  DATE OF ADMISSION:  09/25/2014 ADMITTING PHYSICIAN: Lance Coon, MD  DATE OF DISCHARGE: 09/26/2014  PRIMARY CARE PHYSICIAN: Kirk Ruths., MD    ADMISSION DIAGNOSIS:  Elevated troponin [H47.65] Acute systolic congestive heart failure [I50.21]  DISCHARGE DIAGNOSIS:  Principal Problem:   Acute CHF Active Problems:   Atrial fibrillation   Arteriosclerosis of coronary artery   Chronic kidney disease (CKD), stage IV (severe)   Adult hypothyroidism   Elevated troponin   HTN (hypertension)   SECONDARY DIAGNOSIS:   Past Medical History  Diagnosis Date  . Hypertension   . Dysrhythmia     Atrial Fibrillation  . Cancer     Carcinoid Tumor  . Stroke     No residual neurological deficits  . Chronic kidney disease     CKD with last known creatinine fo 2.4 in Dec 2015  . 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 Acute systolic congestive heart failure. Patient was diuresis with 1 dose of IV Lasix and feeling better with regards to her breathing. I will increase her dose of torsemide to 20 mg daily. Continue metoprolol and enalapril. I will not prescribe Aldactone secondary to a GFR being less than 30. Can consider an entresto as outpatient but that would require a washout period with the enalapril. 2. Elevated troponin- cardiology recommended outpatient stress test. 3. Chronic atrial fibrillation rate controlled with metoprolol and anticoagulated with Pradaxa. 4. Chronic kidney disease stage IV- follows up with nephrology as outpatient 5. Essential hypertension- continue usual medications. 6. Anemia likely of chronic disease follow-up with nephrology as outpatient.  DISCHARGE CONDITIONS:   Satisfactory  CONSULTS OBTAINED:  Treatment Team:  Yolonda Kida, MD  DRUG  ALLERGIES:   Allergies  Allergen Reactions  . Penicillins Rash    DISCHARGE MEDICATIONS:   Current Discharge Medication List    CONTINUE these medications which have CHANGED   Details  torsemide (DEMADEX) 20 MG tablet Take 1 tablet (20 mg total) by mouth daily. Qty: 30 tablet, Refills: 0      CONTINUE these medications which have NOT CHANGED   Details  amLODipine (NORVASC) 5 MG tablet Take 5 mg by mouth daily.    calcitRIOL (ROCALTROL) 0.25 MCG capsule Take 0.25 mcg by mouth daily.     ciprofloxacin (CIPRO) 500 MG tablet Take 500 mg by mouth at bedtime.     dabigatran (PRADAXA) 75 MG CAPS capsule Take 75 mg by mouth 2 (two) times daily.    enalapril (VASOTEC) 5 MG tablet Take 5 mg by mouth daily.     levothyroxine (SYNTHROID, LEVOTHROID) 75 MCG tablet Take 75 mcg by mouth daily before breakfast.    metoprolol (LOPRESSOR) 50 MG tablet Take 50 mg by mouth daily.    sodium bicarbonate 650 MG tablet Take 650 mg by mouth 2 (two) times daily.    tolterodine (DETROL LA) 4 MG 24 hr capsule Take 4 mg by mouth daily.      STOP taking these medications     sodium chloride 1 G tablet          DISCHARGE INSTRUCTIONS:   Follow-up with Dr. Frazier Richards 1 week. Follow-up with Dr. Clayborn Bigness cardiology 1 week.   If you experience worsening of your admission symptoms, develop shortness of breath, life threatening emergency, suicidal or homicidal thoughts you  must seek medical attention immediately by calling 911 or calling your MD immediately  if symptoms less severe.  You Must read complete instructions/literature along with all the possible adverse reactions/side effects for all the Medicines you take and that have been prescribed to you. Take any new Medicines after you have completely understood and accept all the possible adverse reactions/side effects.   Please note  You were cared for by a hospitalist during your hospital stay. If you have any questions about your  discharge medications or the care you received while you were in the hospital after you are discharged, you can call the unit and asked to speak with the hospitalist on call if the hospitalist that took care of you is not available. Once you are discharged, your primary care physician will handle any further medical issues. Please note that NO REFILLS for any discharge medications will be authorized once you are discharged, as it is imperative that you return to your primary care physician (or establish a relationship with a primary care physician if you do not have one) for your aftercare needs so that they can reassess your need for medications and monitor your lab values.    Today   CHIEF COMPLAINT:   Chief Complaint  Patient presents with  . Fatigue    HISTORY OF PRESENT ILLNESS:  Kerry Walsh  is a 81 y.o. female with a known history of chronic kidney disease, hypertension and atrial fibrillation. She presents with fatigue chest discomfort and shortness of breath found to be in heart failure.   VITAL SIGNS:  Blood pressure 146/76, pulse 79, temperature 98.2 F (36.8 C), temperature source Oral, resp. rate 20, height 5\' 2"  (1.575 m), weight 59.512 kg (131 lb 3.2 oz), SpO2 94 %.  I/O:   Intake/Output Summary (Last 24 hours) at 09/26/14 1140 Last data filed at 09/26/14 0812  Gross per 24 hour  Intake      3 ml  Output   1200 ml  Net  -1197 ml    PHYSICAL EXAMINATION:  GENERAL:  81 y.o.-year-old patient lying in the bed with no acute distress.  EYES: Pupils equal, round, reactive to light and accommodation. No scleral icterus. Extraocular muscles intact.  HEENT: Head atraumatic, normocephalic. Oropharynx and nasopharynx clear.  NECK:  Supple, no jugular venous distention. No thyroid enlargement, no tenderness.  LUNGS: Normal breath sounds bilaterally, no wheezing, rales,rhonchi or crepitation. No use of accessory muscles of respiration.  CARDIOVASCULAR: S1, S2 irregularly  irregular. 2/6 systolic  murmurs,  no rubs, or gallops.  ABDOMEN: Soft, non-tender, non-distended. Bowel sounds present. No organomegaly or mass.  EXTREMITIES: No pedal edema, cyanosis, or clubbing.  NEUROLOGIC: Cranial nerves II through XII are intact. Muscle strength 5/5 in all extremities. Sensation intact. Gait not checked.  PSYCHIATRIC: The patient is alert and oriented x 3.  SKIN: No obvious rash, lesion, or ulcer.   DATA REVIEW:   CBC  Recent Labs Lab 09/26/14 0557  WBC 4.6  HGB 9.2*  HCT 27.3*  PLT 176    Chemistries   Recent Labs Lab 09/25/14 2003 09/26/14 0557  NA  --  140  K  --  4.3  CL  --  109  CO2  --  23  GLUCOSE  --  105*  BUN  --  44*  CREATININE  --  2.48*  CALCIUM  --  8.6*  AST 30  --   ALT 30  --   ALKPHOS 75  --   BILITOT  0.7  --     Cardiac Enzymes  Recent Labs Lab 09/26/14 0557  TROPONINI 0.12*    RADIOLOGY:  Dg Chest 2 View  09/25/2014   CLINICAL DATA:  Shortness of breath with weakness for 4 days. History of atrial fibrillation, stroke and cancer. Initial encounter.  EXAM: CHEST  2 VIEW  COMPARISON:  06/28/2014 and 01/03/2014.  FINDINGS: The heart size and mediastinal contours are stable with mild aortic atherosclerosis. There is new vascular congestion with interstitial pulmonary edema, bibasilar atelectasis and probable small bilateral pleural effusions. There is no confluent airspace opacity. Patient is status post left shoulder arthroplasty and lumbar fusion. No acute osseous findings evident.  IMPRESSION: Interstitial pulmonary edema with small pleural effusions consistent with congestive heart failure.   Electronically Signed   By: Richardean Sale M.D.   On: 09/25/2014 20:21    Management plans discussed with the patient, family and they are in agreement.  CODE STATUS:     Code Status Orders        Start     Ordered   09/26/14 0020  Full code   Continuous     09/26/14 0019      TOTAL TIME TAKING CARE OF THIS PATIENT:  42 Minutes, Greater than 50% time spent in coordination of care and discharge disposition.   Loletha Grayer M.D on 09/26/2014 at 11:40 AM  Between 7am to 6pm - Pager - [phone]  After 6pm go to [email or web address] - password [email or web address] [facility]  Office  [phone]  CC: Primary care physician; Kirk Ruths., MD

## 2014-09-26 NOTE — Progress Notes (Signed)
Patient given discharge teaching and paperwork regarding medications, diet, follow-up appointments and activity. Patient understanding verbalized. No complaints at this time. IV and telemetry discontinued prior to leaving. Skin assessment as previously charted and vitals are stable. Patient being discharged to home. Caregiver/family present during discharge teaching. No needs by Care Management.  Toniann Ket

## 2014-09-28 ENCOUNTER — Encounter: Payer: Self-pay | Admitting: Emergency Medicine

## 2014-09-28 ENCOUNTER — Other Ambulatory Visit: Payer: Self-pay

## 2014-09-28 ENCOUNTER — Emergency Department: Payer: Medicare Other

## 2014-09-28 ENCOUNTER — Emergency Department
Admission: EM | Admit: 2014-09-28 | Discharge: 2014-09-28 | Disposition: A | Discharge status: Home / Self Care | Payer: Medicare Other | Attending: Internal Medicine | Admitting: Internal Medicine

## 2014-09-28 DIAGNOSIS — Z8679 Personal history of other diseases of the circulatory system: Secondary | ICD-10-CM | POA: Diagnosis not present

## 2014-09-28 DIAGNOSIS — I129 Hypertensive chronic kidney disease with stage 1 through stage 4 chronic kidney disease, or unspecified chronic kidney disease: Secondary | ICD-10-CM | POA: Diagnosis not present

## 2014-09-28 DIAGNOSIS — E039 Hypothyroidism, unspecified: Secondary | ICD-10-CM | POA: Diagnosis not present

## 2014-09-28 DIAGNOSIS — I509 Heart failure, unspecified: Secondary | ICD-10-CM

## 2014-09-28 DIAGNOSIS — R0602 Shortness of breath: Secondary | ICD-10-CM

## 2014-09-28 HISTORY — DX: Heart failure, unspecified: I50.9

## 2014-09-28 LAB — COMPREHENSIVE METABOLIC PANEL
ALK PHOS: 62 U/L (ref 38–126)
ALT: 18 U/L (ref 14–54)
AST: 22 U/L (ref 15–41)
Albumin: 3.6 g/dL (ref 3.5–5.0)
Anion gap: 9 (ref 5–15)
BUN: 54 mg/dL — ABNORMAL HIGH (ref 6–20)
CALCIUM: 8.2 mg/dL — AB (ref 8.9–10.3)
CO2: 22 mmol/L (ref 22–32)
CREATININE: 3.22 mg/dL — AB (ref 0.44–1.00)
Chloride: 108 mmol/L (ref 101–111)
GFR calc Af Amer: 14 mL/min — ABNORMAL LOW (ref >60)
GFR calc non Af Amer: 12 mL/min — ABNORMAL LOW (ref >60)
GLUCOSE: 122 mg/dL — AB (ref 65–99)
POTASSIUM: 4.5 mmol/L (ref 3.5–5.1)
Sodium: 139 mmol/L (ref 135–145)
TOTAL PROTEIN: 6.1 g/dL — AB (ref 6.5–8.1)
Total Bilirubin: 0.1 mg/dL — ABNORMAL LOW (ref 0.3–1.2)

## 2014-09-28 LAB — CBC WITH DIFFERENTIAL/PLATELET
BASOS PCT: 2 %
Basophils Absolute: 0.1 10*3/uL (ref 0–0.1)
EOS ABS: 0.2 10*3/uL (ref 0–0.7)
EOS PCT: 4 %
HCT: 25 % — ABNORMAL LOW (ref 35.0–47.0)
HEMOGLOBIN: 8.6 g/dL — AB (ref 12.0–16.0)
Lymphocytes Relative: 28 %
Lymphs Abs: 1.2 10*3/uL (ref 1.0–3.6)
MCH: 29.9 pg (ref 26.0–34.0)
MCHC: 34.2 g/dL (ref 32.0–36.0)
MCV: 87.5 fL (ref 80.0–100.0)
MONOS PCT: 8 %
Monocytes Absolute: 0.3 10*3/uL (ref 0.2–0.9)
Neutro Abs: 2.6 10*3/uL (ref 1.4–6.5)
Neutrophils Relative %: 60 %
Platelets: 172 10*3/uL (ref 150–440)
RBC: 2.86 MIL/uL — ABNORMAL LOW (ref 3.80–5.20)
RDW: 14.4 % (ref 11.5–14.5)
WBC: 4.5 10*3/uL (ref 3.6–11.0)

## 2014-09-28 LAB — BRAIN NATRIURETIC PEPTIDE: B NATRIURETIC PEPTIDE 5: 589 pg/mL — AB (ref 0.0–100.0)

## 2014-09-28 LAB — TROPONIN I: Troponin I: 0.07 ng/mL — ABNORMAL HIGH (ref <0.031)

## 2014-09-28 MED ORDER — FUROSEMIDE 10 MG/ML IJ SOLN
40.0000 mg | Freq: Once | INTRAMUSCULAR | Status: AC
  Administered 2014-09-28: 40 mg via INTRAVENOUS
  Filled 2014-09-28: qty 4

## 2014-09-28 MED ORDER — METOPROLOL TARTRATE 50 MG PO TABS: 50.0000 mg | ORAL_TABLET | Freq: Two times a day (BID) | ORAL | Status: AC

## 2014-09-28 NOTE — ED Notes (Signed)
EMS pt from home with c/o shortness of breath; pt was admitted to [facility] Friday night for CHF and discharged Saturday; pt was given a RX for lasix which pt was unaware of and therefore did not get it filled; pt says she woke around midnight with worsening shortness of breath; pt arrives with 20 gauge saline lock in left hand; sats 96-97 on room air; pt denies chest pain at this time; alert and oriented x 3;

## 2014-09-28 NOTE — Consult Note (Signed)
[facility] Consultation Note  Patient ID: Kerry Walsh, MRN: [id number], DOB/AGE: October 31, 1925 81 y.o. Admit date: 09/28/2014   Date of Consult: 09/28/2014 Primary Physician: Kirk Ruths., MD Primary Cardiologist: Bluford Kaufmann  Chief Complaint:  Chief Complaint  Patient presents with  . Shortness of Breath   Reason for Consult: acute on chronic systolic dysfunction congestive heart failure with left bundle-branch block and shortness of breath  HPI: 81 y.o. female with the known real fibrillation with controlled ventricular rate on appropriate medication management chronic kidney disease stage III with mild to moderate LV systolic dysfunction with chronic systolic dysfunction heart failure with acute on chronic shortness of breath increasing in the middle of night and making her come to the emergency room. The patient had significant improvements of shortness of breath with her generation and now is comfortable with some basilar rails. The BNP was 589 and the patient has had significant anemia had hemoglobin of 8.6. She did have a troponin of 0.07 consistent with demand ischemia rather than acute coronary syndrome. The patient does have severe mitral regurgitation and aortic insufficiency as well which may contribute to her shortness of breath. Atrial fibrillation has been controlled with appropriate medications and that it is likely that her shortness of breath is most likely to 2 functional aspects of issues above. The patient has had a previous stroke but no evidence of coronary artery disease and there has been recent stress test within the last 6 months out evidence of myocardial ischemia  Past Medical History  Diagnosis Date  . Hypertension   . Dysrhythmia     Atrial Fibrillation  . Cancer     Carcinoid Tumor  . Stroke     No residual neurological deficits  . Chronic kidney disease     CKD with last known creatinine fo 2.4 in Dec 2015  . 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 Abdominal hysterectomy    . Cholecystectomy    . Eye surgery    . Joint replacement      Knee replacement surgery- bilateral     Home Meds: Prior to Admission medications   Medication Sig Start Date End Date Taking? Authorizing Provider  sodium chloride 1 G tablet Take 1 g by mouth 3 (three) times daily.   Yes Historical Provider, MD  amLODipine (NORVASC) 5 MG tablet Take 5 mg by mouth daily.    Historical Provider, MD  calcitRIOL (ROCALTROL) 0.25 MCG capsule Take 0.25 mcg by mouth daily.     Historical Provider, MD  ciprofloxacin (CIPRO) 500 MG tablet Take 500 mg by mouth at bedtime.     Historical Provider, MD  dabigatran (PRADAXA) 75 MG CAPS capsule Take 75 mg by mouth 2 (two) times daily.    Historical Provider, MD  enalapril (VASOTEC) 5 MG tablet Take 5 mg by mouth daily.     Historical Provider, MD  levothyroxine (SYNTHROID, LEVOTHROID) 75 MCG tablet Take 75 mcg by mouth daily before breakfast.    Historical Provider, MD  metoprolol (LOPRESSOR) 50 MG tablet Take 50 mg by mouth daily.    Historical Provider, MD  sodium bicarbonate 650 MG tablet Take 650 mg by mouth 2 (two) times daily.    Historical Provider, MD  tolterodine (DETROL LA) 4 MG 24 hr capsule Take 4 mg by mouth daily.    Historical Provider, MD  torsemide (DEMADEX) 20 MG tablet Take  1 tablet (20 mg total) by mouth daily. 09/26/14   Loletha Grayer, MD    Inpatient Medications:     Allergies:  Allergies  Allergen Reactions  . Penicillins Rash    History   Social History  . Marital Status: Married    Spouse Name: N/A  . Number of Children: N/A  . Years of Education: N/A   Occupational History  . Not on file.   Social History Main Topics  . Smoking status: Never Smoker   . Smokeless tobacco: Not on file  . Alcohol Use: No  . Drug Use: No  . Sexual Activity: No   Other  Topics Concern  . Not on file   Social History Narrative     Family History  Problem Relation Age of Onset  . CAD Father      Review of Systems Positive for shortness of breath Negative for: General:  chills, fever, night sweats or weight changes.  Cardiovascular:  syncope dizziness  Dermatological skin lesions rashes Respiratory: Cough congestion Urologic: Frequent urination urination at night and hematuria Abdominal: negative for nausea, vomiting, diarrhea, bright red blood per rectum, melena, or hematemesis Neurologic: negative for visual changes, and/or hearing changes  All other systems reviewed and are otherwise negative except as noted above.  Labs:  Recent Labs  09/25/14 2003 09/26/14 0103 09/26/14 0557 09/28/14 0445  TROPONINI 0.10* 0.10* 0.12* 0.07*   Lab Results  Component Value Date   WBC 4.5 09/28/2014   HGB 8.6* 09/28/2014   HCT 25.0* 09/28/2014   MCV 87.5 09/28/2014   PLT 172 09/28/2014    Recent Labs Lab 09/28/14 0445  NA 139  K 4.5  CL 108  CO2 22  BUN 54*  CREATININE 3.22*  CALCIUM 8.2*  PROT 6.1*  BILITOT 0.1*  ALKPHOS 62  ALT 18  AST 22  GLUCOSE 122*   No results found for: CHOL, HDL, LDLCALC, TRIG No results found for: DDIMER  Radiology/Studies:  Dg Chest 2 View  09/28/2014   CLINICAL DATA:  Acute onset of shortness of breath. Initial encounter.  EXAM: CHEST  2 VIEW  COMPARISON:  Chest radiograph performed 09/25/2014  FINDINGS: The lungs are well-aerated. Vascular congestion is noted, with mildly increased interstitial markings, likely reflecting minimal interstitial edema. Small bilateral pleural effusions are seen. No pneumothorax is seen.  The heart is borderline normal in size. No acute osseous abnormalities are seen. The patient's left shoulder arthroplasty is incompletely imaged but appears grossly unremarkable.  IMPRESSION: Vascular congestion, with mildly increased interstitial markings, likely reflecting minimal interstitial  edema. Small bilateral pleural effusions seen.   Electronically Signed   By: Garald Balding M.D.   On: 09/28/2014 05:52   Dg Chest 2 View  09/25/2014   CLINICAL DATA:  Shortness of breath with weakness for 4 days. History of atrial fibrillation, stroke and cancer. Initial encounter.  EXAM: CHEST  2 VIEW  COMPARISON:  06/28/2014 and 01/03/2014.  FINDINGS: The heart size and mediastinal contours are stable with mild aortic atherosclerosis. There is new vascular congestion with interstitial pulmonary edema, bibasilar atelectasis and probable small bilateral pleural effusions. There is no confluent airspace opacity. Patient is status post left shoulder arthroplasty and lumbar fusion. No acute osseous findings evident.  IMPRESSION: Interstitial pulmonary edema with small pleural effusions consistent with congestive heart failure.   Electronically Signed   By: Richardean Sale M.D.   On: 09/25/2014 20:21    EKG: Atrial fibrillation with left bundle branch block  Weights: Filed  Weights   09/28/14 0425  Weight: 135 lb (61.236 kg)     Physical Exam: Blood pressure 137/72, pulse 70, temperature 98 F (36.7 C), temperature source Oral, resp. rate 20, height 5\' 2"  (1.575 m), weight 135 lb (61.236 kg), SpO2 93 %. Body mass index is 24.69 kg/(m^2). General: Well developed, well nourished, in no acute distress. Head eyes ears nose throat: Normocephalic, atraumatic, sclera non-icteric, no xanthomas, nares are without discharge. No apparent thyromegaly and/or mass  Lungs: Normal respiratory effort.  no wheezes, basilar nrales, no rhonchi.  Heart: Irregular with normal S1 soft S2. 3 out of 6 right upper sternal murmur gallop, no rub, PMI is normal size and placement, carotid upstroke normal without bruit, jugular venous pressure is normal Abdomen: Soft, non-tender, non-distended with normoactive bowel sounds. No hepatomegaly. No rebound/guarding. No obvious abdominal masses. Abdominal aorta is normal size without  bruit Extremities: Trace edema. no cyanosis, no clubbing, no ulcers  Peripheral : 2+ bilateral upper extremity pulses, 2+ bilateral femoral pulses, 2+ bilateral dorsal pedal pulse Neuro: Alert and oriented. No facial asymmetry. No focal deficit. Moves all extremities spontaneously. Musculoskeletal: Normal muscle tone without kyphosis Psych:  Responds to questions appropriately with a normal affect.    Assessment: 81 year old female with known hypertension mixed hyperlipidemia anemia of chronic kidney issues and chronic disease with chronic kidney disease stage IV bundle branch block with chronic nonvalvular atrial fibrillation with previous history of stroke with elevated troponin and acute shortness of breath multifactorial in nature without evidence of myocardial infarction at this time  Plan: 1. No further cardiac intervention at this time due to stress test in the past being recently normal and no evidence of myocardial infarction at this juncture 2. Continue medication management will atrial fibrillation with controlled ventricular rate for further risk reduction in shortness of breath 4. Holter monitor to assess for 24 hour heart rate control in adjustments as necessary 4. No further intervention of minimal elevation of troponin consistent with demand ischemia 5. Continue diarrhetic care treatment as able based on nephrology consultation due to concerns that the anemia and chronic kidney disease with severe mitral regurgitation and pulmonary hypertension by echocardiogram are causing prominent shortness of breath and PND as well as orthopnea 6. Possible discharge to home with close follow-up as listed above  Signed, Corey Skains M.D. [facility] 09/28/2014, 8:34 AM

## 2014-09-28 NOTE — ED Notes (Signed)
Dr Lovena Le notified of pt's elevated troponin 0.07

## 2014-09-28 NOTE — ED Provider Notes (Signed)
[facility] Emergency Department Provider Note ____________________________________________  Time seen: Approximately 5:06 AM  I have reviewed the triage vital signs and the nursing notes.   HISTORY  Chief Complaint Shortness of Breath  HPI Kerry Walsh is a 81 y.o. female who was actually discharged 24 hours ago from the hospital for shortness of breath and now presents back with worsening shortness of breath at home. Patient was treated in the hospital for congestive heart failure, was sent home on torsemide, but had not gotten the prescription filled at this time. Patient states that she just kind of progressively got more short of breath this morning and decided come in to be evaluated again. She denies his tympanic chest pain, fever, chills, cough or cold symptoms or lower extremity edema or swelling. Nothing seems to make it better or worse except for sitting up course seems to be better than lying flat.   Past Medical History  Diagnosis Date  . Hypertension   . Dysrhythmia     Atrial Fibrillation  . Cancer     Carcinoid Tumor  . Stroke     No residual neurological deficits  . Chronic kidney disease     CKD with last known creatinine fo 2.4 in Dec 2015  . Malignant poorly differentiated neuroendocrine carcinoma 08/12/2014    Patient Active Problem List   Diagnosis Date Noted  . Elevated troponin 09/25/2014  . HTN (hypertension) 09/25/2014  . Acute CHF 09/25/2014  . 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 Dizziness 06/28/2014  . Atrial fibrillation 06/28/2014  . Dizziness and giddiness 06/28/2014  . Osteoporosis, post-menopausal 03/09/2014  . ADH disorder 01/23/2014  . 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 Arteriosclerosis of coronary artery 08/23/2013  . Chronic kidney disease (CKD), stage IV (severe) 08/23/2013  . Adult hypothyroidism 08/23/2013    Past Surgical History  Procedure Laterality Date   . Abdominal hysterectomy    . Cholecystectomy    . Eye surgery    . Joint replacement      Knee replacement surgery- bilateral    Current Outpatient Rx  Name  Route  Sig  Dispense  Refill  . sodium chloride 1 G tablet   Oral   Take 1 g by mouth 3 (three) times daily.         Marland Kitchen amLODipine (NORVASC) 5 MG tablet   Oral   Take 5 mg by mouth daily.         . calcitRIOL (ROCALTROL) 0.25 MCG capsule   Oral   Take 0.25 mcg by mouth daily.          . ciprofloxacin (CIPRO) 500 MG tablet   Oral   Take 500 mg by mouth at bedtime.          . dabigatran (PRADAXA) 75 MG CAPS capsule   Oral   Take 75 mg by mouth 2 (two) times daily.         . enalapril (VASOTEC) 5 MG tablet   Oral   Take 5 mg by mouth daily.          Marland Kitchen levothyroxine (SYNTHROID, LEVOTHROID) 75 MCG tablet   Oral   Take 75 mcg by mouth daily before breakfast.         . metoprolol (LOPRESSOR) 50 MG tablet   Oral   Take 50 mg by mouth daily.         . sodium bicarbonate 650 MG tablet   Oral   Take 650 mg  by mouth 2 (two) times daily.         Marland Kitchen tolterodine (DETROL LA) 4 MG 24 hr capsule   Oral   Take 4 mg by mouth daily.         Marland Kitchen torsemide (DEMADEX) 20 MG tablet   Oral   Take 1 tablet (20 mg total) by mouth daily.   30 tablet   0     Allergies Penicillins  Family History  Problem Relation Age of Onset  . CAD Father     Social History History  Substance Use Topics  . Smoking status: Never Smoker   . Smokeless tobacco: Not on file  . Alcohol Use: No    Review of Systems Constitutional: No fever/chills Eyes: No visual changes. ENT: No sore throat. Cardiovascular: Denies chest pain. Respiratory: Patient with shortness of breath and was recently discharged from hospital admission for congestive heart failure but has not been able to get her prescriptions filled. Gastrointestinal: No abdominal pain.  No nausea, no vomiting.  No diarrhea.  No constipation. Genitourinary:  Negative for dysuria. Musculoskeletal: Negative for back pain. Skin: Negative for rash. Neurological: Negative for headaches, focal weakness or numbness. 10-point ROS otherwise negative.  ____________________________________________   PHYSICAL EXAM:  VITAL SIGNS: ED Triage Vitals  Enc Vitals Group     BP 09/28/14 0425 164/86 mmHg     Pulse Rate 09/28/14 0425 81     Resp 09/28/14 0425 20     Temp 09/28/14 0425 98 F (36.7 C)     Temp Source 09/28/14 0425 Oral     SpO2 09/28/14 0425 93 %     Weight 09/28/14 0425 135 lb (61.236 kg)     Height 09/28/14 0425 5\' 2"  (1.575 m)     Head Cir --      Peak Flow --      Pain Score 09/28/14 0427 0     Pain Loc --      Pain Edu? --      Excl. in [place]? --     Constitutional: Alert and oriented. Well appearing and in mild distress secondary to her shortness of breath. Eyes: Conjunctivae are normal. PERRL. EOMI. Head: Atraumatic. Nose: No congestion/rhinnorhea. Mouth/Throat: Mucous membranes are moist.  Oropharynx non-erythematous. Neck: No stridor.   Cardiovascular: Normal rate, regular rhythm. Grossly normal heart sounds.  Good peripheral circulation. Respiratory: Mild tachypnea and slightly increased respiratory effort.  No retractions. Lungs CTAB. There is no significant scalp using all rhonchi in the longer we picked up. Gastrointestinal: Soft and nontender. No distention. No abdominal bruits. No CVA tenderness. Musculoskeletal: No lower extremity tenderness nor edema.  No joint effusions. Neurologic:  Normal speech and language. No gross focal neurologic deficits are appreciated. No gait instability. No edema to legs. Skin:  Skin is warm, dry and intact. No rash noted. Psychiatric: Mood and affect are normal. Speech and behavior are normal.  ____________________________________________   LABS (all labs ordered are listed, but only abnormal results are displayed)  Labs Reviewed  CBC WITH DIFFERENTIAL/PLATELET - Abnormal; Notable  for the following:    RBC 2.86 (*)    Hemoglobin 8.6 (*)    HCT 25.0 (*)    All other components within normal limits  COMPREHENSIVE METABOLIC PANEL - Abnormal; Notable for the following:    Glucose, Bld 122 (*)    BUN 54 (*)    Creatinine, Ser 3.22 (*)    Calcium 8.2 (*)    Total Protein 6.1 (*)  Total Bilirubin 0.1 (*)    GFR calc non Af Amer 12 (*)    GFR calc Af Amer 14 (*)    All other components within normal limits  TROPONIN I - Abnormal; Notable for the following:    Troponin I 0.07 (*)    All other components within normal limits  BRAIN NATRIURETIC PEPTIDE - Abnormal; Notable for the following:    B Natriuretic Peptide 589.0 (*)    All other components within normal limits  BLOOD GAS, ARTERIAL   ____________________________________________  EKG ED ECG REPORT I, Ruby Cola, the attending physician, personally viewed and interpreted this ECG.   Date: 09/28/2014  EKG Time: 0424  Rate: 81  Rhythm: normal EKG, normal sinus rhythm, unchanged from previous tracings, normal sinus rhythm, PAC's noted  Axis: Left axis deviation  Intervals:left bundle branch block  ST&T Change: Nonspecific ST and T-wave changes  ____________________________________________  RADIOLOGY Dg Chest 2 View  09/28/2014   CLINICAL DATA:  Acute onset of shortness of breath. Initial encounter.  EXAM: CHEST  2 VIEW  COMPARISON:  Chest radiograph performed 09/25/2014  FINDINGS: The lungs are well-aerated. Vascular congestion is noted, with mildly increased interstitial markings, likely reflecting minimal interstitial edema. Small bilateral pleural effusions are seen. No pneumothorax is seen.  The heart is borderline normal in size. No acute osseous abnormalities are seen. The patient's left shoulder arthroplasty is incompletely imaged but appears grossly unremarkable.  IMPRESSION: Vascular congestion, with mildly increased interstitial markings, likely reflecting minimal interstitial edema. Small  bilateral pleural effusions seen.   Electronically Signed   By: Garald Balding M.D.   On: 09/28/2014 05:52   Dg Chest 2 View  09/25/2014   CLINICAL DATA:  Shortness of breath with weakness for 4 days. History of atrial fibrillation, stroke and cancer. Initial encounter.  EXAM: CHEST  2 VIEW  COMPARISON:  06/28/2014 and 01/03/2014.  FINDINGS: The heart size and mediastinal contours are stable with mild aortic atherosclerosis. There is new vascular congestion with interstitial pulmonary edema, bibasilar atelectasis and probable small bilateral pleural effusions. There is no confluent airspace opacity. Patient is status post left shoulder arthroplasty and lumbar fusion. No acute osseous findings evident.  IMPRESSION: Interstitial pulmonary edema with small pleural effusions consistent with congestive heart failure.   Electronically Signed   By: Richardean Sale M.D.   On: 09/25/2014 20:21    ____________________________________________   PROCEDURES  Procedure(s) performed: None  Critical Care performed: No  ____________________________________________   INITIAL IMPRESSION / ASSESSMENT AND PLAN / ED COURSE  Pertinent labs & imaging results that were available during my care of the patient were reviewed by me and considered in my medical decision making (see chart for details).  ----------------------------------------- 5:13 AM on 09/28/2014 -----------------------------------------  Patient will get chest x-ray, ABG, and labs prior to deciding what treatment to do for patient. ____________________________________________  ----------------------------------------- 6:36 AM on 09/28/2014 ----------------------------------------- Patient with [place]chel [place] congestion and bilateral pleural effusions. Patient is going to be given some IV Lasix and the hospitalist will be called for admission.  ----------------------------------------- 7:13 AM on  09/28/2014 -----------------------------------------  I discussed this patient with Dr. Bobbye Charleston the hospitalist service to give her an additional dose of Lasix 40 mg secondary to her creatinine being elevated and he is going to see the patient.  FINAL CLINICAL IMPRESSION(S) / ED DIAGNOSES  Final diagnoses:  Shortness of breath  Congestive heart failure, unspecified congestive heart failure chronicity, unspecified congestive heart failure type  Ruby Cola, MD 09/28/14 [id number]

## 2014-09-28 NOTE — Consult Note (Signed)
[facility] NAME: Kerry Walsh    MR#:  [id number]  DATE OF BIRTH:  [birth year]  DATE OF ADMISSION:  09/28/2014  PRIMARY CARE PHYSICIAN: Kirk Ruths., MD   REQUESTING/REFERRING PHYSICIAN: Dr. Lovena Le  CHIEF COMPLAINT:   Chief Complaint  Patient presents with  . Shortness of Breath    HISTORY OF PRESENT ILLNESS:  Kerry Walsh  is a 81 y.o. female with a known history of CHF with cardiomyopathy with severe mitral regurgitation, chronic kidney disease stage IV, anemia, atrial fibrillation on anticoagulation. Patient was recently in the hospital and treated for congestive heart failure. She presented back to the emergency room with shortness of breath that woke her up from sleep. I was called to see her before any Lasix was given in the emergency room and she felt her breathing was fine at that point in time. No chest pain, no cough.  PAST MEDICAL HISTORY:   Past Medical History  Diagnosis Date  . Hypertension   . Dysrhythmia     Atrial Fibrillation  . Cancer     Carcinoid Tumor  . Stroke     No residual neurological deficits  . Chronic kidney disease     CKD with last known creatinine fo 2.4 in Dec 2015  . 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 CHF (congestive heart failure)     PAST SURGICAL HISTOIRY:   Past Surgical History  Procedure Laterality Date  . Abdominal hysterectomy    . Eye surgery    . Joint replacement      Knee replacement surgery- bilateral  . Shoulder surgery Bilateral     SOCIAL HISTORY:   History  Substance Use Topics  . Smoking status: Never Smoker   . Smokeless tobacco: Not on file  . Alcohol Use: No    FAMILY HISTORY:   Family History  Problem Relation Age of Onset  . CAD Father   . Stroke Father   . Kidney failure Mother     DRUG ALLERGIES:   Allergies  Allergen Reactions  . Penicillins Rash    REVIEW OF SYSTEMS:   CONSTITUTIONAL: No fever, positive for fatigue.  EYES: No blurred or double vision. Wears glasses. EARS, NOSE, AND THROAT: No tinnitus or ear pain.  RESPIRATORY: No cough, positive for shortness of breath at night, no wheezing or hemoptysis.  CARDIOVASCULAR: No chest pain, orthopnea, edema.  GASTROINTESTINAL: No nausea, vomiting, diarrhea or abdominal pain.  GENITOURINARY: No dysuria, hematuria.  ENDOCRINE: No polyuria, nocturia,  HEMATOLOGY: History of anemia, no easy bruising or bleeding SKIN: No rash or lesion. MUSCULOSKELETAL: No joint pain or arthritis.   NEUROLOGIC: No tingling, numbness, weakness.  PSYCHIATRY: No anxiety or depression.   MEDICATIONS AT HOME:   Prior to Admission medications   Medication Sig Start Date End Date Taking? Authorizing Provider  sodium chloride 1 G tablet Take 1 g by mouth 3 (three) times daily.   Yes Historical Provider, MD  amLODipine (NORVASC) 5 MG tablet Take 5 mg by mouth daily.    Historical Provider, MD  calcitRIOL (ROCALTROL) 0.25 MCG capsule Take 0.25 mcg by mouth daily.     Historical Provider, MD  ciprofloxacin (CIPRO) 500 MG tablet Take 500 mg by mouth at bedtime.     Historical Provider, MD  dabigatran (PRADAXA) 75 MG CAPS capsule Take 75 mg by mouth 2 (two) times daily.    Historical Provider, MD  enalapril (VASOTEC) 5 MG tablet Take 5  mg by mouth daily.     Historical Provider, MD  levothyroxine (SYNTHROID, LEVOTHROID) 75 MCG tablet Take 75 mcg by mouth daily before breakfast.    Historical Provider, MD  metoprolol (LOPRESSOR) 50 MG tablet Take 1 tablet (50 mg total) by mouth 2 (two) times daily. 09/28/14   Loletha Grayer, MD  sodium bicarbonate 650 MG tablet Take 650 mg by mouth 2 (two) times daily.    Historical Provider, MD  tolterodine (DETROL LA) 4 MG 24 hr capsule Take 4 mg by mouth daily.    Historical Provider, MD  torsemide (DEMADEX) 20 MG tablet Take 1 tablet (20 mg total) by mouth daily. 09/26/14   Loletha Grayer, MD       VITAL SIGNS:  Blood pressure 137/72, pulse 70, temperature 98 F (36.7 C), temperature source Oral, resp. rate 20, height 5\' 2"  (1.575 m), weight 61.236 kg (135 lb), SpO2 93 %.  PHYSICAL EXAMINATION:  GENERAL:  81 y.o.-year-old patient lying in the bed with no acute distress.  EYES: Pupils equal, round, reactive to light and accommodation. No scleral icterus. Extraocular muscles intact.  HEENT: Head atraumatic, normocephalic. Oropharynx and nasopharynx clear.  NECK:  Supple, no jugular venous distention. No thyroid enlargement, no tenderness.  LUNGS: Normal breath sounds bilaterally, no wheezing, slight bibasilar rales rales prior to IV Lasix and no rales after IV Lasix.  No use of accessory muscles of respiration.  CARDIOVASCULAR: S1, S2 irregularly regular rate controlled. 3 and a 6 systolic murmur, no rubs, or gallops.  ABDOMEN: Soft, nontender, nondistended. Bowel sounds present. No organomegaly or mass.  EXTREMITIES: No pedal edema, cyanosis, or clubbing.  NEUROLOGIC: Cranial nerves II through XII are intact. Muscle strength 5/5 in all extremities. Sensation intact. Gait not checked.  PSYCHIATRIC: The patient is alert and oriented x 3.  SKIN: No obvious rash, lesion, or ulcer.   LABORATORY PANEL:   CBC  Recent Labs Lab 09/28/14 0445  WBC 4.5  HGB 8.6*  HCT 25.0*  PLT 172    Chemistries   Recent Labs Lab 09/28/14 0445  NA 139  K 4.5  CL 108  CO2 22  GLUCOSE 122*  BUN 54*  CREATININE 3.22*  CALCIUM 8.2*  AST 22  ALT 18  ALKPHOS 62  BILITOT 0.1*    Cardiac Enzymes  Recent Labs Lab 09/28/14 0445  TROPONINI 0.07*    RADIOLOGY:  Dg Chest 2 View  09/28/2014   CLINICAL DATA:  Acute onset of shortness of breath. Initial encounter.  EXAM: CHEST  2 VIEW  COMPARISON:  Chest radiograph performed 09/25/2014  FINDINGS: The lungs are well-aerated. Vascular congestion is noted, with mildly increased interstitial markings, likely reflecting minimal interstitial edema.  Small bilateral pleural effusions are seen. No pneumothorax is seen.  The heart is borderline normal in size. No acute osseous abnormalities are seen. The patient's left shoulder arthroplasty is incompletely imaged but appears grossly unremarkable.  IMPRESSION: Vascular congestion, with mildly increased interstitial markings, likely reflecting minimal interstitial edema. Small bilateral pleural effusions seen.   Electronically Signed   By: Garald Balding M.D.   On: 09/28/2014 05:52    EKG:   Left bundle-branch block unchanged  IMPRESSION AND PLAN:   1. Paroxysmal nocturnal dyspnea. History of congestive heart failure with cardiomyopathy and severe mitral regurgitation. I saw the patient before 80 mg of IV Lasix was given. I had Dr. Nehemiah Massed [facility] cardiology see the patient in the emergency room. He felt that this patient can be worked up as outpatient  since the patient was just recently here in the hospital. They will set up a follow-up appointment with cardiology tomorrow. I will increase metoprolol to 50 mg twice a day. I will hold enalapril for right now since creatinine is slightly higher than previous. Can continue torsemide 20 mg daily starting tomorrow. Likely will be a tough fluid balance with chronic kidney disease and severe mitral regurgitation and atrial fibrillation. 2. Chronic kidney disease stage 4- hold enalapril. Follow-up with nephrologist as outpatient. 3. Atrial fibrillation rate controlled and anticoagulated with Pradaxa. Can consider stopping Pradaxa with anemia. 4. Anemia likely of chronic disease follow-up as outpatient. 5. Hypothyroidism continue levothyroxin.    All the records are reviewed and case discussed with Consulting provider. Management plans discussed with the patient, and she is in agreement.  CODE STATUS: Full code  TOTAL TIME TAKING CARE OF THIS PATIENT: 65 minutes.    Loletha Grayer M.D on 09/28/2014 at 9:09 AM  Between 7am to 6pm - Pager -  [phone]  After 6pm go to [email or web address] - password [email or web address] [facility]  Office  [phone]  CC: Primary care Physician: Kirk Ruths., MD

## 2014-09-28 NOTE — ED Notes (Signed)
MD at bedside. 

## 2014-10-01 DIAGNOSIS — I5022 Chronic systolic (congestive) heart failure: Secondary | ICD-10-CM | POA: Insufficient documentation

## 2014-10-07 ENCOUNTER — Inpatient Hospital Stay: Payer: Medicare Other | Attending: Oncology

## 2014-11-02 ENCOUNTER — Emergency Department
Admission: EM | Admit: 2014-11-02 | Discharge: 2014-11-02 | Disposition: A | Discharge status: Home / Self Care | Payer: Medicare Other | Attending: Emergency Medicine | Admitting: Emergency Medicine

## 2014-11-02 ENCOUNTER — Emergency Department: Payer: Medicare Other

## 2014-11-02 ENCOUNTER — Encounter: Payer: Self-pay | Admitting: Emergency Medicine

## 2014-11-02 DIAGNOSIS — I129 Hypertensive chronic kidney disease with stage 1 through stage 4 chronic kidney disease, or unspecified chronic kidney disease: Secondary | ICD-10-CM | POA: Insufficient documentation

## 2014-11-02 DIAGNOSIS — M546 Pain in thoracic spine: Secondary | ICD-10-CM | POA: Insufficient documentation

## 2014-11-02 DIAGNOSIS — M549 Dorsalgia, unspecified: Secondary | ICD-10-CM

## 2014-11-02 DIAGNOSIS — N184 Chronic kidney disease, stage 4 (severe): Secondary | ICD-10-CM | POA: Insufficient documentation

## 2014-11-02 DIAGNOSIS — Z79899 Other long term (current) drug therapy: Secondary | ICD-10-CM | POA: Diagnosis not present

## 2014-11-02 DIAGNOSIS — Z880 Allergy status to penicillin: Secondary | ICD-10-CM | POA: Insufficient documentation

## 2014-11-02 LAB — CBC WITH DIFFERENTIAL/PLATELET
BASOS PCT: 2 %
Basophils Absolute: 0.1 10*3/uL (ref 0–0.1)
EOS ABS: 0.1 10*3/uL (ref 0–0.7)
EOS PCT: 2 %
HCT: 28.2 % — ABNORMAL LOW (ref 35.0–47.0)
Hemoglobin: 9.5 g/dL — ABNORMAL LOW (ref 12.0–16.0)
Lymphocytes Relative: 25 %
Lymphs Abs: 1.1 10*3/uL (ref 1.0–3.6)
MCH: 29.6 pg (ref 26.0–34.0)
MCHC: 33.7 g/dL (ref 32.0–36.0)
MCV: 87.9 fL (ref 80.0–100.0)
Monocytes Absolute: 0.4 10*3/uL (ref 0.2–0.9)
Monocytes Relative: 8 %
Neutro Abs: 2.8 10*3/uL (ref 1.4–6.5)
Neutrophils Relative %: 63 %
PLATELETS: 161 10*3/uL (ref 150–440)
RBC: 3.21 MIL/uL — AB (ref 3.80–5.20)
RDW: 14.2 % (ref 11.5–14.5)
WBC: 4.4 10*3/uL (ref 3.6–11.0)

## 2014-11-02 LAB — BASIC METABOLIC PANEL
ANION GAP: 7 (ref 5–15)
BUN: 43 mg/dL — ABNORMAL HIGH (ref 6–20)
CALCIUM: 8.7 mg/dL — AB (ref 8.9–10.3)
CO2: 21 mmol/L — ABNORMAL LOW (ref 22–32)
Chloride: 106 mmol/L (ref 101–111)
Creatinine, Ser: 2.65 mg/dL — ABNORMAL HIGH (ref 0.44–1.00)
GFR calc Af Amer: 18 mL/min — ABNORMAL LOW (ref >60)
GFR, EST NON AFRICAN AMERICAN: 15 mL/min — AB (ref >60)
GLUCOSE: 99 mg/dL (ref 65–99)
Potassium: 4.5 mmol/L (ref 3.5–5.1)
Sodium: 134 mmol/L — ABNORMAL LOW (ref 135–145)

## 2014-11-02 LAB — TROPONIN I: TROPONIN I: 0.07 ng/mL — AB (ref <0.031)

## 2014-11-02 NOTE — ED Notes (Signed)
Reports upper back pain onset last night

## 2014-11-02 NOTE — Discharge Instructions (Signed)
Please seek medical attention for any high fevers, chest pain, shortness of breath, change in behavior, persistent vomiting, bloody stool or any other new or concerning symptoms. ° ° °Back Pain, Adult °Low back pain is very common. About 1 in 5 people have back pain. The cause of low back pain is rarely dangerous. The pain often gets better over time. About half of people with a sudden onset of back pain feel better in just 2 weeks. About 8 in 10 people feel better by 6 weeks.  °CAUSES °Some common causes of back pain include: °· Strain of the muscles or ligaments supporting the spine. °· Wear and tear (degeneration) of the spinal discs. °· Arthritis. °· Direct injury to the back. °DIAGNOSIS °Most of the time, the direct cause of low back pain is not known. However, back pain can be treated effectively even when the exact cause of the pain is unknown. Answering your caregiver's questions about your overall health and symptoms is one of the most accurate ways to make sure the cause of your pain is not dangerous. If your caregiver needs more information, he or she may order lab work or imaging tests (X-rays or MRIs). However, even if imaging tests show changes in your back, this usually does not require surgery. °HOME CARE INSTRUCTIONS °For many people, back pain returns. Since low back pain is rarely dangerous, it is often a condition that people can learn to manage on their own.  °· Remain active. It is stressful on the back to sit or stand in one place. Do not sit, drive, or stand in one place for more than 30 minutes at a time. Take short walks on level surfaces as soon as pain allows. Try to increase the length of time you walk each day. °· Do not stay in bed. Resting more than 1 or 2 days can delay your recovery. °· Do not avoid exercise or work. Your body is made to move. It is not dangerous to be active, even though your back may hurt. Your back will likely heal faster if you return to being active before your  pain is gone. °· Pay attention to your body when you  bend and lift. Many people have less discomfort when lifting if they bend their knees, keep the load close to their bodies, and avoid twisting. Often, the most comfortable positions are those that put less stress on your recovering back. °· Find a comfortable position to sleep. Use a firm mattress and lie on your side with your knees slightly bent. If you lie on your back, put a pillow under your knees. °· Only take over-the-counter or prescription medicines as directed by your caregiver. Over-the-counter medicines to reduce pain and inflammation are often the most helpful. Your caregiver may prescribe muscle relaxant drugs. These medicines help dull your pain so you can more quickly return to your normal activities and healthy exercise. °· Put ice on the injured area. °¨ Put ice in a plastic bag. °¨ Place a towel between your skin and the bag. °¨ Leave the ice on for 15-20 minutes, 03-04 times a day for the first 2 to 3 days. After that, ice and heat may be alternated to reduce pain and spasms. °· Ask your caregiver about trying back exercises and gentle massage. This may be of some benefit. °· Avoid feeling anxious or stressed. Stress increases muscle tension and can worsen back pain. It is important to recognize when you are anxious or stressed and learn ways to manage it. Exercise is a great option. °SEEK MEDICAL CARE IF: °· You have pain that is not   relieved with rest or medicine. °· You have pain that does not improve in 1 week. °· You have new symptoms. °· You are generally not feeling well. °SEEK IMMEDIATE MEDICAL CARE IF:  °· You have pain that radiates from your back into your legs. °· You develop new bowel or bladder control problems. °· You have unusual weakness or numbness in your arms or legs. °· You develop nausea or vomiting. °· You develop abdominal pain. °· You feel faint. °Document Released: 02/06/2005 Document Revised: 08/08/2011 Document  Reviewed: 06/10/2013 °ExitCare® Patient Information ©2015 [employer]. This information is not intended to replace advice given to you by your health care provider. Make sure you discuss any questions you have with your health care provider. ° °

## 2014-11-02 NOTE — ED Provider Notes (Signed)
[facility] Emergency Department Provider Note    ____________________________________________  Time seen: 1355  I have reviewed the triage vital signs and the nursing notes.   HISTORY  Chief Complaint Back Pain   History limited by: Not Limited   HPI Kerry Walsh is a 81 y.o. female who presents to the emergency department today with back pain. She states the back pain is been worse over the past one day. She states that it hurts in her mid back. She denies any trauma to her back. Denies any recent falls or motor vehicle accident. She states she has had similar pain in the past and is been evaluated in the past. She denies any chest pain or shortness of breath. She denies any numbness or tingling in her arms or legs. Denies any recent fevers.     Past Medical History  Diagnosis Date  . Hypertension   . Dysrhythmia     Atrial Fibrillation  . Cancer     Carcinoid Tumor  . Stroke     No residual neurological deficits  . Chronic kidney disease     CKD with last known creatinine fo 2.4 in Dec 2015  . 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 CHF (congestive heart failure)     Patient Active Problem List   Diagnosis Date Noted  . Elevated troponin 09/25/2014  . HTN (hypertension) 09/25/2014  . Acute CHF 09/25/2014  . 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 Dizziness 06/28/2014  . Atrial fibrillation 06/28/2014  . Dizziness and giddiness 06/28/2014  . Osteoporosis, post-menopausal 03/09/2014  . ADH disorder 01/23/2014  . 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 Arteriosclerosis of coronary artery 08/23/2013  . Chronic kidney disease (CKD), stage IV (severe) 08/23/2013  . Adult hypothyroidism 08/23/2013    Past Surgical History  Procedure Laterality Date  . Abdominal hysterectomy    . Eye surgery    . Joint replacement      Knee replacement surgery- bilateral  . Shoulder  surgery Bilateral     Current Outpatient Rx  Name  Route  Sig  Dispense  Refill  . amLODipine (NORVASC) 5 MG tablet   Oral   Take 5 mg by mouth daily.         . calcitRIOL (ROCALTROL) 0.25 MCG capsule   Oral   Take 0.25 mcg by mouth daily.          . ciprofloxacin (CIPRO) 500 MG tablet   Oral   Take 500 mg by mouth at bedtime.          . dabigatran (PRADAXA) 75 MG CAPS capsule   Oral   Take 75 mg by mouth 2 (two) times daily.         Marland Kitchen levothyroxine (SYNTHROID, LEVOTHROID) 75 MCG tablet   Oral   Take 75 mcg by mouth daily before breakfast.         . metoprolol (LOPRESSOR) 50 MG tablet   Oral   Take 1 tablet (50 mg total) by mouth 2 (two) times daily.   60 tablet   0   . sodium bicarbonate 650 MG tablet   Oral   Take 650 mg by mouth 2 (two) times daily.         Marland Kitchen tolterodine (DETROL LA) 4 MG 24 hr capsule   Oral   Take 4 mg by mouth daily.         Marland Kitchen torsemide (DEMADEX) 20 MG tablet   Oral  Take 1 tablet (20 mg total) by mouth daily.   30 tablet   0     Allergies Penicillins  Family History  Problem Relation Age of Onset  . CAD Father   . Stroke Father   . Kidney failure Mother     Social History Social History  Substance Use Topics  . Smoking status: Never Smoker   . Smokeless tobacco: None  . Alcohol Use: No    Review of Systems  Constitutional: Negative for fever. Cardiovascular: Negative for chest pain. Respiratory: Negative for shortness of breath. Gastrointestinal: Negative for abdominal pain, vomiting and diarrhea. Genitourinary: Negative for dysuria. Musculoskeletal: Positive for back pain Skin: Negative for rash. Neurological: Negative for headaches, focal weakness or numbness.  10-point ROS otherwise negative.  ____________________________________________   PHYSICAL EXAM:  VITAL SIGNS: ED Triage Vitals  Enc Vitals Group     BP 11/02/14 1319 160/120 mmHg     Pulse Rate 11/02/14 1319 94     Resp 11/02/14  1319 20     Temp 11/02/14 1319 98.6 F (37 C)     Temp Source 11/02/14 1319 Oral     SpO2 11/02/14 1319 96 %     Weight 11/02/14 1319 135 lb (61.236 kg)     Height 11/02/14 1319 5\' 2"  (1.575 m)     Head Cir --      Peak Flow --      Pain Score 11/02/14 1320 5   Constitutional: Alert and oriented. Well appearing and in no distress. Eyes: Conjunctivae are normal. PERRL. Normal extraocular movements. ENT   Head: Normocephalic and atraumatic.   Nose: No congestion/rhinnorhea.   Mouth/Throat: Mucous membranes are moist.   Neck: No stridor. Hematological/Lymphatic/Immunilogical: No cervical lymphadenopathy. Cardiovascular: Normal rate, regular rhythm.  No murmurs, rubs, or gallops. Respiratory: Normal respiratory effort without tachypnea nor retractions. Breath sounds are clear and equal bilaterally. No wheezes/rales/rhonchi. Gastrointestinal: Soft and nontender. No distention. Genitourinary: Deferred Musculoskeletal: Normal range of motion in all extremities. Tender to palpation over the thoracic spine. No gross deformities. Neurologic:  Normal speech and language. No gross focal neurologic deficits are appreciated. Speech is normal.  Skin:  Skin is warm, dry and intact. No rash noted. Psychiatric: Mood and affect are normal. Speech and behavior are normal. Patient exhibits appropriate insight and judgment.  ____________________________________________    LABS (pertinent positives/negatives)  Labs Reviewed  CBC WITH DIFFERENTIAL/PLATELET - Abnormal; Notable for the following:    RBC 3.21 (*)    Hemoglobin 9.5 (*)    HCT 28.2 (*)    All other components within normal limits  TROPONIN I - Abnormal; Notable for the following:    Troponin I 0.07 (*)    All other components within normal limits  BASIC METABOLIC PANEL - Abnormal; Notable for the following:    Sodium 134 (*)    CO2 21 (*)    BUN 43 (*)    Creatinine, Ser 2.65 (*)    Calcium 8.7 (*)    GFR calc non Af  Amer 15 (*)    GFR calc Af Amer 18 (*)    All other components within normal limits     ____________________________________________   EKG  I, Nance Pear, attending physician, personally viewed and interpreted this EKG  EKG Time: 1331 Rate: 92 Rhythm: atrial fibrillation Axis: left axis deviation Intervals: qtc 497 QRS: LBBB ST changes: no st elevation equivalent Impression: LBBB, abnormal ekg   ____________________________________________    RADIOLOGY  Thoracic spine IMPRESSION: Thoracic  spine scoliosis, approximately 16 degrees, and kyphosis.  Mild degenerative change.  No acute findings.  ____________________________________________   PROCEDURES  Procedure(s) performed: None  Critical Care performed: No  ____________________________________________   INITIAL IMPRESSION / ASSESSMENT AND PLAN / ED COURSE  Pertinent labs & imaging results that were available during my care of the patient were reviewed by me and considered in my medical decision making (see chart for details).  Patient presented to the emergency department today with concerns for back pain. On exam she is tender to the thoracic back. X-ray of the thoracic spine did not show any acute fractures. At this point the patient safe for discharge. I doubt dissection, patient is not in extremis, no no focal neuro deficits.  ____________________________________________   FINAL CLINICAL IMPRESSION(S) / ED DIAGNOSES  Final diagnoses:  Upper back pain     Nance Pear, MD 11/02/14 1700

## 2014-11-02 NOTE — ED Notes (Signed)
Patient resting in stretcher. Respirations even and unlabored. No obvious distress. Cardiac monitor in place. No needs/concerns verbalized at this time. Call bell within reach. Encouraged to call with needs. Will continue to monitor. 

## 2014-11-04 ENCOUNTER — Inpatient Hospital Stay: Payer: Medicare Other | Attending: Oncology

## 2014-11-04 ENCOUNTER — Encounter: Payer: Self-pay | Admitting: Oncology

## 2014-11-04 ENCOUNTER — Inpatient Hospital Stay (HOSPITAL_BASED_OUTPATIENT_CLINIC_OR_DEPARTMENT_OTHER): Payer: Medicare Other | Admitting: Oncology

## 2014-11-04 ENCOUNTER — Inpatient Hospital Stay: Payer: Medicare Other

## 2014-11-04 VITALS — BP 172/92 | HR 93 | Temp 97.9°F | Wt 133.6 lb

## 2014-11-04 DIAGNOSIS — Z8673 Personal history of transient ischemic attack (TIA), and cerebral infarction without residual deficits: Secondary | ICD-10-CM | POA: Insufficient documentation

## 2014-11-04 DIAGNOSIS — N189 Chronic kidney disease, unspecified: Secondary | ICD-10-CM

## 2014-11-04 DIAGNOSIS — I129 Hypertensive chronic kidney disease with stage 1 through stage 4 chronic kidney disease, or unspecified chronic kidney disease: Secondary | ICD-10-CM | POA: Diagnosis not present

## 2014-11-04 DIAGNOSIS — M549 Dorsalgia, unspecified: Secondary | ICD-10-CM | POA: Insufficient documentation

## 2014-11-04 DIAGNOSIS — R944 Abnormal results of kidney function studies: Secondary | ICD-10-CM | POA: Insufficient documentation

## 2014-11-04 DIAGNOSIS — D649 Anemia, unspecified: Secondary | ICD-10-CM

## 2014-11-04 DIAGNOSIS — I4891 Unspecified atrial fibrillation: Secondary | ICD-10-CM | POA: Diagnosis not present

## 2014-11-04 DIAGNOSIS — M419 Scoliosis, unspecified: Secondary | ICD-10-CM

## 2014-11-04 DIAGNOSIS — Z79899 Other long term (current) drug therapy: Secondary | ICD-10-CM | POA: Diagnosis not present

## 2014-11-04 DIAGNOSIS — C786 Secondary malignant neoplasm of retroperitoneum and peritoneum: Secondary | ICD-10-CM

## 2014-11-04 DIAGNOSIS — C569 Malignant neoplasm of unspecified ovary: Secondary | ICD-10-CM | POA: Insufficient documentation

## 2014-11-04 DIAGNOSIS — C7A1 Malignant poorly differentiated neuroendocrine tumors: Secondary | ICD-10-CM

## 2014-11-04 DIAGNOSIS — C7A8 Other malignant neuroendocrine tumors: Secondary | ICD-10-CM

## 2014-11-04 LAB — CBC WITH DIFFERENTIAL/PLATELET
Basophils Absolute: 0.1 10*3/uL (ref 0–0.1)
Basophils Relative: 2 %
Eosinophils Absolute: 0.1 10*3/uL (ref 0–0.7)
Eosinophils Relative: 2 %
HEMATOCRIT: 28.2 % — AB (ref 35.0–47.0)
HEMOGLOBIN: 9.3 g/dL — AB (ref 12.0–16.0)
LYMPHS ABS: 1 10*3/uL (ref 1.0–3.6)
Lymphocytes Relative: 24 %
MCH: 29.1 pg (ref 26.0–34.0)
MCHC: 33.1 g/dL (ref 32.0–36.0)
MCV: 87.8 fL (ref 80.0–100.0)
Monocytes Absolute: 0.3 10*3/uL (ref 0.2–0.9)
Monocytes Relative: 6 %
NEUTROS ABS: 2.8 10*3/uL (ref 1.4–6.5)
NEUTROS PCT: 66 %
Platelets: 154 10*3/uL (ref 150–440)
RBC: 3.21 MIL/uL — ABNORMAL LOW (ref 3.80–5.20)
RDW: 14.7 % — ABNORMAL HIGH (ref 11.5–14.5)
WBC: 4.2 10*3/uL (ref 3.6–11.0)

## 2014-11-04 LAB — COMPREHENSIVE METABOLIC PANEL
ALK PHOS: 80 U/L (ref 38–126)
ALT: 16 U/L (ref 14–54)
ANION GAP: 10 (ref 5–15)
AST: 28 U/L (ref 15–41)
Albumin: 3.9 g/dL (ref 3.5–5.0)
BUN: 45 mg/dL — ABNORMAL HIGH (ref 6–20)
CO2: 18 mmol/L — ABNORMAL LOW (ref 22–32)
CREATININE: 2.83 mg/dL — AB (ref 0.44–1.00)
Calcium: 8.8 mg/dL — ABNORMAL LOW (ref 8.9–10.3)
Chloride: 106 mmol/L (ref 101–111)
GFR calc non Af Amer: 14 mL/min — ABNORMAL LOW (ref >60)
GFR, EST AFRICAN AMERICAN: 16 mL/min — AB (ref >60)
Glucose, Bld: 140 mg/dL — ABNORMAL HIGH (ref 65–99)
Potassium: 4.4 mmol/L (ref 3.5–5.1)
Sodium: 134 mmol/L — ABNORMAL LOW (ref 135–145)
TOTAL PROTEIN: 6.5 g/dL (ref 6.5–8.1)
Total Bilirubin: 0.7 mg/dL (ref 0.3–1.2)

## 2014-11-04 LAB — FERRITIN: Ferritin: 66 ng/mL (ref 11–307)

## 2014-11-04 LAB — IRON AND TIBC
IRON: 71 ug/dL (ref 28–170)
Saturation Ratios: 19 % (ref 10.4–31.8)
TIBC: 373 ug/dL (ref 250–450)
UIBC: 302 ug/dL

## 2014-11-04 MED ORDER — HYDROCODONE-ACETAMINOPHEN 5-325 MG PO TABS: 1.0000 | ORAL_TABLET | Freq: Four times a day (QID) | ORAL | Status: DC | PRN

## 2014-11-04 MED ORDER — OCTREOTIDE ACETATE 30 MG IM KIT
30.0000 mg | PACK | Freq: Once | INTRAMUSCULAR | Status: AC
  Administered 2014-11-04: 30 mg via INTRAMUSCULAR
  Filled 2014-11-04: qty 1

## 2014-11-04 NOTE — Progress Notes (Signed)
Patient is in visiable pain, reporting mid back pain 10/10  Currently takes tylenol, is not giving relief

## 2014-11-04 NOTE — Progress Notes (Signed)
[place] @ [facility] [phone]) [phone]  Fax:(336) [place]: 1925/05/25  MR#: [id number]  VOZ#:[id number]  Patient Care Team: Kirk Ruths, MD as PCP - General (Internal Medicine)  CHIEF COMPLAINT:  No chief complaint on file.   ovarian cystic mass underwent robotic bilateral salpingo-oophorectomy in April of 2011 Neuroendocrine tumor, been differentiated carcinoid 2.Ocereoride scan revealed mets  to retro peroneal lymph node Patient underwent lymph node dissection in July of 2011 3.  Patient has been started on Sandostatin LAR from September of 2012 because of symptoms of carcinoid syndrome  Oncology Flowsheet 07/15/2014 08/12/2014  octreotide (SANDOSTATIN LAR) IM 20 mg 30 mg    INTERVAL HISTORY:  81 year old lady recently moved to [place]. since that time she is on Sandostatin her diarrhea is much improved so as abdominal pain.Patient is getting Sandostatin LAR monthly.  patient's general condition is improving.  No abdominal pain.  No nausea and vomiting.  No diarrhea.  Appetidiet has improved Patient does not have any abdominal pain.  No nausea.  No vomiting.  No diarrhea. September's 14, 2016 Patient came today further follow-up regarding metastases.  Neuroendocrine tumor Started having mid back pain and was seen by primary care physician.  Plain x-rays revealed kyphoscoliosis.  Pain started on Saturday night constant dull aching.  Patient has a kyphoscoliosis.  Reviewing x-ray there is no evidence of infection fracture.  Pain is not radiating patient is taking Tylenol without much help.  Patient cannot take nonsteroidal medication because of abnormal serum creatinine REVIEW OF SYSTEMS:   GENERAL:  Feels good.  Active.  No fevers, sweats or weight loss. PERFORMANCE STATUS (ECOG): 01 HEENT:  No visual changes, runny nose, sore throat, mouth sores or tenderness. Lungs: No shortness of breath or cough.  No hemoptysis. Cardiac:  No chest  pain, palpitations, orthopnea, or PND. GI:  No nausea, vomiting, diarrhea, constipation, melena or hematochezia. GU:  No urgency, frequency, dysuria, or hematuria. Musculoskeletal: Mid back pain constant dull aching Extremities:  No pain or swelling. Skin:  No rashes or skin changes. Neuro:  No headache, numbness or weakness, balance or coordination issues. Endocrine:  No diabetes, thyroid issues, hot flashes or night sweats. Psych:  No mood changes, depression or anxiety. Pain:  No focal pain. Review of systems:  All other systems reviewed and found to be negative. As per HPI. Otherwise, a complete review of systems is negatve.  PAST MEDICAL HISTORY: Past Medical History  Diagnosis Date  . Hypertension   . Dysrhythmia     Atrial Fibrillation  . Cancer     Carcinoid Tumor  . Stroke     No residual neurological deficits  . Chronic kidney disease     CKD with last known creatinine fo 2.4 in Dec 2015  . 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 CHF (congestive heart failure)     PAST SURGICAL HISTORY: Past Surgical History  Procedure Laterality Date  . Abdominal hysterectomy    . Eye surgery    . Joint replacement      Knee replacement surgery- bilateral  . Shoulder surgery Bilateral     FAMILY HISTORY Family History  Problem Relation Age of Onset  . CAD Father   . Stroke Father   . Kidney failure Mother     ADVANCED DIRECTIVES:  Patient does not have any living will.  Information is given HEALTH MAINTENANCE: Social History  Substance Use Topics  . Smoking status: Never Smoker   .  Smokeless tobacco: None  . Alcohol Use: No      Allergies  Allergen Reactions  . Penicillins Rash    Current Outpatient Prescriptions  Medication Sig Dispense Refill  . amLODipine (NORVASC) 5 MG tablet Take 5 mg by mouth daily.    . calcitRIOL (ROCALTROL) 0.25 MCG capsule Take 0.25 mcg by mouth daily.     . ciprofloxacin (CIPRO) 500 MG tablet Take  500 mg by mouth at bedtime.     . dabigatran (PRADAXA) 75 MG CAPS capsule Take 75 mg by mouth 2 (two) times daily.    Marland Kitchen levothyroxine (SYNTHROID, LEVOTHROID) 75 MCG tablet Take 75 mcg by mouth daily before breakfast.    . metoprolol (LOPRESSOR) 50 MG tablet Take 1 tablet (50 mg total) by mouth 2 (two) times daily. 60 tablet 0  . sodium bicarbonate 650 MG tablet Take 650 mg by mouth 2 (two) times daily.    Marland Kitchen tolterodine (DETROL LA) 4 MG 24 hr capsule Take 4 mg by mouth daily.    Marland Kitchen torsemide (DEMADEX) 20 MG tablet Take 1 tablet (20 mg total) by mouth daily. 30 tablet 0   No current facility-administered medications for this visit.    OBJECTIVE:  There were no vitals filed for this visit.   There is no weight on file to calculate BMI.    ECOG FS:1 - Symptomatic but completely ambulatory  PHYSICAL EXAM: GENERAL:  Well developed, well nourished, sitting comfortably in the exam room in no acute distress. MENTAL STATUS:  Alert and oriented to person, place and time. HEAD:    Normocephalic, atraumatic, face symmetric, no Cushingoid features. EYES: .  Pupils equal round and reactive to light and accomodation.  No conjunctivitis or scleral icterus. ENT:  Oropharynx clear without lesion.  Tongue normal. Mucous membranes moist.  RESPIRATORY:  Clear to auscultation without rales, wheezes or rhonchi. CARDIOVASCULAR:  Regular rate and rhythm without murmur, rub or gallop.  ABDOMEN:  Soft, non-tender, with active bowel sounds, and no hepatosplenomegaly.  No masses. BACK:  No CVA tenderness.  No tenderness on percussion of the back or rib cage. SKIN:  No rashes, ulcers or lesions. EXTREMITIES: No edema, no skin discoloration or tenderness.  No palpable cords. LYMPH NODES: No palpable cervical, supraclavicular, axillary or inguinal adenopathy  NEUROLOGICAL: Unremarkable. PSYCH:  Appropriate.   LAB RESULTS:  No visits with results within 2 Day(s) from this visit. Latest known visit with results  is:  Admission on 11/02/2014, Discharged on 11/02/2014  Component Date Value Ref Range Status  . WBC 11/02/2014 4.4  3.6 - 11.0 K/uL Final  . RBC 11/02/2014 3.21* 3.80 - 5.20 MIL/uL Final  . Hemoglobin 11/02/2014 9.5* 12.0 - 16.0 g/dL Final  . HCT 11/02/2014 28.2* 35.0 - 47.0 % Final  . MCV 11/02/2014 87.9  80.0 - 100.0 fL Final  . MCH 11/02/2014 29.6  26.0 - 34.0 pg Final  . MCHC 11/02/2014 33.7  32.0 - 36.0 g/dL Final  . RDW 11/02/2014 14.2  11.5 - 14.5 % Final  . Platelets 11/02/2014 161  150 - 440 K/uL Final  . Neutrophils Relative % 11/02/2014 63   Final  . Neutro Abs 11/02/2014 2.8  1.4 - 6.5 K/uL Final  . Lymphocytes Relative 11/02/2014 25   Final  . Lymphs Abs 11/02/2014 1.1  1.0 - 3.6 K/uL Final  . Monocytes Relative 11/02/2014 8   Final  . Monocytes Absolute 11/02/2014 0.4  0.2 - 0.9 K/uL Final  . Eosinophils Relative 11/02/2014 2  Final  . Eosinophils Absolute 11/02/2014 0.1  0 - 0.7 K/uL Final  . Basophils Relative 11/02/2014 2   Final  . Basophils Absolute 11/02/2014 0.1  0 - 0.1 K/uL Final  . Troponin I 11/02/2014 0.07* <0.031 ng/mL Final   Comment: READ BACK AND VERIFIED WITH  NELLIE MONAR AT 4174 11/02/14 SDR        PERSISTENTLY INCREASED TROPONIN VALUES IN THE RANGE OF 0.04-0.49 ng/mL CAN BE SEEN IN:       -UNSTABLE ANGINA       -CONGESTIVE HEART FAILURE       -MYOCARDITIS       -CHEST TRAUMA       -ARRYHTHMIAS       -LATE PRESENTING MYOCARDIAL INFARCTION       -COPD   CLINICAL FOLLOW-UP RECOMMENDED.   Marland Kitchen Sodium 11/02/2014 134* 135 - 145 mmol/L Final  . Potassium 11/02/2014 4.5  3.5 - 5.1 mmol/L Final  . Chloride 11/02/2014 106  101 - 111 mmol/L Final  . CO2 11/02/2014 21* 22 - 32 mmol/L Final  . Glucose, Bld 11/02/2014 99  65 - 99 mg/dL Final  . BUN 11/02/2014 43* 6 - 20 mg/dL Final  . Creatinine, Ser 11/02/2014 2.65* 0.44 - 1.00 mg/dL Final  . Calcium 11/02/2014 8.7* 8.9 - 10.3 mg/dL Final  . GFR calc non Af Amer 11/02/2014 15* >60 mL/min Final  . GFR  calc Af Amer 11/02/2014 18* >60 mL/min Final   Comment: (NOTE) The eGFR has been calculated using the CKD EPI equation. This calculation has not been validated in all clinical situations. eGFR's persistently <60 mL/min signify possible Chronic Kidney Disease.   . 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  New in onset.  Plain x-ray shows kyphoscoliosis.  MEDICAL DECISION MAKING:  All lab data has been in reviewed Continue Sandostatin LAR. Mid back pain appears to be musculoskeletal in origin any pain continues Hydrocodone was given for 30 tablets sig pain continues MRI scan can be scheduled to rule out any metastases from the patient's neuroendocrine tumor Patient cannot take nonsteroidal medication because of renal failure Patient expressed understanding and was in agreement with this plan. She also understands that She can call clinic at any time with any questions, concerns, or complaints.    No matching staging information was found for the patient.  Forest Gleason, MD   11/04/2014 2:29 PM    Patient does have renal failure

## 2014-11-11 ENCOUNTER — Telehealth: Payer: Self-pay | Admitting: *Deleted

## 2014-11-11 NOTE — Telephone Encounter (Signed)
Called pt back but patient could not remember that made phone call this morning or what question she had for MD. Pt stated will call back if remembers.

## 2014-11-11 NOTE — Telephone Encounter (Signed)
Called to states she was here yesterday to see the doctor and wants to know what her condition is. She requests that doctors nurse call her back to explain it to her

## 2014-12-02 ENCOUNTER — Inpatient Hospital Stay: Payer: Medicare Other

## 2014-12-02 ENCOUNTER — Inpatient Hospital Stay (HOSPITAL_BASED_OUTPATIENT_CLINIC_OR_DEPARTMENT_OTHER): Payer: Medicare Other | Admitting: Oncology

## 2014-12-02 ENCOUNTER — Inpatient Hospital Stay: Payer: Medicare Other | Attending: Oncology

## 2014-12-02 VITALS — BP 134/68 | HR 59 | Temp 95.9°F | Wt 133.2 lb

## 2014-12-02 DIAGNOSIS — M549 Dorsalgia, unspecified: Secondary | ICD-10-CM | POA: Diagnosis not present

## 2014-12-02 DIAGNOSIS — D649 Anemia, unspecified: Secondary | ICD-10-CM | POA: Insufficient documentation

## 2014-12-02 DIAGNOSIS — M419 Scoliosis, unspecified: Secondary | ICD-10-CM | POA: Insufficient documentation

## 2014-12-02 DIAGNOSIS — C7A098 Malignant carcinoid tumors of other sites: Secondary | ICD-10-CM | POA: Insufficient documentation

## 2014-12-02 DIAGNOSIS — C7A1 Malignant poorly differentiated neuroendocrine tumors: Secondary | ICD-10-CM

## 2014-12-02 DIAGNOSIS — N189 Chronic kidney disease, unspecified: Secondary | ICD-10-CM

## 2014-12-02 DIAGNOSIS — F0390 Unspecified dementia without behavioral disturbance: Secondary | ICD-10-CM

## 2014-12-02 DIAGNOSIS — I509 Heart failure, unspecified: Secondary | ICD-10-CM | POA: Diagnosis not present

## 2014-12-02 DIAGNOSIS — I129 Hypertensive chronic kidney disease with stage 1 through stage 4 chronic kidney disease, or unspecified chronic kidney disease: Secondary | ICD-10-CM

## 2014-12-02 DIAGNOSIS — Z8673 Personal history of transient ischemic attack (TIA), and cerebral infarction without residual deficits: Secondary | ICD-10-CM | POA: Insufficient documentation

## 2014-12-02 DIAGNOSIS — Z23 Encounter for immunization: Secondary | ICD-10-CM | POA: Insufficient documentation

## 2014-12-02 DIAGNOSIS — I10 Essential (primary) hypertension: Secondary | ICD-10-CM

## 2014-12-02 DIAGNOSIS — Z79899 Other long term (current) drug therapy: Secondary | ICD-10-CM | POA: Diagnosis not present

## 2014-12-02 DIAGNOSIS — D3A8 Other benign neuroendocrine tumors: Secondary | ICD-10-CM

## 2014-12-02 DIAGNOSIS — I4891 Unspecified atrial fibrillation: Secondary | ICD-10-CM | POA: Insufficient documentation

## 2014-12-02 DIAGNOSIS — C786 Secondary malignant neoplasm of retroperitoneum and peritoneum: Secondary | ICD-10-CM | POA: Diagnosis not present

## 2014-12-02 LAB — CBC WITH DIFFERENTIAL/PLATELET
Basophils Absolute: 0 10*3/uL (ref 0–0.1)
Basophils Relative: 0 %
EOS ABS: 0.1 10*3/uL (ref 0–0.7)
EOS PCT: 1 %
HCT: 28.4 % — ABNORMAL LOW (ref 35.0–47.0)
HEMOGLOBIN: 9.4 g/dL — AB (ref 12.0–16.0)
Lymphocytes Relative: 21 %
Lymphs Abs: 1.1 10*3/uL (ref 1.0–3.6)
MCH: 29 pg (ref 26.0–34.0)
MCHC: 33.2 g/dL (ref 32.0–36.0)
MCV: 87.4 fL (ref 80.0–100.0)
MONOS PCT: 5 %
Monocytes Absolute: 0.2 10*3/uL (ref 0.2–0.9)
NEUTROS PCT: 73 %
Neutro Abs: 3.7 10*3/uL (ref 1.4–6.5)
Platelets: 191 10*3/uL (ref 150–440)
RBC: 3.26 MIL/uL — ABNORMAL LOW (ref 3.80–5.20)
RDW: 14.8 % — AB (ref 11.5–14.5)
WBC: 5.2 10*3/uL (ref 3.6–11.0)

## 2014-12-02 LAB — COMPREHENSIVE METABOLIC PANEL
ALT: 14 U/L (ref 14–54)
ANION GAP: 7 (ref 5–15)
AST: 22 U/L (ref 15–41)
Albumin: 3.8 g/dL (ref 3.5–5.0)
Alkaline Phosphatase: 67 U/L (ref 38–126)
BUN: 49 mg/dL — AB (ref 6–20)
CO2: 22 mmol/L (ref 22–32)
Calcium: 8.1 mg/dL — ABNORMAL LOW (ref 8.9–10.3)
Chloride: 106 mmol/L (ref 101–111)
Creatinine, Ser: 2.91 mg/dL — ABNORMAL HIGH (ref 0.44–1.00)
GFR calc Af Amer: 16 mL/min — ABNORMAL LOW (ref >60)
GFR, EST NON AFRICAN AMERICAN: 14 mL/min — AB (ref >60)
GLUCOSE: 131 mg/dL — AB (ref 65–99)
POTASSIUM: 4.5 mmol/L (ref 3.5–5.1)
Sodium: 135 mmol/L (ref 135–145)
TOTAL PROTEIN: 6.3 g/dL — AB (ref 6.5–8.1)
Total Bilirubin: 0.4 mg/dL (ref 0.3–1.2)

## 2014-12-02 MED ORDER — OCTREOTIDE ACETATE 30 MG IM KIT
30.0000 mg | PACK | Freq: Once | INTRAMUSCULAR | Status: AC
  Administered 2014-12-02: 30 mg via INTRAMUSCULAR
  Filled 2014-12-02: qty 1

## 2014-12-02 MED ORDER — INFLUENZA VAC SPLIT QUAD 0.5 ML IM SUSY
0.5000 mL | PREFILLED_SYRINGE | Freq: Once | INTRAMUSCULAR | Status: AC
  Administered 2014-12-02: 0.5 mL via INTRAMUSCULAR
  Filled 2014-12-02: qty 0.5

## 2014-12-02 NOTE — Progress Notes (Signed)
Patient does not have living will.  Never smoked. 

## 2014-12-06 ENCOUNTER — Encounter: Payer: Self-pay | Admitting: Oncology

## 2014-12-06 NOTE — Progress Notes (Signed)
[place] @ [facility] Telephone:([phone]) [phone]  Fax:(336) [place]: Aug 04, 1925  MR#: [id number]  [id number]#:[id number]  Patient Care Team: Kirk Ruths, MD as PCP - General (Internal Medicine)  CHIEF COMPLAINT:  Chief Complaint  Patient presents with  . OTHER    ovarian cystic mass underwent robotic bilateral salpingo-oophorectomy in April of 2011 Neuroendocrine tumor, been differentiated carcinoid 2.Ocereoride scan revealed mets  to retro peroneal lymph node Patient underwent lymph node dissection in July of 2011 3.  Patient has been started on Sandostatin LAR from September of 2012 because of symptoms of carcinoid syndrome  Oncology Flowsheet 07/15/2014 08/12/2014 11/04/2014 12/02/2014  octreotide (SANDOSTATIN LAR) IM 20 mg 30 mg 30 mg 30 mg    INTERVAL HISTORY:  81 year old lady recently moved to [place]. since that time she is on Sandostatin her diarrhea is much improved so as abdominal pain.Patient is getting Sandostatin LAR monthly.  patient's general condition is improving.  No abdominal pain.  No nausea and vomiting.  No diarrhea.  Appetidiet has improved Patient does not have any abdominal pain.  No nausea.  No vomiting.  No diarrhea. December 02, 2014 Patient is here for ongoing evaluation regarding neuroendocrine tumor. At present time lower abdominal discomfort but no significant pain.  No diarrhea.  No rash. Here for continuation of treatment The patient continues to have progressing dementia REVIEW OF SYSTEMS:   GENERAL:  Feels good.  Active.  No fevers, sweats or weight loss. PERFORMANCE STATUS (ECOG): 01 HEENT:  No visual changes, runny nose, sore throat, mouth sores or tenderness. Lungs: No shortness of breath or cough.  No hemoptysis. Cardiac:  No chest pain, palpitations, orthopnea, or PND. GI:  No nausea, vomiting, diarrhea, constipation, melena or hematochezia. GU:  No urgency, frequency, dysuria, or  hematuria. Musculoskeletal: Mid back pain constant dull aching Extremities:  No pain or swelling. Skin:  No rashes or skin changes. Neuro:  No headache, numbness or weakness, balance or coordination issues. Endocrine:  No diabetes, thyroid issues, hot flashes or night sweats. Psych:  No mood changes, depression or anxiety. Pain:  No focal pain. Review of systems:  All other systems reviewed and found to be negative. As per HPI. Otherwise, a complete review of systems is negatve.  PAST MEDICAL HISTORY: Past Medical History  Diagnosis Date  . Hypertension   . Dysrhythmia     Atrial Fibrillation  . Cancer [facility])     Carcinoid Tumor  . Stroke [facility])     No residual neurological deficits  . Chronic kidney disease     CKD with last known creatinine fo 2.4 in Dec 2015  . Malignant poorly differentiated neuroendocrine carcinoma ([place]) 08/12/2014  . CHF (congestive heart failure) ([place])     PAST SURGICAL HISTORY: Past Surgical History  Procedure Laterality Date  . Abdominal hysterectomy    . Eye surgery    . Joint replacement      Knee replacement surgery- bilateral  . Shoulder surgery Bilateral     FAMILY HISTORY Family History  Problem Relation Age of Onset  . CAD Father   . Stroke Father   . Kidney failure Mother     ADVANCED DIRECTIVES:  Patient does not have any living will.  Information is given HEALTH MAINTENANCE: Social History  Substance Use Topics  . Smoking status: Never Smoker   . Smokeless tobacco: None  . Alcohol Use: No      Allergies  Allergen Reactions  . Penicillins Rash  Current Outpatient Prescriptions  Medication Sig Dispense Refill  . amLODipine (NORVASC) 5 MG tablet Take 5 mg by mouth daily.    . calcitRIOL (ROCALTROL) 0.25 MCG capsule Take 0.25 mcg by mouth daily.     . dabigatran (PRADAXA) 75 MG CAPS capsule Take 75 mg by mouth 2 (two) times daily.    Marland Kitchen HYDROcodone-acetaminophen (NORCO/VICODIN) 5-325 MG per tablet Take 1 tablet by mouth  every 6 (six) hours as needed for moderate pain. 30 tablet 0  . levothyroxine (SYNTHROID, LEVOTHROID) 75 MCG tablet Take 75 mcg by mouth daily before breakfast.    . metoprolol (LOPRESSOR) 50 MG tablet Take 1 tablet (50 mg total) by mouth 2 (two) times daily. 60 tablet 0  . sodium bicarbonate 650 MG tablet Take 650 mg by mouth 2 (two) times daily.    Marland Kitchen tolterodine (DETROL LA) 4 MG 24 hr capsule Take 4 mg by mouth daily.    Marland Kitchen torsemide (DEMADEX) 20 MG tablet Take 1 tablet (20 mg total) by mouth daily. 30 tablet 0  . atorvastatin (LIPITOR) 20 MG tablet Take 20 mg by mouth daily.  0   No current facility-administered medications for this visit.    OBJECTIVE:  Filed Vitals:   12/02/14 1411  BP: 134/68  Pulse: 59  Temp: 95.9 F (35.5 C)     Body mass index is 24.35 kg/(m^2).    ECOG FS:1 - Symptomatic but completely ambulatory  PHYSICAL EXAM: GENERAL:  Well developed, well nourished, sitting comfortably in the exam room in no acute distress. MENTAL STATUS:  Alert and oriented to person, place and time. HEAD:    Normocephalic, atraumatic, face symmetric, no Cushingoid features. EYES: .  Pupils equal round and reactive to light and accomodation.  No conjunctivitis or scleral icterus. ENT:  Oropharynx clear without lesion.  Tongue normal. Mucous membranes moist.  RESPIRATORY:  Clear to auscultation without rales, wheezes or rhonchi. CARDIOVASCULAR:  Regular rate and rhythm without murmur, rub or gallop.  ABDOMEN:  Soft, non-tender, with active bowel sounds, and no hepatosplenomegaly.  No masses. BACK:  No CVA tenderness.  No tenderness on percussion of the back or rib cage. SKIN:  No rashes, ulcers or lesions. EXTREMITIES: No edema, no skin discoloration or tenderness.  No palpable cords. LYMPH NODES: No palpable cervical, supraclavicular, axillary or inguinal adenopathy  NEUROLOGICAL: Unremarkable. PSYCH:  Appropriate.   LAB RESULTS:  Appointment on 12/02/2014  Component Date Value  Ref Range Status  . WBC 12/02/2014 5.2  3.6 - 11.0 K/uL Final  . RBC 12/02/2014 3.26* 3.80 - 5.20 MIL/uL Final  . Hemoglobin 12/02/2014 9.4* 12.0 - 16.0 g/dL Final  . HCT 12/02/2014 28.4* 35.0 - 47.0 % Final  . MCV 12/02/2014 87.4  80.0 - 100.0 fL Final  . MCH 12/02/2014 29.0  26.0 - 34.0 pg Final  . MCHC 12/02/2014 33.2  32.0 - 36.0 g/dL Final  . RDW 12/02/2014 14.8* 11.5 - 14.5 % Final  . Platelets 12/02/2014 191  150 - 440 K/uL Final  . Neutrophils Relative % 12/02/2014 73   Final  . Neutro Abs 12/02/2014 3.7  1.4 - 6.5 K/uL Final  . Lymphocytes Relative 12/02/2014 21   Final  . Lymphs Abs 12/02/2014 1.1  1.0 - 3.6 K/uL Final  . Monocytes Relative 12/02/2014 5   Final  . Monocytes Absolute 12/02/2014 0.2  0.2 - 0.9 K/uL Final  . Eosinophils Relative 12/02/2014 1   Final  . Eosinophils Absolute 12/02/2014 0.1  0 - 0.7 K/uL  Final  . Basophils Relative 12/02/2014 0   Final  . Basophils Absolute 12/02/2014 0.0  0 - 0.1 K/uL Final  . Sodium 12/02/2014 135  135 - 145 mmol/L Final  . Potassium 12/02/2014 4.5  3.5 - 5.1 mmol/L Final  . Chloride 12/02/2014 106  101 - 111 mmol/L Final  . CO2 12/02/2014 22  22 - 32 mmol/L Final  . Glucose, Bld 12/02/2014 131* 65 - 99 mg/dL Final  . BUN 12/02/2014 49* 6 - 20 mg/dL Final  . Creatinine, Ser 12/02/2014 2.91* 0.44 - 1.00 mg/dL Final  . Calcium 12/02/2014 8.1* 8.9 - 10.3 mg/dL Final  . Total Protein 12/02/2014 6.3* 6.5 - 8.1 g/dL Final  . Albumin 12/02/2014 3.8  3.5 - 5.0 g/dL Final  . AST 12/02/2014 22  15 - 41 U/L Final  . ALT 12/02/2014 14  14 - 54 U/L Final  . Alkaline Phosphatase 12/02/2014 67  38 - 126 U/L Final  . Total Bilirubin 12/02/2014 0.4  0.3 - 1.2 mg/dL Final  . GFR calc non Af Amer 12/02/2014 14* >60 mL/min Final  . GFR calc Af Amer 12/02/2014 16* >60 mL/min Final   Comment: (NOTE) The eGFR has been calculated using the CKD EPI equation. This calculation has not been validated in all clinical situations. eGFR's persistently  <60 mL/min signify possible Chronic Kidney Disease.   . 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  New in onset.  Plain x-ray shows kyphoscoliosis.  MEDICAL DECISION MAKING:  All lab data has been in reviewed Continue Sandostatin LAR. Patient continues to have anemia.  Very difficult to obtain history well over the last colonoscopy Tumor assessment by 24 hours urine for 5-HIAA Try to get records about colonoscopy Continue present pain management pain is under control Continue Sandostatin LAR every month for 3 months and reevaluate patient  No matching staging information was found for the patient.  Forest Gleason, MD   12/06/2014 4:00 PM    Patient does have renal failure

## 2014-12-14 DIAGNOSIS — C7A098 Malignant carcinoid tumors of other sites: Secondary | ICD-10-CM | POA: Diagnosis not present

## 2014-12-15 ENCOUNTER — Other Ambulatory Visit: Payer: Self-pay | Admitting: *Deleted

## 2014-12-15 DIAGNOSIS — C7A8 Other malignant neuroendocrine tumors: Secondary | ICD-10-CM

## 2014-12-18 LAB — 5 HIAA, QUANTITATIVE, URINE, 24 HOUR
5-HIAA, Ur: 2.6 mg/L
5-HIAA,QUANT.,24 HR URINE: 4.3 mg/(24.h) (ref 0.0–14.9)
TOTAL VOLUME: 1650

## 2014-12-25 DIAGNOSIS — I340 Nonrheumatic mitral (valve) insufficiency: Secondary | ICD-10-CM | POA: Insufficient documentation

## 2014-12-30 ENCOUNTER — Inpatient Hospital Stay: Payer: Medicare Other | Attending: Oncology

## 2014-12-30 ENCOUNTER — Ambulatory Visit: Payer: Medicare Other

## 2014-12-30 DIAGNOSIS — Z79899 Other long term (current) drug therapy: Secondary | ICD-10-CM | POA: Insufficient documentation

## 2014-12-30 DIAGNOSIS — C7A1 Malignant poorly differentiated neuroendocrine tumors: Secondary | ICD-10-CM | POA: Insufficient documentation

## 2015-01-01 ENCOUNTER — Inpatient Hospital Stay: Payer: Medicare Other

## 2015-01-01 DIAGNOSIS — Z79899 Other long term (current) drug therapy: Secondary | ICD-10-CM | POA: Diagnosis not present

## 2015-01-01 DIAGNOSIS — C7A1 Malignant poorly differentiated neuroendocrine tumors: Secondary | ICD-10-CM

## 2015-01-01 MED ORDER — OCTREOTIDE ACETATE 30 MG IM KIT
30.0000 mg | PACK | Freq: Once | INTRAMUSCULAR | Status: AC
Start: 2015-01-01 — End: 2015-01-01
  Administered 2015-01-01: 30 mg via INTRAMUSCULAR
  Filled 2015-01-01: qty 1

## 2015-01-27 ENCOUNTER — Ambulatory Visit: Payer: Medicare Other

## 2015-01-27 ENCOUNTER — Inpatient Hospital Stay: Payer: Medicare Other | Attending: Oncology

## 2015-01-27 DIAGNOSIS — Z79899 Other long term (current) drug therapy: Secondary | ICD-10-CM | POA: Insufficient documentation

## 2015-01-27 DIAGNOSIS — C7A1 Malignant poorly differentiated neuroendocrine tumors: Secondary | ICD-10-CM | POA: Insufficient documentation

## 2015-01-27 DIAGNOSIS — C7B8 Other secondary neuroendocrine tumors: Secondary | ICD-10-CM | POA: Insufficient documentation

## 2015-01-27 MED ORDER — OCTREOTIDE ACETATE 30 MG IM KIT
30.0000 mg | PACK | Freq: Once | INTRAMUSCULAR | Status: AC
  Administered 2015-01-27: 30 mg via INTRAMUSCULAR
  Filled 2015-01-27: qty 1

## 2015-02-24 ENCOUNTER — Inpatient Hospital Stay: Payer: Medicare Other

## 2015-02-24 ENCOUNTER — Inpatient Hospital Stay: Payer: Medicare Other | Admitting: Oncology

## 2015-03-02 ENCOUNTER — Inpatient Hospital Stay: Payer: Medicare Other | Admitting: Oncology

## 2015-03-02 ENCOUNTER — Inpatient Hospital Stay: Payer: Medicare Other

## 2015-03-05 ENCOUNTER — Inpatient Hospital Stay: Payer: Medicare Other | Attending: Oncology

## 2015-03-05 ENCOUNTER — Inpatient Hospital Stay (HOSPITAL_BASED_OUTPATIENT_CLINIC_OR_DEPARTMENT_OTHER): Payer: Medicare Other | Admitting: Oncology

## 2015-03-05 ENCOUNTER — Inpatient Hospital Stay: Payer: Medicare Other

## 2015-03-05 VITALS — BP 134/68 | HR 61 | Temp 96.9°F | Resp 18 | Wt 135.8 lb

## 2015-03-05 DIAGNOSIS — N189 Chronic kidney disease, unspecified: Secondary | ICD-10-CM | POA: Diagnosis not present

## 2015-03-05 DIAGNOSIS — C772 Secondary and unspecified malignant neoplasm of intra-abdominal lymph nodes: Secondary | ICD-10-CM

## 2015-03-05 DIAGNOSIS — C7A1 Malignant poorly differentiated neuroendocrine tumors: Secondary | ICD-10-CM

## 2015-03-05 DIAGNOSIS — Z8673 Personal history of transient ischemic attack (TIA), and cerebral infarction without residual deficits: Secondary | ICD-10-CM

## 2015-03-05 DIAGNOSIS — I129 Hypertensive chronic kidney disease with stage 1 through stage 4 chronic kidney disease, or unspecified chronic kidney disease: Secondary | ICD-10-CM

## 2015-03-05 DIAGNOSIS — Z79899 Other long term (current) drug therapy: Secondary | ICD-10-CM | POA: Diagnosis not present

## 2015-03-05 DIAGNOSIS — F0390 Unspecified dementia without behavioral disturbance: Secondary | ICD-10-CM | POA: Diagnosis not present

## 2015-03-05 DIAGNOSIS — I4891 Unspecified atrial fibrillation: Secondary | ICD-10-CM

## 2015-03-05 DIAGNOSIS — C786 Secondary malignant neoplasm of retroperitoneum and peritoneum: Secondary | ICD-10-CM | POA: Insufficient documentation

## 2015-03-05 DIAGNOSIS — I509 Heart failure, unspecified: Secondary | ICD-10-CM | POA: Insufficient documentation

## 2015-03-05 LAB — CBC WITH DIFFERENTIAL/PLATELET
Basophils Absolute: 0.1 10*3/uL (ref 0–0.1)
Basophils Relative: 3 %
EOS ABS: 0.4 10*3/uL (ref 0–0.7)
EOS PCT: 8 %
HCT: 27.2 % — ABNORMAL LOW (ref 35.0–47.0)
Hemoglobin: 9 g/dL — ABNORMAL LOW (ref 12.0–16.0)
LYMPHS ABS: 1 10*3/uL (ref 1.0–3.6)
LYMPHS PCT: 20 %
MCH: 28.4 pg (ref 26.0–34.0)
MCHC: 33 g/dL (ref 32.0–36.0)
MCV: 86 fL (ref 80.0–100.0)
MONO ABS: 0.3 10*3/uL (ref 0.2–0.9)
Monocytes Relative: 6 %
Neutro Abs: 3.2 10*3/uL (ref 1.4–6.5)
Neutrophils Relative %: 63 %
PLATELETS: 196 10*3/uL (ref 150–440)
RBC: 3.16 MIL/uL — ABNORMAL LOW (ref 3.80–5.20)
RDW: 14.7 % — AB (ref 11.5–14.5)
WBC: 5 10*3/uL (ref 3.6–11.0)

## 2015-03-05 LAB — COMPREHENSIVE METABOLIC PANEL
ALT: 13 U/L — ABNORMAL LOW (ref 14–54)
ANION GAP: 7 (ref 5–15)
AST: 23 U/L (ref 15–41)
Albumin: 3.7 g/dL (ref 3.5–5.0)
Alkaline Phosphatase: 64 U/L (ref 38–126)
BUN: 71 mg/dL — ABNORMAL HIGH (ref 6–20)
CHLORIDE: 106 mmol/L (ref 101–111)
CO2: 22 mmol/L (ref 22–32)
Calcium: 8.5 mg/dL — ABNORMAL LOW (ref 8.9–10.3)
Creatinine, Ser: 3.28 mg/dL — ABNORMAL HIGH (ref 0.44–1.00)
GFR, EST AFRICAN AMERICAN: 14 mL/min — AB (ref >60)
GFR, EST NON AFRICAN AMERICAN: 12 mL/min — AB (ref >60)
Glucose, Bld: 121 mg/dL — ABNORMAL HIGH (ref 65–99)
POTASSIUM: 4.6 mmol/L (ref 3.5–5.1)
SODIUM: 135 mmol/L (ref 135–145)
Total Bilirubin: 0.5 mg/dL (ref 0.3–1.2)
Total Protein: 6.4 g/dL — ABNORMAL LOW (ref 6.5–8.1)

## 2015-03-05 LAB — MAGNESIUM: Magnesium: 2 mg/dL (ref 1.7–2.4)

## 2015-03-05 NOTE — Progress Notes (Signed)
[place]  Telephone:(336) [phone]  Fax:([phone]) [place] DOB: Jun 05, 1925  MR#: [id number]  JYN#:[id number]  Patient Care Team: Kirk Ruths, MD as PCP - General (Internal Medicine)  CHIEF COMPLAINT:  Chief Complaint  Patient presents with  . 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 Patient has been on Sandostatin LAR monthly since September 2012. Overall she is doing fairly well. She denies any abdominal pain, nausea, vomiting, or diarrhea. Patient reports having good appetite with good fluid intake. Previously documented as having progressive dementia.  REVIEW OF SYSTEMS:   Review of Systems  Constitutional: Negative for fever, chills, weight loss, malaise/fatigue and diaphoresis.  HENT: Negative.   Eyes: Negative.   Respiratory: Negative for cough, hemoptysis, sputum production, shortness of breath and wheezing.   Cardiovascular: Negative for chest pain, palpitations, orthopnea, claudication, leg swelling and PND.  Gastrointestinal: Negative for heartburn, nausea, vomiting, abdominal pain, diarrhea, constipation, blood in stool and melena.  Genitourinary: Negative.   Musculoskeletal: Negative.   Skin: Negative.   Neurological: Negative for dizziness, tingling, focal weakness, seizures and weakness.  Endo/Heme/Allergies: Does not bruise/bleed easily.  Psychiatric/Behavioral: Negative for depression. The patient is not nervous/anxious and does not have insomnia.     As per HPI. Otherwise, a complete review of systems is negatve.  ONCOLOGY HISTORY: Oncology History   1. Ovarian cystic mass underwent robotic bilateral salpingo-oophorectomy in April of 2011. Neuroendocrine tumor, poorly differentiated carcinoid 2. Octreotide scan revealed mets to retro peritoneal lymph  node. Patient underwent lymph node dissection in July of 2011. 3. Patient has been started on Sandostatin LAR from September of 2012 because of symptoms of carcinoid syndrome.     Malignant poorly differentiated neuroendocrine carcinoma ([place])   08/12/2014 Initial Diagnosis Malignant poorly differentiated neuroendocrine carcinoma ([place])    PAST MEDICAL HISTORY: Past Medical History  Diagnosis Date  . Hypertension   . Dysrhythmia     Atrial Fibrillation  . Cancer [facility])     Carcinoid Tumor  . Stroke [facility])     No residual neurological deficits  . Chronic kidney disease     CKD with last known creatinine fo 2.4 in Dec 2015  . Malignant poorly differentiated neuroendocrine carcinoma ([place]) 08/12/2014  . CHF (congestive heart failure) ([place])     PAST SURGICAL HISTORY: Past Surgical History  Procedure Laterality Date  . Abdominal hysterectomy    . Eye surgery    . Joint replacement      Knee replacement surgery- bilateral  . Shoulder surgery Bilateral     FAMILY HISTORY Family History  Problem Relation Age of Onset  . CAD Father   . Stroke Father   . Kidney failure Mother     GYNECOLOGIC HISTORY:  No LMP recorded. Patient has had a hysterectomy.     ADVANCED DIRECTIVES:    HEALTH MAINTENANCE: Social History  Substance Use Topics  . Smoking status: Never Smoker   . Smokeless tobacco: Not on file  . Alcohol Use: No     Allergies  Allergen Reactions  . Penicillins Rash    Current Outpatient Prescriptions  Medication Sig Dispense Refill  . amLODipine (NORVASC) 5 MG tablet Take 5 mg by mouth daily.    Marland Kitchen atorvastatin (LIPITOR) 20 MG tablet Take 20 mg by mouth daily.  0  . calcitRIOL (ROCALTROL) 0.25 MCG capsule Take 0.25 mcg by mouth daily.     Marland Kitchen  dabigatran (PRADAXA) 75 MG CAPS capsule Take 75 mg by mouth 2 (two) times daily.    Marland Kitchen HYDROcodone-acetaminophen (NORCO/VICODIN) 5-325 MG per tablet Take 1 tablet by mouth every 6 (six) hours as needed for moderate pain. 30  tablet 0  . levothyroxine (SYNTHROID, LEVOTHROID) 75 MCG tablet Take 75 mcg by mouth daily before breakfast.    . metoprolol (LOPRESSOR) 50 MG tablet Take 1 tablet (50 mg total) by mouth 2 (two) times daily. 60 tablet 0  . sodium bicarbonate 650 MG tablet Take 650 mg by mouth 2 (two) times daily.    Marland Kitchen tolterodine (DETROL LA) 4 MG 24 hr capsule Take 4 mg by mouth daily.    Marland Kitchen torsemide (DEMADEX) 20 MG tablet Take 1 tablet (20 mg total) by mouth daily. 30 tablet 0   No current facility-administered medications for this visit.    OBJECTIVE: BP 134/68 mmHg  Pulse 61  Temp(Src) 96.9 F (36.1 C) (Tympanic)  Resp 18  Wt 135 lb 12.9 oz (61.6 kg)   Body mass index is 24.83 kg/(m^2).    ECOG FS:0 - Asymptomatic  General: Well-developed, well-nourished, no acute distress. Eyes: Pink conjunctiva, anicteric sclera. HEENT: Normocephalic, moist mucous membranes, clear oropharnyx. Lungs: Clear to auscultation bilaterally. Heart: Regular rate and rhythm. No rubs, murmurs, or gallops. Abdomen: Soft, nontender, nondistended. No organomegaly noted, normoactive bowel sounds. Musculoskeletal: No edema, cyanosis, or clubbing. Neuro: Alert, answering all questions appropriately. Cranial nerves grossly intact. Skin: No rashes or petechiae noted. Psych: Normal affect. Lymphatics: No cervical or clavicular lymphadenopathy.   LAB RESULTS:  Appointment on 03/05/2015  Component Date Value Ref Range Status  . WBC 03/05/2015 5.0  3.6 - 11.0 K/uL Final  . RBC 03/05/2015 3.16* 3.80 - 5.20 MIL/uL Final  . Hemoglobin 03/05/2015 9.0* 12.0 - 16.0 g/dL Final  . HCT 03/05/2015 27.2* 35.0 - 47.0 % Final  . MCV 03/05/2015 86.0  80.0 - 100.0 fL Final  . MCH 03/05/2015 28.4  26.0 - 34.0 pg Final  . MCHC 03/05/2015 33.0  32.0 - 36.0 g/dL Final  . RDW 03/05/2015 14.7* 11.5 - 14.5 % Final  . Platelets 03/05/2015 196  150 - 440 K/uL Final  . Neutrophils Relative % 03/05/2015 63   Final  . Neutro Abs 03/05/2015 3.2  1.4  - 6.5 K/uL Final  . Lymphocytes Relative 03/05/2015 20   Final  . Lymphs Abs 03/05/2015 1.0  1.0 - 3.6 K/uL Final  . Monocytes Relative 03/05/2015 6   Final  . Monocytes Absolute 03/05/2015 0.3  0.2 - 0.9 K/uL Final  . Eosinophils Relative 03/05/2015 8   Final  . Eosinophils Absolute 03/05/2015 0.4  0 - 0.7 K/uL Final  . Basophils Relative 03/05/2015 3   Final  . Basophils Absolute 03/05/2015 0.1  0 - 0.1 K/uL Final  . Sodium 03/05/2015 135  135 - 145 mmol/L Final  . Potassium 03/05/2015 4.6  3.5 - 5.1 mmol/L Final  . Chloride 03/05/2015 106  101 - 111 mmol/L Final  . CO2 03/05/2015 22  22 - 32 mmol/L Final  . Glucose, Bld 03/05/2015 121* 65 - 99 mg/dL Final  . BUN 03/05/2015 71* 6 - 20 mg/dL Final  . Creatinine, Ser 03/05/2015 3.28* 0.44 - 1.00 mg/dL Final  . Calcium 03/05/2015 8.5* 8.9 - 10.3 mg/dL Final  . Total Protein 03/05/2015 6.4* 6.5 - 8.1 g/dL Final  . Albumin 03/05/2015 3.7  3.5 - 5.0 g/dL Final  . AST 03/05/2015 23  15 - 41 U/L  Final  . ALT 03/05/2015 13* 14 - 54 U/L Final  . Alkaline Phosphatase 03/05/2015 64  38 - 126 U/L Final  . Total Bilirubin 03/05/2015 0.5  0.3 - 1.2 mg/dL Final  . GFR calc non Af Amer 03/05/2015 12* >60 mL/min Final  . GFR calc Af Amer 03/05/2015 14* >60 mL/min Final   Comment: (NOTE) The eGFR has been calculated using the CKD EPI equation. This calculation has not been validated in all clinical situations. eGFR's persistently <60 mL/min signify possible Chronic Kidney Disease.   . Anion gap 03/05/2015 7  5 - 15 Final  . Magnesium 03/05/2015 2.0  1.7 - 2.4 mg/dL Final    STUDIES: No results found.  ASSESSMENT:  Poorly differentiated neuroendocrine tumor of the ovary, with metastasis to the peritoneum.  PLAN:   1. Neuroendocrine tumor of the ovary. Patient overall feels very well. We will continue with Sandostatin LAR monthly. 2. Anemia. Patient reports good energy and minimal fatigue for her age. It is hard to collect history of her last  colonoscopy due to dementia. We'll continue to follow. 3. Chronic kidney disease. Patient has seen Dr. Rolly Salter in the past. She has however not been back to his office since April 2016. Her creatinine at this time is elevated at 3.28. I contacted someone in Dr. Rolly Salter office to schedule follow-up for patient. His nurse stated that she would call her with an appointment for follow-up.  Patient will return to this office for Sandostatin LAR monthly; and again in 3 months to follow up with Dr. Oliva Bustard.  Patient expressed understanding and was in agreement with this plan. She also understands that She can call clinic at any time with any questions, concerns, or complaints.   Dr. Oliva Bustard was available for consultation and review of plan of care for this patient.   Evlyn Kanner, NP   03/05/2015 4:18 PM

## 2015-04-05 ENCOUNTER — Inpatient Hospital Stay: Payer: Medicare Other

## 2015-04-09 ENCOUNTER — Inpatient Hospital Stay: Payer: Medicare Other | Attending: Oncology

## 2015-04-09 DIAGNOSIS — C7A8 Other malignant neuroendocrine tumors: Secondary | ICD-10-CM | POA: Insufficient documentation

## 2015-04-09 DIAGNOSIS — Z79899 Other long term (current) drug therapy: Secondary | ICD-10-CM | POA: Insufficient documentation

## 2015-04-09 DIAGNOSIS — C7A1 Malignant poorly differentiated neuroendocrine tumors: Secondary | ICD-10-CM

## 2015-04-09 MED ORDER — OCTREOTIDE ACETATE 30 MG IM KIT
30.0000 mg | PACK | Freq: Once | INTRAMUSCULAR | Status: AC
Start: 2015-04-09 — End: 2015-04-09
  Administered 2015-04-09: 30 mg via INTRAMUSCULAR
  Filled 2015-04-09: qty 1

## 2015-05-03 ENCOUNTER — Inpatient Hospital Stay: Payer: Medicare Other | Attending: Oncology

## 2015-05-03 DIAGNOSIS — Z79899 Other long term (current) drug therapy: Secondary | ICD-10-CM | POA: Insufficient documentation

## 2015-05-03 DIAGNOSIS — C7A1 Malignant poorly differentiated neuroendocrine tumors: Secondary | ICD-10-CM | POA: Insufficient documentation

## 2015-05-14 ENCOUNTER — Inpatient Hospital Stay: Payer: Medicare Other

## 2015-05-14 DIAGNOSIS — C7A1 Malignant poorly differentiated neuroendocrine tumors: Secondary | ICD-10-CM | POA: Diagnosis present

## 2015-05-14 DIAGNOSIS — Z79899 Other long term (current) drug therapy: Secondary | ICD-10-CM | POA: Diagnosis not present

## 2015-05-14 MED ORDER — OCTREOTIDE ACETATE 30 MG IM KIT
30.0000 mg | PACK | Freq: Once | INTRAMUSCULAR | Status: AC
  Administered 2015-05-14: 30 mg via INTRAMUSCULAR
  Filled 2015-05-14: qty 1

## 2015-06-09 ENCOUNTER — Inpatient Hospital Stay: Payer: Medicare Other | Attending: Family Medicine

## 2015-06-09 ENCOUNTER — Ambulatory Visit: Payer: Medicare Other | Admitting: Family Medicine

## 2015-06-09 ENCOUNTER — Inpatient Hospital Stay: Payer: Medicare Other

## 2015-06-09 ENCOUNTER — Other Ambulatory Visit: Payer: Medicare Other

## 2015-06-09 ENCOUNTER — Ambulatory Visit: Payer: Medicare Other

## 2015-06-09 ENCOUNTER — Inpatient Hospital Stay (HOSPITAL_BASED_OUTPATIENT_CLINIC_OR_DEPARTMENT_OTHER): Payer: Medicare Other | Admitting: Family Medicine

## 2015-06-09 VITALS — BP 144/62 | HR 53 | Temp 97.7°F | Wt 132.1 lb

## 2015-06-09 DIAGNOSIS — R5383 Other fatigue: Secondary | ICD-10-CM

## 2015-06-09 DIAGNOSIS — Z7901 Long term (current) use of anticoagulants: Secondary | ICD-10-CM

## 2015-06-09 DIAGNOSIS — C786 Secondary malignant neoplasm of retroperitoneum and peritoneum: Secondary | ICD-10-CM | POA: Insufficient documentation

## 2015-06-09 DIAGNOSIS — I129 Hypertensive chronic kidney disease with stage 1 through stage 4 chronic kidney disease, or unspecified chronic kidney disease: Secondary | ICD-10-CM

## 2015-06-09 DIAGNOSIS — I4891 Unspecified atrial fibrillation: Secondary | ICD-10-CM | POA: Insufficient documentation

## 2015-06-09 DIAGNOSIS — R531 Weakness: Secondary | ICD-10-CM

## 2015-06-09 DIAGNOSIS — Z79899 Other long term (current) drug therapy: Secondary | ICD-10-CM

## 2015-06-09 DIAGNOSIS — D649 Anemia, unspecified: Secondary | ICD-10-CM | POA: Insufficient documentation

## 2015-06-09 DIAGNOSIS — C7A1 Malignant poorly differentiated neuroendocrine tumors: Secondary | ICD-10-CM

## 2015-06-09 DIAGNOSIS — I509 Heart failure, unspecified: Secondary | ICD-10-CM | POA: Insufficient documentation

## 2015-06-09 DIAGNOSIS — M546 Pain in thoracic spine: Secondary | ICD-10-CM | POA: Insufficient documentation

## 2015-06-09 DIAGNOSIS — N189 Chronic kidney disease, unspecified: Secondary | ICD-10-CM | POA: Diagnosis not present

## 2015-06-09 DIAGNOSIS — F0390 Unspecified dementia without behavioral disturbance: Secondary | ICD-10-CM

## 2015-06-09 DIAGNOSIS — Z8673 Personal history of transient ischemic attack (TIA), and cerebral infarction without residual deficits: Secondary | ICD-10-CM | POA: Diagnosis not present

## 2015-06-09 DIAGNOSIS — M419 Scoliosis, unspecified: Secondary | ICD-10-CM

## 2015-06-09 LAB — CBC WITH DIFFERENTIAL/PLATELET
Basophils Absolute: 0.1 10*3/uL (ref 0–0.1)
Basophils Relative: 2 %
EOS ABS: 0.2 10*3/uL (ref 0–0.7)
EOS PCT: 3 %
HCT: 26.7 % — ABNORMAL LOW (ref 35.0–47.0)
Hemoglobin: 9 g/dL — ABNORMAL LOW (ref 12.0–16.0)
LYMPHS ABS: 1.1 10*3/uL (ref 1.0–3.6)
Lymphocytes Relative: 19 %
MCH: 29.8 pg (ref 26.0–34.0)
MCHC: 33.8 g/dL (ref 32.0–36.0)
MCV: 88.1 fL (ref 80.0–100.0)
MONO ABS: 0.4 10*3/uL (ref 0.2–0.9)
Monocytes Relative: 6 %
Neutro Abs: 4.2 10*3/uL (ref 1.4–6.5)
Neutrophils Relative %: 70 %
PLATELETS: 157 10*3/uL (ref 150–440)
RBC: 3.03 MIL/uL — AB (ref 3.80–5.20)
RDW: 16.1 % — AB (ref 11.5–14.5)
WBC: 5.9 10*3/uL (ref 3.6–11.0)

## 2015-06-09 LAB — COMPREHENSIVE METABOLIC PANEL
ALT: 11 U/L — AB (ref 14–54)
ANION GAP: 7 (ref 5–15)
AST: 21 U/L (ref 15–41)
Albumin: 4.2 g/dL (ref 3.5–5.0)
Alkaline Phosphatase: 53 U/L (ref 38–126)
BUN: 62 mg/dL — ABNORMAL HIGH (ref 6–20)
CHLORIDE: 110 mmol/L (ref 101–111)
CO2: 21 mmol/L — AB (ref 22–32)
CREATININE: 3.98 mg/dL — AB (ref 0.44–1.00)
Calcium: 9 mg/dL (ref 8.9–10.3)
GFR, EST AFRICAN AMERICAN: 11 mL/min — AB (ref >60)
GFR, EST NON AFRICAN AMERICAN: 9 mL/min — AB (ref >60)
Glucose, Bld: 166 mg/dL — ABNORMAL HIGH (ref 65–99)
POTASSIUM: 4.8 mmol/L (ref 3.5–5.1)
SODIUM: 138 mmol/L (ref 135–145)
Total Bilirubin: 0.4 mg/dL (ref 0.3–1.2)
Total Protein: 6.7 g/dL (ref 6.5–8.1)

## 2015-06-09 MED ORDER — OCTREOTIDE ACETATE 30 MG IM KIT
30.0000 mg | PACK | Freq: Once | INTRAMUSCULAR | Status: AC
  Administered 2015-06-09: 30 mg via INTRAMUSCULAR
  Filled 2015-06-09: qty 1

## 2015-06-09 NOTE — Progress Notes (Signed)
[place]  Telephone:(336) [phone]  Fax:(336) [place] DOB: 1925/09/23  MR#: [id number]  MWN#:[id number]  Patient Care Team: Kirk Ruths, MD as PCP - General (Internal Medicine)  CHIEF COMPLAINT:  Chief Complaint  Patient presents with  . 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 Patient has been on Sandostatin LAR monthly since September 2012. Overall she is doing fairly well. She denies any abdominal pain, nausea, vomiting, or diarrhea. Patient reports having good appetite with good fluid intake.  She does have some complaint of mid thoracic spine pain.  Pain has been evaluated by x-ray in September 2016, admitted to the thoracic spine scoliosis as well as kyphosis and mild degenerative changes. Previously documented as having progressive dementia and daughter is with her today.  REVIEW OF SYSTEMS:   Review of Systems  Constitutional: Positive for malaise/fatigue. Negative for fever, chills, weight loss and diaphoresis.  HENT: Negative.   Eyes: Negative.   Respiratory: Negative for cough, hemoptysis, sputum production, shortness of breath and wheezing.   Cardiovascular: Negative for chest pain, palpitations, orthopnea, claudication, leg swelling and PND.  Gastrointestinal: Negative for heartburn, nausea, vomiting, abdominal pain, diarrhea, constipation, blood in stool and melena.  Genitourinary: Negative.   Musculoskeletal: Positive for back pain.  Skin: Negative.   Neurological: Negative for dizziness, tingling, focal weakness, seizures and weakness.  Endo/Heme/Allergies: Does not bruise/bleed easily.  Psychiatric/Behavioral: Positive for memory loss. Negative for depression. The patient is not nervous/anxious and does not have insomnia.     As per HPI. Otherwise, a complete  review of systems is negatve.  ONCOLOGY HISTORY: Oncology History   1. Ovarian cystic mass underwent robotic bilateral salpingo-oophorectomy in April of 2011. Neuroendocrine tumor, poorly differentiated carcinoid 2. Octreotide scan revealed mets to retro peritoneal lymph node. Patient underwent lymph node dissection in July of 2011. 3. Patient has been started on Sandostatin LAR from September of 2012 because of symptoms of carcinoid syndrome.     Malignant poorly differentiated neuroendocrine carcinoma ([place])   08/12/2014 Initial Diagnosis Malignant poorly differentiated neuroendocrine carcinoma ([place])    PAST MEDICAL HISTORY: Past Medical History  Diagnosis Date  . Hypertension   . Dysrhythmia     Atrial Fibrillation  . Cancer [facility])     Carcinoid Tumor  . Stroke [facility])     No residual neurological deficits  . Chronic kidney disease     CKD with last known creatinine fo 2.4 in Dec 2015  . Malignant poorly differentiated neuroendocrine carcinoma ([place]) 08/12/2014  . CHF (congestive heart failure) ([place])     PAST SURGICAL HISTORY: Past Surgical History  Procedure Laterality Date  . Abdominal hysterectomy    . Eye surgery    . Joint replacement      Knee replacement surgery- bilateral  . Shoulder surgery Bilateral     FAMILY HISTORY Family History  Problem Relation Age of Onset  . CAD Father   . Stroke Father   . Kidney failure Mother     GYNECOLOGIC HISTORY:  No LMP recorded. Patient has had a hysterectomy.     ADVANCED DIRECTIVES:    HEALTH MAINTENANCE: Social History  Substance Use Topics  . Smoking status: Never Smoker   . Smokeless tobacco: Not on file  . Alcohol Use: No       Allergies  Allergen Reactions  . Penicillins Rash  Current Outpatient Prescriptions  Medication Sig Dispense Refill  . amLODipine (NORVASC) 5 MG tablet     . atorvastatin (LIPITOR) 20 MG tablet     . calcitRIOL (ROCALTROL) 0.25 MCG capsule Take 0.25 mcg by mouth daily.      . dabigatran (PRADAXA) 75 MG CAPS capsule Take 75 mg by mouth 2 (two) times daily.    Marland Kitchen HYDROcodone-acetaminophen (NORCO/VICODIN) 5-325 MG per tablet Take 1 tablet by mouth every 6 (six) hours as needed for moderate pain. 30 tablet 0  . levothyroxine (SYNTHROID, LEVOTHROID) 88 MCG tablet TAKE 1 TABLET BY MOUTH EVERY DAY ON EMPTY STOMACH WITH GLASS OF WATER 30 TO 60 MINS BEFORE BREAKFAST  11  . metoprolol (LOPRESSOR) 50 MG tablet Take 1 tablet (50 mg total) by mouth 2 (two) times daily. 60 tablet 0  . sodium chloride 1 g tablet Take 1 g by mouth 3 (three) times daily.  12  . torsemide (DEMADEX) 10 MG tablet      No current facility-administered medications for this visit.    OBJECTIVE: BP 144/62 mmHg  Pulse 53  Temp(Src) 97.7 F (36.5 C) (Oral)  Wt 132 lb 0.9 oz (59.9 kg)   Body mass index is 24.15 kg/(m^2).    ECOG FS:1 - Symptomatic but completely ambulatory  General: Well-developed, well-nourished, no acute distress. Eyes: Pink conjunctiva, anicteric sclera. HEENT: Normocephalic, moist mucous membranes, clear oropharnyx. Lungs: Clear to auscultation bilaterally. Heart: Regular rate and rhythm. No rubs, murmurs, or gallops. Abdomen: Soft, nontender, nondistended. No organomegaly noted, normoactive bowel sounds. Musculoskeletal: No edema, cyanosis, or clubbing. Neuro: Alert, answering all questions appropriately. Cranial nerves grossly intact. Skin: No rashes or petechiae noted. Psych: Normal affect. Lymphatics: No cervical, clavicular LAD.   LAB RESULTS:  Appointment on 06/09/2015  Component Date Value Ref Range Status  . WBC 06/09/2015 5.9  3.6 - 11.0 K/uL Final  . RBC 06/09/2015 3.03* 3.80 - 5.20 MIL/uL Final  . Hemoglobin 06/09/2015 9.0* 12.0 - 16.0 g/dL Final  . HCT 06/09/2015 26.7* 35.0 - 47.0 % Final  . MCV 06/09/2015 88.1  80.0 - 100.0 fL Final  . MCH 06/09/2015 29.8  26.0 - 34.0 pg Final  . MCHC 06/09/2015 33.8  32.0 - 36.0 g/dL Final  . RDW 06/09/2015 16.1* 11.5  - 14.5 % Final  . Platelets 06/09/2015 157  150 - 440 K/uL Final  . Neutrophils Relative % 06/09/2015 70   Final  . Neutro Abs 06/09/2015 4.2  1.4 - 6.5 K/uL Final  . Lymphocytes Relative 06/09/2015 19   Final  . Lymphs Abs 06/09/2015 1.1  1.0 - 3.6 K/uL Final  . Monocytes Relative 06/09/2015 6   Final  . Monocytes Absolute 06/09/2015 0.4  0.2 - 0.9 K/uL Final  . Eosinophils Relative 06/09/2015 3   Final  . Eosinophils Absolute 06/09/2015 0.2  0 - 0.7 K/uL Final  . Basophils Relative 06/09/2015 2   Final  . Basophils Absolute 06/09/2015 0.1  0 - 0.1 K/uL Final  . Sodium 06/09/2015 138  135 - 145 mmol/L Final  . Potassium 06/09/2015 4.8  3.5 - 5.1 mmol/L Final  . Chloride 06/09/2015 110  101 - 111 mmol/L Final  . CO2 06/09/2015 21* 22 - 32 mmol/L Final  . Glucose, Bld 06/09/2015 166* 65 - 99 mg/dL Final  . BUN 06/09/2015 62* 6 - 20 mg/dL Final  . Creatinine, Ser 06/09/2015 3.98* 0.44 - 1.00 mg/dL Final  . Calcium 06/09/2015 9.0  8.9 - 10.3 mg/dL Final  .  Total Protein 06/09/2015 6.7  6.5 - 8.1 g/dL Final  . Albumin 06/09/2015 4.2  3.5 - 5.0 g/dL Final  . AST 06/09/2015 21  15 - 41 U/L Final  . ALT 06/09/2015 11* 14 - 54 U/L Final  . Alkaline Phosphatase 06/09/2015 53  38 - 126 U/L Final  . Total Bilirubin 06/09/2015 0.4  0.3 - 1.2 mg/dL Final  . GFR calc non Af Amer 06/09/2015 9* >60 mL/min Final  . GFR calc Af Amer 06/09/2015 11* >60 mL/min Final   Comment: (NOTE) The eGFR has been calculated using the CKD EPI equation. This calculation has not been validated in all clinical situations. eGFR's persistently <60 mL/min signify possible Chronic Kidney Disease.   . Anion gap 06/09/2015 7  5 - 15 Final    STUDIES: No results found.  ASSESSMENT:   Poorly differentiated neuroendocrine tumor of the ovary, with metastasis to the peritoneum.  PLAN:   1. Neuroendocrine tumor of the ovary. Patient overall feels very well. We will continue with Sandostatin LAR monthly. 2. Anemia.  Patient reports good energy and minimal fatigue for her age. It is hard to collect history of her last colonoscopy due to dementia. We'll continue to follow. 3. Chronic kidney disease. Patient has seen Dr. Rolly Salter in the past. She has however not been back to his office since April 2016. Her creatinine at this time is elevated at 3.98.  Creatinine is overall increasing slowly. Discussed with patient and daughter the need to continue with follow-up with Dr. Abigail Butts.  Patient will return to this office for Sandostatin LAR monthly; and again in 3 months for continued follow-up.  Patient expressed understanding and was in agreement with this plan. She also understands that She can call clinic at any time with any questions, concerns, or complaints.   Dr. Oliva Bustard was available for consultation and review of plan of care for this patient.   Evlyn Kanner, NP   06/09/2015 3:24 PM

## 2015-07-07 ENCOUNTER — Inpatient Hospital Stay: Payer: Medicare Other | Attending: Family Medicine

## 2015-07-07 VITALS — BP 146/75 | HR 66 | Temp 98.6°F | Resp 18

## 2015-07-07 DIAGNOSIS — Z79899 Other long term (current) drug therapy: Secondary | ICD-10-CM | POA: Insufficient documentation

## 2015-07-07 DIAGNOSIS — C7A1 Malignant poorly differentiated neuroendocrine tumors: Secondary | ICD-10-CM | POA: Diagnosis present

## 2015-07-07 MED ORDER — OCTREOTIDE ACETATE 30 MG IM KIT
30.0000 mg | PACK | Freq: Once | INTRAMUSCULAR | Status: AC
  Administered 2015-07-07: 30 mg via INTRAMUSCULAR
  Filled 2015-07-07: qty 1

## 2015-07-16 ENCOUNTER — Ambulatory Visit
Admission: RE | Admit: 2015-07-16 | Discharge: 2015-07-16 | Disposition: A | Discharge status: Home / Self Care | Payer: Medicare Other | Source: Ambulatory Visit | Attending: Nephrology | Admitting: Nephrology

## 2015-07-16 ENCOUNTER — Other Ambulatory Visit: Payer: Self-pay | Admitting: Nephrology

## 2015-07-16 DIAGNOSIS — Z111 Encounter for screening for respiratory tuberculosis: Secondary | ICD-10-CM | POA: Insufficient documentation

## 2015-07-16 DIAGNOSIS — A150 Tuberculosis of lung: Secondary | ICD-10-CM

## 2015-07-27 ENCOUNTER — Ambulatory Visit
Admission: RE | Admit: 2015-07-27 | Discharge: 2015-07-27 | Disposition: A | Discharge status: Home / Self Care | Payer: Medicare Other | Source: Ambulatory Visit | Attending: Vascular Surgery | Admitting: Vascular Surgery

## 2015-07-27 ENCOUNTER — Encounter
Admission: RE | Disposition: A | Discharge status: Home / Self Care | Payer: Self-pay | Source: Ambulatory Visit | Attending: Vascular Surgery

## 2015-07-27 DIAGNOSIS — Z841 Family history of disorders of kidney and ureter: Secondary | ICD-10-CM | POA: Diagnosis not present

## 2015-07-27 DIAGNOSIS — Z809 Family history of malignant neoplasm, unspecified: Secondary | ICD-10-CM | POA: Diagnosis not present

## 2015-07-27 DIAGNOSIS — E039 Hypothyroidism, unspecified: Secondary | ICD-10-CM | POA: Insufficient documentation

## 2015-07-27 DIAGNOSIS — E785 Hyperlipidemia, unspecified: Secondary | ICD-10-CM | POA: Diagnosis not present

## 2015-07-27 DIAGNOSIS — E871 Hypo-osmolality and hyponatremia: Secondary | ICD-10-CM | POA: Insufficient documentation

## 2015-07-27 DIAGNOSIS — R531 Weakness: Secondary | ICD-10-CM | POA: Diagnosis not present

## 2015-07-27 DIAGNOSIS — I639 Cerebral infarction, unspecified: Secondary | ICD-10-CM | POA: Diagnosis not present

## 2015-07-27 DIAGNOSIS — N186 End stage renal disease: Secondary | ICD-10-CM | POA: Diagnosis present

## 2015-07-27 DIAGNOSIS — Z9049 Acquired absence of other specified parts of digestive tract: Secondary | ICD-10-CM | POA: Insufficient documentation

## 2015-07-27 DIAGNOSIS — N2581 Secondary hyperparathyroidism of renal origin: Secondary | ICD-10-CM | POA: Diagnosis not present

## 2015-07-27 DIAGNOSIS — F0390 Unspecified dementia without behavioral disturbance: Secondary | ICD-10-CM | POA: Insufficient documentation

## 2015-07-27 DIAGNOSIS — Z880 Allergy status to penicillin: Secondary | ICD-10-CM | POA: Diagnosis not present

## 2015-07-27 DIAGNOSIS — I4891 Unspecified atrial fibrillation: Secondary | ICD-10-CM | POA: Insufficient documentation

## 2015-07-27 DIAGNOSIS — N185 Chronic kidney disease, stage 5: Secondary | ICD-10-CM | POA: Diagnosis not present

## 2015-07-27 DIAGNOSIS — E872 Acidosis: Secondary | ICD-10-CM | POA: Insufficient documentation

## 2015-07-27 DIAGNOSIS — I120 Hypertensive chronic kidney disease with stage 5 chronic kidney disease or end stage renal disease: Secondary | ICD-10-CM | POA: Insufficient documentation

## 2015-07-27 DIAGNOSIS — G4700 Insomnia, unspecified: Secondary | ICD-10-CM | POA: Diagnosis not present

## 2015-07-27 DIAGNOSIS — D631 Anemia in chronic kidney disease: Secondary | ICD-10-CM | POA: Insufficient documentation

## 2015-07-27 DIAGNOSIS — R0602 Shortness of breath: Secondary | ICD-10-CM | POA: Diagnosis not present

## 2015-07-27 DIAGNOSIS — H9193 Unspecified hearing loss, bilateral: Secondary | ICD-10-CM | POA: Diagnosis present

## 2015-07-27 DIAGNOSIS — Z8543 Personal history of malignant neoplasm of ovary: Secondary | ICD-10-CM | POA: Diagnosis not present

## 2015-07-27 HISTORY — PX: PERIPHERAL VASCULAR CATHETERIZATION: SHX172C

## 2015-07-27 LAB — CBC
HCT: 27.5 % — ABNORMAL LOW (ref 35.0–47.0)
Hemoglobin: 9.3 g/dL — ABNORMAL LOW (ref 12.0–16.0)
MCH: 29.7 pg (ref 26.0–34.0)
MCHC: 33.6 g/dL (ref 32.0–36.0)
MCV: 88.3 fL (ref 80.0–100.0)
PLATELETS: 188 10*3/uL (ref 150–440)
RBC: 3.12 MIL/uL — AB (ref 3.80–5.20)
RDW: 15.1 % — AB (ref 11.5–14.5)
WBC: 6.4 10*3/uL (ref 3.6–11.0)

## 2015-07-27 LAB — BASIC METABOLIC PANEL
Anion gap: 10 (ref 5–15)
BUN: 56 mg/dL — AB (ref 6–20)
CALCIUM: 8.9 mg/dL (ref 8.9–10.3)
CHLORIDE: 109 mmol/L (ref 101–111)
CO2: 19 mmol/L — ABNORMAL LOW (ref 22–32)
CREATININE: 3.41 mg/dL — AB (ref 0.44–1.00)
GFR calc non Af Amer: 11 mL/min — ABNORMAL LOW (ref >60)
GFR, EST AFRICAN AMERICAN: 13 mL/min — AB (ref >60)
Glucose, Bld: 103 mg/dL — ABNORMAL HIGH (ref 65–99)
Potassium: 4.3 mmol/L (ref 3.5–5.1)
SODIUM: 138 mmol/L (ref 135–145)

## 2015-07-27 LAB — PROTIME-INR
INR: 1.32
PROTHROMBIN TIME: 16.5 s — AB (ref 11.4–15.0)

## 2015-07-27 SURGERY — DIALYSIS/PERMA CATHETER INSERTION
Anesthesia: Moderate Sedation

## 2015-07-27 MED ORDER — HEPARIN SODIUM (PORCINE) 10000 UNIT/ML IJ SOLN
INTRAMUSCULAR | Status: AC
  Filled 2015-07-27: qty 1

## 2015-07-27 MED ORDER — SODIUM CHLORIDE 0.9 % IV SOLN
INTRAVENOUS | Status: DC
  Administered 2015-07-27: 09:00:00 via INTRAVENOUS

## 2015-07-27 MED ORDER — FENTANYL CITRATE (PF) 100 MCG/2ML IJ SOLN
INTRAMUSCULAR | Status: AC
  Filled 2015-07-27: qty 2

## 2015-07-27 MED ORDER — MIDAZOLAM HCL 2 MG/2ML IJ SOLN
INTRAMUSCULAR | Status: DC | PRN
  Administered 2015-07-27: 2 mg via INTRAVENOUS

## 2015-07-27 MED ORDER — CLINDAMYCIN PHOSPHATE 300 MG/50ML IV SOLN
INTRAVENOUS | Status: DC
Start: 2015-07-27 — End: 2015-07-27
  Filled 2015-07-27: qty 50

## 2015-07-27 MED ORDER — LIDOCAINE HCL (PF) 1 % IJ SOLN
INTRAMUSCULAR | Status: AC
  Filled 2015-07-27: qty 30

## 2015-07-27 MED ORDER — CLINDAMYCIN PHOSPHATE 300 MG/50ML IV SOLN
INTRAVENOUS | Status: AC
  Administered 2015-07-27: 10:00:00
  Filled 2015-07-27: qty 50

## 2015-07-27 MED ORDER — LIDOCAINE-EPINEPHRINE (PF) 1 %-1:200000 IJ SOLN
INTRAMUSCULAR | Status: DC | PRN
  Administered 2015-07-27: 20 mL via INTRADERMAL

## 2015-07-27 MED ORDER — HEPARIN (PORCINE) IN NACL 2-0.9 UNIT/ML-% IJ SOLN
INTRAMUSCULAR | Status: AC
  Filled 2015-07-27: qty 500

## 2015-07-27 MED ORDER — MIDAZOLAM HCL 5 MG/5ML IJ SOLN
INTRAMUSCULAR | Status: AC
  Filled 2015-07-27: qty 5

## 2015-07-27 MED ORDER — FENTANYL CITRATE (PF) 100 MCG/2ML IJ SOLN
INTRAMUSCULAR | Status: DC | PRN
  Administered 2015-07-27: 50 ug via INTRAVENOUS

## 2015-07-27 SURGICAL SUPPLY — 8 items
CATH PALINDROME RT-P 15FX19CM (CATHETERS) ×3 IMPLANT
DERMABOND ADVANCED (GAUZE/BANDAGES/DRESSINGS) ×2
DERMABOND ADVANCED .7 DNX12 (GAUZE/BANDAGES/DRESSINGS) ×1 IMPLANT
PACK ANGIOGRAPHY (CUSTOM PROCEDURE TRAY) ×3 IMPLANT
SUT MNCRL 4-0 (SUTURE) ×2
SUT MNCRL 4-0 27XMFL (SUTURE) ×1
SUTURE MNCRL 4-0 27XMF (SUTURE) ×1 IMPLANT
TOWEL OR 17X26 4PK STRL BLUE (TOWEL DISPOSABLE) ×3 IMPLANT

## 2015-07-27 NOTE — H&P (Signed)
[facility] History & Physical Update  The patient was interviewed and re-examined.  The patient's previous History and Physical has been reviewed and is unchanged.  There is no change in the plan of care. We plan to proceed with the scheduled procedure.  Schnier, Dolores Lory, MD  07/27/2015, 10:35 AM

## 2015-07-27 NOTE — Discharge Instructions (Signed)

## 2015-07-27 NOTE — Op Note (Signed)
[facility]   OPERATIVE NOTE     PROCEDURE: 1. Insertion right IJ tunneled dialysis catheter placement 2. Catheter placement and cannulation under ultrasound and fluoroscopic guidance  PRE-OPERATIVE DIAGNOSIS: end-stage renal requiring hemodialysis  POST-OPERATIVE DIAGNOSIS: same as above  SURGEON: Katha Cabal, M.D.  ANESTHESIA: Conscious sedation was administered under my direct supervision. IV Versed plus fentanyl were utilized. Continuous ECG, pulse oximetry and blood pressure was monitored throughout the entire procedure. A total of 2 milligrams of Versed and 50 micrograms of fentanyl were utilized.  Conscious sedation was for a total of 40 minutes.  ESTIMATED BLOOD LOSS: Minimal  FINDING(S): 1.  Tips of the catheter in the right atrium on fluoroscopy 2.  No obvious pneumothorax on fluoroscopy  SPECIMEN(S):  none  INDICATIONS:   Kerry Walsh is a 82 y.o. female  presents with end stage renal disease.  Therefore, the patient requires a tunneled dialysis catheter placement.  The patient is informed of  the risks catheter placement include but are not limited to: bleeding, infection, central venous injury, pneumothorax, possible venous stenosis, possible malpositioning in the venous system, and possible infections related to long-term catheter presence.  The patient was aware of these risks and agreed to proceed.  DESCRIPTION: The patient was taken back to Special Procedure suite.  Prior to sedation, the patient was given IV antibiotics.  After obtaining adequate sedation, the patient was prepped and draped in the standard fashion for a chest or neck tunneled dialysis catheter placement.  Appropriate Time Out is called.   The the neck and chest wall are then infiltrated with 1% Lidocaine with epinepherine.  A 19 cm tip to cuff palindrome catheter is then selected, opened on the back table and prepped. Under ultrasound guidance, the right internal  jugular vein was cannulated with the Seldinger needle.  A J-wire was then advanced under fluoroscopic guidance into the inferior vena cava and the wire was secured.  Small counter incision was then made at the wire insertion site. A small pocket was fashioned with blunt dissection to allow easier passage of the cuff.  The dilator and peel-away sheath are then advanced over the wire under fluoroscopic guidance. The catheters and advanced through the peel-away sheath after removal of the wire. It is approximated to the chest wall after verifying the tips at the atrial caval junction and an exit site is selected.  Small incision is made at the selected exit site and the tunneling device was passed subcutaneously to the neck counter incision. Catheter is then pulled through the subcutaneous tunnel. The catheter is then verified for tip position under fluoroscopy, transected and the hub assembly connected.    Each port was tested by aspirating and flushing.  No resistance was noted.  Each port was then thoroughly flushed with heparinized saline.  The catheter was secured in placed with two interrupted stitches of 0 silk tied to the catheter.  The neck incision was closed with a U-stitch of 4-0 Monocryl.  The neck and chest incision were cleaned and sterile bandages applied including a Biopatch.  Each port was then packed with concentrated heparin (1000 Units/mL) at the manufacturer recommended volumes to each port.  Sterile caps were applied to each port.  On completion fluoroscopy, the tips of the catheter were in the right atrium, and there was no evidence of pneumothorax.  COMPLICATIONS: None  CONDITION: Unchanged   Katha Cabal M.D. [facility] Office: [phone]   07/27/2015, 10:36 AM

## 2015-08-04 ENCOUNTER — Inpatient Hospital Stay: Payer: Medicare Other | Attending: Family Medicine

## 2015-08-04 DIAGNOSIS — C7A1 Malignant poorly differentiated neuroendocrine tumors: Secondary | ICD-10-CM | POA: Insufficient documentation

## 2015-08-04 DIAGNOSIS — Z79899 Other long term (current) drug therapy: Secondary | ICD-10-CM | POA: Insufficient documentation

## 2015-08-04 MED ORDER — OCTREOTIDE ACETATE 30 MG IM KIT
30.0000 mg | PACK | Freq: Once | INTRAMUSCULAR | Status: AC
  Administered 2015-08-04: 30 mg via INTRAMUSCULAR
  Filled 2015-08-04: qty 1

## 2015-08-26 ENCOUNTER — Other Ambulatory Visit: Payer: Self-pay | Admitting: Vascular Surgery

## 2015-08-30 ENCOUNTER — Inpatient Hospital Stay: Payer: Medicare Other

## 2015-08-30 ENCOUNTER — Inpatient Hospital Stay: Payer: Medicare Other | Attending: Family Medicine | Admitting: Family Medicine

## 2015-08-30 VITALS — BP 162/78 | HR 87 | Temp 98.2°F | Resp 18 | Wt 131.4 lb

## 2015-08-30 DIAGNOSIS — I129 Hypertensive chronic kidney disease with stage 1 through stage 4 chronic kidney disease, or unspecified chronic kidney disease: Secondary | ICD-10-CM | POA: Insufficient documentation

## 2015-08-30 DIAGNOSIS — R531 Weakness: Secondary | ICD-10-CM

## 2015-08-30 DIAGNOSIS — F0390 Unspecified dementia without behavioral disturbance: Secondary | ICD-10-CM

## 2015-08-30 DIAGNOSIS — I4891 Unspecified atrial fibrillation: Secondary | ICD-10-CM | POA: Insufficient documentation

## 2015-08-30 DIAGNOSIS — N189 Chronic kidney disease, unspecified: Secondary | ICD-10-CM | POA: Diagnosis not present

## 2015-08-30 DIAGNOSIS — Z79899 Other long term (current) drug therapy: Secondary | ICD-10-CM | POA: Insufficient documentation

## 2015-08-30 DIAGNOSIS — Z7901 Long term (current) use of anticoagulants: Secondary | ICD-10-CM

## 2015-08-30 DIAGNOSIS — C7A1 Malignant poorly differentiated neuroendocrine tumors: Secondary | ICD-10-CM | POA: Insufficient documentation

## 2015-08-30 DIAGNOSIS — Z992 Dependence on renal dialysis: Secondary | ICD-10-CM | POA: Insufficient documentation

## 2015-08-30 DIAGNOSIS — I509 Heart failure, unspecified: Secondary | ICD-10-CM | POA: Diagnosis not present

## 2015-08-30 DIAGNOSIS — Z8673 Personal history of transient ischemic attack (TIA), and cerebral infarction without residual deficits: Secondary | ICD-10-CM | POA: Diagnosis not present

## 2015-08-30 DIAGNOSIS — M4184 Other forms of scoliosis, thoracic region: Secondary | ICD-10-CM

## 2015-08-30 DIAGNOSIS — C772 Secondary and unspecified malignant neoplasm of intra-abdominal lymph nodes: Secondary | ICD-10-CM | POA: Insufficient documentation

## 2015-08-30 DIAGNOSIS — M40209 Unspecified kyphosis, site unspecified: Secondary | ICD-10-CM | POA: Insufficient documentation

## 2015-08-30 DIAGNOSIS — R5383 Other fatigue: Secondary | ICD-10-CM | POA: Diagnosis not present

## 2015-08-30 LAB — CBC WITH DIFFERENTIAL/PLATELET
Basophils Absolute: 0.1 10*3/uL (ref 0–0.1)
Basophils Relative: 2 %
Eosinophils Absolute: 0.1 10*3/uL (ref 0–0.7)
Eosinophils Relative: 2 %
HCT: 24.4 % — ABNORMAL LOW (ref 35.0–47.0)
Hemoglobin: 8.2 g/dL — ABNORMAL LOW (ref 12.0–16.0)
LYMPHS PCT: 21 %
Lymphs Abs: 1.2 10*3/uL (ref 1.0–3.6)
MCH: 30.1 pg (ref 26.0–34.0)
MCHC: 33.6 g/dL (ref 32.0–36.0)
MCV: 89.6 fL (ref 80.0–100.0)
MONO ABS: 0.4 10*3/uL (ref 0.2–0.9)
MONOS PCT: 7 %
NEUTROS ABS: 3.7 10*3/uL (ref 1.4–6.5)
Neutrophils Relative %: 68 %
PLATELETS: 155 10*3/uL (ref 150–440)
RBC: 2.72 MIL/uL — ABNORMAL LOW (ref 3.80–5.20)
RDW: 14.8 % — ABNORMAL HIGH (ref 11.5–14.5)
WBC: 5.5 10*3/uL (ref 3.6–11.0)

## 2015-08-30 LAB — BASIC METABOLIC PANEL
ANION GAP: 7 (ref 5–15)
BUN: 22 mg/dL — ABNORMAL HIGH (ref 6–20)
CHLORIDE: 103 mmol/L (ref 101–111)
CO2: 25 mmol/L (ref 22–32)
Calcium: 8.6 mg/dL — ABNORMAL LOW (ref 8.9–10.3)
Creatinine, Ser: 2.94 mg/dL — ABNORMAL HIGH (ref 0.44–1.00)
GFR, EST AFRICAN AMERICAN: 15 mL/min — AB (ref >60)
GFR, EST NON AFRICAN AMERICAN: 13 mL/min — AB (ref >60)
Glucose, Bld: 103 mg/dL — ABNORMAL HIGH (ref 65–99)
POTASSIUM: 3.6 mmol/L (ref 3.5–5.1)
SODIUM: 135 mmol/L (ref 135–145)

## 2015-08-30 MED ORDER — OCTREOTIDE ACETATE 30 MG IM KIT
30.0000 mg | PACK | Freq: Once | INTRAMUSCULAR | Status: AC
  Administered 2015-08-30: 30 mg via INTRAMUSCULAR
  Filled 2015-08-30: qty 1

## 2015-08-30 NOTE — Progress Notes (Signed)
Patient is here for a follow up, no complaints

## 2015-08-30 NOTE — Progress Notes (Signed)
Kerry Walsh  [phone]) [phone]  Fax:([phone]) [place] DOB: 12/28/1925  MR#: [id number]  FTD#:[id number]  Patient Care Team: Kirk Ruths, MD as PCP - General (Internal Medicine)  CHIEF COMPLAINT:  Chief Complaint  Patient presents with  . 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 Patient has been on Sandostatin LAR monthly since September 2012. Overall she is doing fairly well. She denies any abdominal pain, nausea, vomiting, or diarrhea. Patient reports having good appetite with good fluid intake.  She does have some complaint of mid thoracic spine pain.  Pain has been evaluated by x-ray in September 2016, noted to have thoracic spine scoliosis as well as kyphosis and mild degenerative changes. Previously documented as having progressive dementia and daughter is with her today.  REVIEW OF SYSTEMS:   Review of Systems  Constitutional: Positive for malaise/fatigue. Negative for fever, chills, weight loss and diaphoresis.  HENT: Negative.   Eyes: Negative.   Respiratory: Negative for cough, hemoptysis, sputum production, shortness of breath and wheezing.   Cardiovascular: Negative for chest pain, palpitations, orthopnea, claudication, leg swelling and PND.  Gastrointestinal: Negative for heartburn, nausea, vomiting, abdominal pain, diarrhea, constipation, blood in stool and melena.  Genitourinary: Negative.   Musculoskeletal: Positive for back pain.  Skin: Negative.   Neurological: Negative for dizziness, tingling, focal weakness, seizures and weakness.  Endo/Heme/Allergies: Does not bruise/bleed easily.  Psychiatric/Behavioral: Positive for memory loss. Negative for depression. The patient is not nervous/anxious and does not have insomnia.     As per HPI. Otherwise, a complete review  of systems is negatve.  ONCOLOGY HISTORY: Oncology History   1. Ovarian cystic mass underwent robotic bilateral salpingo-oophorectomy in April of 2011. Neuroendocrine tumor, poorly differentiated carcinoid 2. Octreotide scan revealed mets to retro peritoneal lymph node. Patient underwent lymph node dissection in July of 2011. 3. Patient has been started on Sandostatin LAR from September of 2012 because of symptoms of carcinoid syndrome.     Malignant poorly differentiated neuroendocrine carcinoma ([place])   08/12/2014 Initial Diagnosis Malignant poorly differentiated neuroendocrine carcinoma ([place])    PAST MEDICAL HISTORY: Past Medical History  Diagnosis Date  . Hypertension   . Dysrhythmia     Atrial Fibrillation  . Cancer [facility])     Carcinoid Tumor  . Stroke [facility])     No residual neurological deficits  . Chronic kidney disease     CKD with last known creatinine fo 2.4 in Dec 2015  . Malignant poorly differentiated neuroendocrine carcinoma ([place]) 08/12/2014  . CHF (congestive heart failure) ([place])     PAST SURGICAL HISTORY: Past Surgical History  Procedure Laterality Date  . Abdominal hysterectomy    . Eye surgery    . Joint replacement      Knee replacement surgery- bilateral  . Shoulder surgery Bilateral   . Peripheral vascular catheterization N/A 07/27/2015    Procedure: Dialysis/Perma Catheter Insertion;  Surgeon: Katha Cabal, MD;  Location: [place] [facility];  Service: Cardiovascular;  Laterality: N/A;    FAMILY HISTORY Family History  Problem Relation Age of Onset  . CAD Father   . Stroke Father   . Kidney failure Mother     GYNECOLOGIC HISTORY:  No LMP recorded. Patient has had a hysterectomy.     ADVANCED DIRECTIVES:    HEALTH MAINTENANCE: Social History  Substance Use Topics  . Smoking  status: Never Smoker   . Smokeless tobacco: Not on file  . Alcohol Use: No       Allergies  Allergen Reactions  . Penicillins Rash    Current Outpatient  Prescriptions  Medication Sig Dispense Refill  . amLODipine (NORVASC) 5 MG tablet Take 5 mg by mouth daily.     Marland Kitchen atorvastatin (LIPITOR) 20 MG tablet Take 20 mg by mouth daily.     . calcitRIOL (ROCALTROL) 0.25 MCG capsule Take 0.25 mcg by mouth daily. Reported on 07/27/2015    . dabigatran (PRADAXA) 75 MG CAPS capsule Take 75 mg by mouth 2 (two) times daily. Reported on 07/27/2015    . enalapril (VASOTEC) 5 MG tablet Take 5 mg by mouth daily.    Marland Kitchen HYDROcodone-acetaminophen (NORCO/VICODIN) 5-325 MG per tablet Take 1 tablet by mouth every 6 (six) hours as needed for moderate pain. 30 tablet 0  . metoprolol (LOPRESSOR) 50 MG tablet Take 1 tablet (50 mg total) by mouth 2 (two) times daily. 60 tablet 0  . sodium chloride 1 g tablet Take 1 g by mouth 3 (three) times daily.  12  . tolterodine (DETROL LA) 4 MG 24 hr capsule Take 4 mg by mouth daily.    Marland Kitchen torsemide (DEMADEX) 10 MG tablet 20 mg daily.     . traMADol (ULTRAM) 50 MG tablet Take 50 mg by mouth every 6 (six) hours as needed.    Marland Kitchen levothyroxine (SYNTHROID, LEVOTHROID) 75 MCG tablet Take 75 mcg by mouth daily before breakfast.    . levothyroxine (SYNTHROID, LEVOTHROID) 88 MCG tablet Reported on 07/27/2015  11   No current facility-administered medications for this visit.    OBJECTIVE: BP 162/78 mmHg  Pulse 87  Temp(Src) 98.2 F (36.8 C) (Tympanic)  Resp 18  Wt 131 lb 6.3 oz (59.6 kg)   Body mass index is 24.03 kg/(m^2).    ECOG FS:1 - Symptomatic but completely ambulatory  General: Well-developed, well-nourished, no acute distress. Eyes: Pink conjunctiva, anicteric sclera. HEENT: Normocephalic, moist mucous membranes, clear oropharnyx. Lungs: Clear to auscultation bilaterally. Heart: Regular rate and rhythm. No rubs, murmurs, or gallops. Abdomen: Soft, nontender, nondistended. No organomegaly noted, normoactive bowel sounds. Musculoskeletal: No edema, cyanosis, or clubbing. Neuro: Alert, answering all questions appropriately. Cranial  nerves grossly intact. Skin: No rashes or petechiae noted. Psych: Normal affect. Dementia. Lymphatics: No cervical, clavicular LAD.   LAB RESULTS:  Appointment on 08/30/2015  Component Date Value Ref Range Status  . WBC 08/30/2015 5.5  3.6 - 11.0 K/uL Final  . RBC 08/30/2015 2.72* 3.80 - 5.20 MIL/uL Final  . Hemoglobin 08/30/2015 8.2* 12.0 - 16.0 g/dL Final  . HCT 08/30/2015 24.4* 35.0 - 47.0 % Final  . MCV 08/30/2015 89.6  80.0 - 100.0 fL Final  . MCH 08/30/2015 30.1  26.0 - 34.0 pg Final  . MCHC 08/30/2015 33.6  32.0 - 36.0 g/dL Final  . RDW 08/30/2015 14.8* 11.5 - 14.5 % Final  . Platelets 08/30/2015 155  150 - 440 K/uL Final  . Neutrophils Relative % 08/30/2015 68   Final  . Neutro Abs 08/30/2015 3.7  1.4 - 6.5 K/uL Final  . Lymphocytes Relative 08/30/2015 21   Final  . Lymphs Abs 08/30/2015 1.2  1.0 - 3.6 K/uL Final  . Monocytes Relative 08/30/2015 7   Final  . Monocytes Absolute 08/30/2015 0.4  0.2 - 0.9 K/uL Final  . Eosinophils Relative 08/30/2015 2   Final  . Eosinophils Absolute 08/30/2015 0.1  0 - 0.7  K/uL Final  . Basophils Relative 08/30/2015 2   Final  . Basophils Absolute 08/30/2015 0.1  0 - 0.1 K/uL Final  . Sodium 08/30/2015 135  135 - 145 mmol/L Final  . Potassium 08/30/2015 3.6  3.5 - 5.1 mmol/L Final  . Chloride 08/30/2015 103  101 - 111 mmol/L Final  . CO2 08/30/2015 25  22 - 32 mmol/L Final  . Glucose, Bld 08/30/2015 103* 65 - 99 mg/dL Final  . BUN 08/30/2015 22* 6 - 20 mg/dL Final  . Creatinine, Ser 08/30/2015 2.94* 0.44 - 1.00 mg/dL Final  . Calcium 08/30/2015 8.6* 8.9 - 10.3 mg/dL Final  . GFR calc non Af Amer 08/30/2015 13* >60 mL/min Final  . GFR calc Af Amer 08/30/2015 15* >60 mL/min Final   Comment: (NOTE) The eGFR has been calculated using the CKD EPI equation. This calculation has not been validated in all clinical situations. eGFR's persistently <60 mL/min signify possible Chronic Kidney Disease.   . Anion gap 08/30/2015 7  5 - 15 Final     STUDIES: No results found.  ASSESSMENT:   Poorly differentiated neuroendocrine tumor of the ovary, with metastasis to the peritoneum.  PLAN:   1. Neuroendocrine tumor of the ovary. Patient overall feels very well. We will continue with Sandostatin LAR monthly. 2. Anemia. Patient reports good energy and minimal fatigue for her age. Most likely secondary to renal dysfunction. We'll continue to follow. 3. Chronic kidney disease. Creatinine is now 2.94. She was started on dialysis last week, received first treatment on Friday 08/27/2015. Followed by Dr. Rolly Salter.  Patient will return to this office for Sandostatin LAR monthly; and again in 3 months for continued follow-up.  Patient expressed understanding and was in agreement with this plan. She also understands that She can call clinic at any time with any questions, concerns, or complaints.   Dr. Grayland Ormond was available for consultation and review of plan of care for this patient.  Evlyn Kanner, NP   08/30/2015 3:24 PM

## 2015-09-03 ENCOUNTER — Encounter
Admission: RE | Admit: 2015-09-03 | Discharge: 2015-09-03 | Disposition: A | Discharge status: Home / Self Care | Payer: Medicare Other | Source: Ambulatory Visit | Attending: Vascular Surgery | Admitting: Vascular Surgery

## 2015-09-03 DIAGNOSIS — I509 Heart failure, unspecified: Secondary | ICD-10-CM | POA: Insufficient documentation

## 2015-09-03 DIAGNOSIS — I639 Cerebral infarction, unspecified: Secondary | ICD-10-CM | POA: Insufficient documentation

## 2015-09-03 DIAGNOSIS — Z01810 Encounter for preprocedural cardiovascular examination: Secondary | ICD-10-CM | POA: Insufficient documentation

## 2015-09-03 DIAGNOSIS — Z01812 Encounter for preprocedural laboratory examination: Secondary | ICD-10-CM | POA: Diagnosis not present

## 2015-09-03 DIAGNOSIS — N289 Disorder of kidney and ureter, unspecified: Secondary | ICD-10-CM | POA: Diagnosis not present

## 2015-09-03 HISTORY — DX: Hypothyroidism, unspecified: E03.9

## 2015-09-03 LAB — CBC WITH DIFFERENTIAL/PLATELET
BASOS ABS: 0.1 10*3/uL (ref 0–0.1)
BASOS PCT: 1 %
EOS ABS: 0.1 10*3/uL (ref 0–0.7)
EOS PCT: 2 %
HCT: 23.3 % — ABNORMAL LOW (ref 35.0–47.0)
Hemoglobin: 7.9 g/dL — ABNORMAL LOW (ref 12.0–16.0)
Lymphocytes Relative: 16 %
Lymphs Abs: 0.8 10*3/uL — ABNORMAL LOW (ref 1.0–3.6)
MCH: 30.5 pg (ref 26.0–34.0)
MCHC: 34.1 g/dL (ref 32.0–36.0)
MCV: 89.5 fL (ref 80.0–100.0)
Monocytes Absolute: 0.4 10*3/uL (ref 0.2–0.9)
Monocytes Relative: 7 %
NEUTROS PCT: 74 %
Neutro Abs: 3.9 10*3/uL (ref 1.4–6.5)
PLATELETS: 137 10*3/uL — AB (ref 150–440)
RBC: 2.6 MIL/uL — AB (ref 3.80–5.20)
RDW: 14.7 % — ABNORMAL HIGH (ref 11.5–14.5)
WBC: 5.3 10*3/uL (ref 3.6–11.0)

## 2015-09-03 LAB — TYPE AND SCREEN
ABO/RH(D): O POS
ANTIBODY SCREEN: NEGATIVE

## 2015-09-03 LAB — PROTIME-INR
INR: 0.98
Prothrombin Time: 13.2 s (ref 11.4–15.0)

## 2015-09-03 LAB — SURGICAL PCR SCREEN
MRSA, PCR: NEGATIVE
STAPHYLOCOCCUS AUREUS: NEGATIVE

## 2015-09-03 LAB — BASIC METABOLIC PANEL
Anion gap: 6 (ref 5–15)
BUN: 11 mg/dL (ref 6–20)
CALCIUM: 8.7 mg/dL — AB (ref 8.9–10.3)
CO2: 30 mmol/L (ref 22–32)
CREATININE: 2.39 mg/dL — AB (ref 0.44–1.00)
Chloride: 101 mmol/L (ref 101–111)
GFR calc non Af Amer: 17 mL/min — ABNORMAL LOW (ref >60)
GFR, EST AFRICAN AMERICAN: 20 mL/min — AB (ref >60)
Glucose, Bld: 129 mg/dL — ABNORMAL HIGH (ref 65–99)
Potassium: 3.2 mmol/L — ABNORMAL LOW (ref 3.5–5.1)
Sodium: 137 mmol/L (ref 135–145)

## 2015-09-03 LAB — APTT: aPTT: 26 seconds (ref 24–36)

## 2015-09-03 NOTE — Pre-Procedure Instructions (Signed)
PT MEDICAL HISTORY AND CARDIO NOTE REVIEWED WITH DR VAN STAVERN. NO FURTHER ORDERS.

## 2015-09-03 NOTE — Pre-Procedure Instructions (Addendum)
ANESTHESIA - LBBB NOTED ON EKG AS FAR BACK AS 11/27/13 IN EPIC, MOST RECENT CARDIO NOTE  Progress Notes - in this encounter Isaias Cowman, MD - 05/18/2015 11:00 AM EDT Formatting of this note may be different from the original. .apest Established Patient Visit   Chief Complaint: Chief Complaint  Patient presents with  . Follow-up  4 months  Date of Service: 05/18/2015 Date of Birth: September 11, 1925 PCP: Harrold Donath, MD  History of Present Illness: Ms. Baire is a 82 y.o.female patient who returns for   1. Paroxysmal atrial fibrillation 2. Essential hypertension 3. Acute on chronic diastolic congestive heart 4. Severe mitral regurgitation 5. Hyperlipidemia 6. Stage IV CKD 7. SIADH  2D echocardiogram 09/26/2014 revealed LVEF of 35-40% with severe mitral regurgitation and moderate aortic insufficiency. The patient was evaluated but Dr. Jenne Pane at [facility] for mitral valve repair versus mitral clip procedure. He felt that the patient may not be a good surgical candidate, but potentially could be a candidate for mitral valve clip if symptoms worsen.  The patient returns today, reports doing well. She denies chest pain. She has chronic exertional dyspnea. She has occasional palpitations proximally 1 month ago, the patient did have an episode of racing heart middle night. EMS was called, however, the patient did not undergo further evaluation. The patient denies any recurrence.  The patient has paroxysmal atrial fibrillation, chads Vasc score 3, currently on low-dose Pradaxa 75 mg b.i.d. due to history of chronic kidney disease with GFR 15-29. Patient denies any bleeding side effects.  The patient has essential hypertension, systolic blood pressure mildly elevated on metoprolol tartrate, amlodipine and furosemide, which are well tolerated without apparent side effects. Patient currently takes sodium chloride tablets with a history of SIADH.  The patient has  hyperlipidemia, currently on atorvastatin which is tolerated well tolerated without apparent side effects followed by her primary care provider. The patient follows a low-cholesterol diet.  Past Medical and Surgical History  Past Medical History Past Medical History  Diagnosis Date  . A-fib , unspecified (CMS-[name])  . Anemia, unspecified  . CAD (coronary artery disease)  . Carcinoid tumor of ovary 2011  With removal 2011.  . Cataract cortical, senile  . Cholelithiasis  . CKD (chronic kidney disease)  . Hypertension  . Hypothyroid, unspecified  . Infection of prosthetic knee joint, subsequent encounter 01/12/2014  E coli - s/p I and D 11/26/2013 Treated with 6 weeks IV ceftazidime Needs lifelong abx suppression  . Migraine  . Osteoarthritis  . Osteoarthritis  . Osteoporosis, post-menopausal  . Stroke (CMS-[name])   Past Surgical History She has a past surgical history that includes Joint replacement (Right, 4/13); Joint replacement (Left, 2011); Joint replacement (Left); Back surgery; Hemorrhoidectomy External; Hysterectomy; BSO (2011); Laparoscopy W/Biopsy; and Irrigation and debridement of septic knee with exchange of polyethylene (Right, 11/27/13).   Medications and Allergies  Current Medications  Current Outpatient Prescriptions  Medication Sig Dispense Refill  . amLODIPine (NORVASC) 5 MG tablet TAKE 1 TABLET BY MOUTH IN THE MORNING 136 tablet 1  . atorvastatin (LIPITOR) 20 MG tablet TAKE 1 TABLET BY MOUTH EVERY DAY 330 tablet 0  . calcitRIOL (ROCALTROL) 0.25 MCG capsule Take 1 capsule (0.25 mcg total) by mouth once daily. 30 capsule 11  . ciprofloxacin HCl (CIPRO) 500 MG tablet Take 500 mg by mouth once daily.  . dabigatran (PRADAXA) 75 mg capsule Take 1 capsule (75 mg total) by mouth 2 (two) times daily. 60 capsule 5  . enalapril (VASOTEC)  5 MG tablet TAKE 1 TABLET BY MOUTH IN THE MORNING 136 tablet 1  . levothyroxine (SYNTHROID, LEVOTHROID) 75 MCG tablet Take 1 tablet (75 mcg  total) by mouth once daily. Take on an empty stomach with a glass of water at least 30-60 minutes before breakfast. 90 tablet 1  . metoprolol tartrate (LOPRESSOR) 50 MG tablet Take 50 mg by mouth once daily.  . sodium bicarbonate 650 MG tablet Take 650 mg by mouth 2 (two) times daily.  . sodium chloride 1 gram tablet Take 1 g by mouth 3 (three) times daily. 1  . TORsemide (DEMADEX) 10 MG tablet TAKE 1 TABLET BY MOUTH EVERY DAY 30 tablet 9  . traMADol (ULTRAM) 50 mg tablet Take 1-2 tablets (50-100 mg total) by mouth 4 (four) times daily as needed for Pain. 100 tablet 2   No current facility-administered medications for this visit.   Allergies: Penicillins  Social and Family History  Social History reports that she has never smoked. She has never used smokeless tobacco. She reports that she does not drink alcohol or use illicit drugs.  Family History Family History  Problem Relation Age of Onset  . Stomach cancer Father  . Coronary artery disease Other   Review of Systems   Review of Systems: The patient denies chest pain, reports chronic exertional shortness of breath, with orthopnea, paroxysmal nocturnal dyspnea, pedal edema, palpitations, heart racing, presyncope, syncope. Review of 12 Systems is negative except as described above.  Physical Examination   Vitals: Visit Vitals  . BP 150/78  . Pulse 62  . Ht 157.5 cm (5\' 2" )  . Wt 61.7 kg (136 lb)  . BMI 24.87 kg/m2   Ht:157.5 cm (5\' 2" ) Wt:61.7 kg (136 lb) [id number] surface area is 1.64 meters squared. Body mass index is 24.87 kg/(m^2).  HEENT: Pupils equally reactive to light and accomodation  Neck: Supple without thyromegaly, carotid pulses 2+ Lungs: clear to auscultation bilaterally; no wheezes, rales, rhonchi Heart: Regular rate and rhythm. Grade 2/6 systolic murmur Abdomen: soft nontender, nondistended, with normal bowel sounds Extremities: Trace pedal edema Peripheral Pulses: 2+ in all extremities, 2+ femoral pulses  bilaterally  Assessment   82 y.o. female with  1. Paroxysmal atrial fibrillation (CMS-[name])  2. Coronary artery disease involving native coronary artery of native heart without angina pectoris  3. Chronic systolic CHF (congestive heart failure), NYHA class 1 (CMS-[name])  4. Essential hypertension  5. CKD (chronic kidney disease) stage 4, GFR 15-29 ml/min (CMS-[name])   82 year old female with history of essential hypertension, severe mitral regurgitation referred to Dr. Jenne Pane for evaluation mitral valve repair versus mitral clip procedure. It was felt that the patient was not a good surgical candidate, but could potentially be a candidate for mitral valve clip procedure if symptoms worsen. Patient currently appears to be minimally symptomatic. The patient has essential hypertension, systolic blood pressure mildly elevated on current BP medications.  Plan   1. Continue current medications 2. Continue low-dose Pradaxa for stroke prevention 3. Counseled patient about low-sodium diet 4. DASH diet printed instructions given to the patient 5. Counseled patient about low-cholesterol diet 6. Continue atorvastatin for hyperlipidemia management 7. Low-fat and cholesterol diet printed instructions given to the patient 8. Continue low-dose Pradaxa for stroke prevention 9. Return to clinic for follow-up in 4 months  No orders of the defined types were placed in this encounter.  Return in about 4 months (around 09/17/2015).  Isaias Cowman, MD    Plan of Treatment - as  of this encounter Upcoming Encounters Upcoming Encounters  Date Type Specialty Care Team Description  09/16/2015 Ancillary Orders Lab Harrold Donath, MD  [place]  [facility] [place], [place] [address]  [phone]  [phone] ([phone]    09/16/2015 Office Visit Cardiology Isaias Cowman, MD  [place]  [facility]  [place], [place]  [address]  [phone]  [phone] (Fax)    09/23/2015 Office Visit Internal Medicine Harrold Donath, MD  [place]  [facility] [place], [place] [address]  [phone]  [phone] (Fax)    Visit Diagnoses - in this encounter Diagnosis  Paroxysmal atrial fibrillation (CMS-[name]) - Primary  Atrial fibrillation   Coronary artery disease involving native coronary artery of native heart without angina pectoris  Chronic systolic CHF (congestive heart failure), NYHA class 1 (CMS-[name])  Essential hypertension  CKD (chronic kidney disease) stage 4, GFR 15-29 ml/min (CMS-[name])  Chronic kidney disease, Stage IV (severe)   Document Information Service Providers Document Coverage Dates Mar. 28, 2017 - Mar. 28, 2017 [place] [phone] (Work) [place], [place] [address] Encounter Providers Isaias Cowman MD (Attending) [phone] (Work) [phone] (Fax)  [place] [facility] [place],  [address] Encounter Date Mar. 28, 2017 - Mar. 28, 2017

## 2015-09-03 NOTE — Patient Instructions (Signed)
Your procedure is scheduled on: Friday 09/17/15 Report to Day Surgery. 2ND FLOOR MEDICAL MALL ENTRANCE To find out your arrival time please call [phone] between 1PM - 3PM on Thursday 09/16/15.  Remember: Instructions that are not followed completely may result in serious medical risk, up to and including death, or upon the discretion of your surgeon and anesthesiologist your surgery may need to be rescheduled.    __X__ 1. Do not eat food or drink liquids after midnight. No gum chewing or hard candies.     __X__ 2. No Alcohol for 24 hours before or after surgery.   ____ 3. Bring all medications with you on the day of surgery if instructed.    __X__ 4. Notify your doctor if there is any change in your medical condition     (cold, fever, infections).     Do not wear jewelry, make-up, hairpins, clips or nail polish.  Do not wear lotions, powders, or perfumes.   Do not shave 48 hours prior to surgery. Men may shave face and neck.  Do not bring valuables to the hospital.    [facility] is not responsible for any belongings or valuables.               Contacts, dentures or bridgework may not be worn into surgery.  Leave your suitcase in the car. After surgery it may be brought to your room.  For patients admitted to the hospital, discharge time is determined by your                treatment team.   Patients discharged the day of surgery will not be allowed to drive home.   Please read over the following fact sheets that you were given:   MRSA Information and Surgical Site Infection Prevention   __X__ Take these medicines the morning of surgery with A SIP OF WATER:    1. AMLODIPINE  2. ATORVASTATIN  3. ENALAPRIL  4. LEVOTHYROXINE  5. METOPROLOL  6.  ____ Fleet Enema (as directed)   __X__ Use CHG Soap as directed/SAGE WIPES  ____ Use inhalers on the day of surgery  ____ Stop metformin 2 days prior to surgery    ____ Take 1/2 of usual insulin dose the night before surgery and  none on the morning of surgery.   __X__ Stop Coumadin/Plavix/aspirin on HOLD PRADAXA 3 DAYS FOR SURGERY (CONTACT CARDIOLOGY)  ____ Stop Anti-inflammatories on    ____ Stop supplements until after surgery.    ____ Bring C-Pap to the hospital.

## 2015-09-06 NOTE — Pre-Procedure Instructions (Signed)
DISCUSSED HGB WITH DR Assunta Gambles AND NO REPEAT NECESSARY AM SURGERY

## 2015-09-17 ENCOUNTER — Ambulatory Visit: Admission: RE | Admit: 2015-09-17 | Payer: Medicare Other | Source: Ambulatory Visit | Admitting: Vascular Surgery

## 2015-09-17 ENCOUNTER — Encounter: Admission: RE | Payer: Self-pay | Source: Ambulatory Visit

## 2015-09-17 SURGERY — INSERTION OF ARTERIOVENOUS (AV) GORE-TEX GRAFT ARM
Anesthesia: General | Laterality: Left

## 2015-09-27 ENCOUNTER — Other Ambulatory Visit: Payer: Self-pay | Admitting: Nephrology

## 2015-09-27 ENCOUNTER — Inpatient Hospital Stay: Payer: Medicare Other | Attending: Internal Medicine

## 2015-09-27 DIAGNOSIS — Z79899 Other long term (current) drug therapy: Secondary | ICD-10-CM | POA: Insufficient documentation

## 2015-09-27 DIAGNOSIS — R600 Localized edema: Secondary | ICD-10-CM

## 2015-09-27 DIAGNOSIS — C7A1 Malignant poorly differentiated neuroendocrine tumors: Secondary | ICD-10-CM | POA: Diagnosis not present

## 2015-09-27 MED ORDER — OCTREOTIDE ACETATE 30 MG IM KIT
30.0000 mg | PACK | Freq: Once | INTRAMUSCULAR | Status: AC
  Administered 2015-09-27: 30 mg via INTRAMUSCULAR
  Filled 2015-09-27: qty 1

## 2015-09-30 ENCOUNTER — Ambulatory Visit: Payer: Medicare Other

## 2015-10-01 ENCOUNTER — Ambulatory Visit
Admission: RE | Admit: 2015-10-01 | Discharge: 2015-10-01 | Disposition: A | Discharge status: Home / Self Care | Payer: Medicare Other | Source: Ambulatory Visit | Attending: Nephrology | Admitting: Nephrology

## 2015-10-01 DIAGNOSIS — R2233 Localized swelling, mass and lump, upper limb, bilateral: Secondary | ICD-10-CM | POA: Diagnosis present

## 2015-10-01 DIAGNOSIS — R600 Localized edema: Secondary | ICD-10-CM

## 2015-10-26 ENCOUNTER — Inpatient Hospital Stay: Payer: Medicare Other

## 2015-11-01 ENCOUNTER — Inpatient Hospital Stay: Payer: Medicare Other | Attending: Internal Medicine

## 2015-11-01 ENCOUNTER — Other Ambulatory Visit: Payer: Self-pay | Admitting: Internal Medicine

## 2015-11-01 DIAGNOSIS — C7A098 Malignant carcinoid tumors of other sites: Secondary | ICD-10-CM

## 2015-11-01 DIAGNOSIS — Z79899 Other long term (current) drug therapy: Secondary | ICD-10-CM | POA: Diagnosis not present

## 2015-11-01 DIAGNOSIS — C7A1 Malignant poorly differentiated neuroendocrine tumors: Secondary | ICD-10-CM

## 2015-11-01 MED ORDER — OCTREOTIDE ACETATE 30 MG IM KIT
30.0000 mg | PACK | Freq: Once | INTRAMUSCULAR | Status: AC
  Administered 2015-11-01: 30 mg via INTRAMUSCULAR
  Filled 2015-11-01: qty 1

## 2015-11-25 ENCOUNTER — Other Ambulatory Visit: Payer: Self-pay

## 2015-11-25 DIAGNOSIS — C7A098 Malignant carcinoid tumors of other sites: Secondary | ICD-10-CM

## 2015-11-29 ENCOUNTER — Inpatient Hospital Stay: Payer: Medicare Other

## 2015-11-29 ENCOUNTER — Inpatient Hospital Stay: Payer: Medicare Other | Admitting: Internal Medicine

## 2015-12-02 ENCOUNTER — Inpatient Hospital Stay: Payer: Medicare Other | Admitting: Internal Medicine

## 2015-12-02 ENCOUNTER — Inpatient Hospital Stay: Payer: Medicare Other

## 2015-12-03 ENCOUNTER — Inpatient Hospital Stay: Payer: Medicare Other

## 2015-12-03 ENCOUNTER — Inpatient Hospital Stay (HOSPITAL_BASED_OUTPATIENT_CLINIC_OR_DEPARTMENT_OTHER): Payer: Medicare Other | Admitting: Internal Medicine

## 2015-12-03 ENCOUNTER — Encounter: Payer: Self-pay | Admitting: Internal Medicine

## 2015-12-03 ENCOUNTER — Inpatient Hospital Stay: Payer: Medicare Other | Attending: Internal Medicine

## 2015-12-03 VITALS — BP 146/85 | HR 69 | Temp 96.9°F | Wt 125.0 lb

## 2015-12-03 DIAGNOSIS — I129 Hypertensive chronic kidney disease with stage 1 through stage 4 chronic kidney disease, or unspecified chronic kidney disease: Secondary | ICD-10-CM | POA: Insufficient documentation

## 2015-12-03 DIAGNOSIS — C7A098 Malignant carcinoid tumors of other sites: Secondary | ICD-10-CM

## 2015-12-03 DIAGNOSIS — Z8673 Personal history of transient ischemic attack (TIA), and cerebral infarction without residual deficits: Secondary | ICD-10-CM | POA: Insufficient documentation

## 2015-12-03 DIAGNOSIS — D631 Anemia in chronic kidney disease: Secondary | ICD-10-CM | POA: Diagnosis not present

## 2015-12-03 DIAGNOSIS — E340 Carcinoid syndrome: Secondary | ICD-10-CM | POA: Insufficient documentation

## 2015-12-03 DIAGNOSIS — Z7901 Long term (current) use of anticoagulants: Secondary | ICD-10-CM

## 2015-12-03 DIAGNOSIS — C7A1 Malignant poorly differentiated neuroendocrine tumors: Secondary | ICD-10-CM

## 2015-12-03 DIAGNOSIS — R634 Abnormal weight loss: Secondary | ICD-10-CM

## 2015-12-03 DIAGNOSIS — I4891 Unspecified atrial fibrillation: Secondary | ICD-10-CM | POA: Diagnosis not present

## 2015-12-03 DIAGNOSIS — E039 Hypothyroidism, unspecified: Secondary | ICD-10-CM | POA: Diagnosis not present

## 2015-12-03 DIAGNOSIS — Z79899 Other long term (current) drug therapy: Secondary | ICD-10-CM

## 2015-12-03 DIAGNOSIS — I509 Heart failure, unspecified: Secondary | ICD-10-CM | POA: Insufficient documentation

## 2015-12-03 DIAGNOSIS — N189 Chronic kidney disease, unspecified: Secondary | ICD-10-CM | POA: Insufficient documentation

## 2015-12-03 LAB — COMPREHENSIVE METABOLIC PANEL
ALBUMIN: 3.6 g/dL (ref 3.5–5.0)
ALT: 12 U/L — ABNORMAL LOW (ref 14–54)
ANION GAP: 9 (ref 5–15)
AST: 21 U/L (ref 15–41)
Alkaline Phosphatase: 83 U/L (ref 38–126)
BILIRUBIN TOTAL: 0.6 mg/dL (ref 0.3–1.2)
BUN: 22 mg/dL — ABNORMAL HIGH (ref 6–20)
CO2: 27 mmol/L (ref 22–32)
Calcium: 8.9 mg/dL (ref 8.9–10.3)
Chloride: 98 mmol/L — ABNORMAL LOW (ref 101–111)
Creatinine, Ser: 2.36 mg/dL — ABNORMAL HIGH (ref 0.44–1.00)
GFR calc non Af Amer: 17 mL/min — ABNORMAL LOW (ref >60)
GFR, EST AFRICAN AMERICAN: 20 mL/min — AB (ref >60)
GLUCOSE: 111 mg/dL — AB (ref 65–99)
POTASSIUM: 3.7 mmol/L (ref 3.5–5.1)
Sodium: 134 mmol/L — ABNORMAL LOW (ref 135–145)
TOTAL PROTEIN: 6.3 g/dL — AB (ref 6.5–8.1)

## 2015-12-03 LAB — CBC WITH DIFFERENTIAL/PLATELET
BASOS PCT: 3 %
Basophils Absolute: 0.1 10*3/uL (ref 0–0.1)
EOS ABS: 0.1 10*3/uL (ref 0–0.7)
Eosinophils Relative: 3 %
HEMATOCRIT: 36.6 % (ref 35.0–47.0)
Hemoglobin: 11.8 g/dL — ABNORMAL LOW (ref 12.0–16.0)
Lymphocytes Relative: 26 %
Lymphs Abs: 1.1 10*3/uL (ref 1.0–3.6)
MCH: 25.7 pg — AB (ref 26.0–34.0)
MCHC: 32.4 g/dL (ref 32.0–36.0)
MCV: 79.4 fL — ABNORMAL LOW (ref 80.0–100.0)
MONO ABS: 0.3 10*3/uL (ref 0.2–0.9)
MONOS PCT: 8 %
NEUTROS ABS: 2.6 10*3/uL (ref 1.4–6.5)
Neutrophils Relative %: 60 %
Platelets: 159 10*3/uL (ref 150–440)
RBC: 4.6 MIL/uL (ref 3.80–5.20)
RDW: 21.3 % — AB (ref 11.5–14.5)
WBC: 4.3 10*3/uL (ref 3.6–11.0)

## 2015-12-03 MED ORDER — OCTREOTIDE ACETATE 30 MG IM KIT
30.0000 mg | PACK | Freq: Once | INTRAMUSCULAR | Status: AC
  Administered 2015-12-03: 30 mg via INTRAMUSCULAR
  Filled 2015-12-03: qty 1

## 2015-12-03 NOTE — Progress Notes (Signed)
[facility] OFFICE PROGRESS NOTE  Patient Care Team: Kirk Ruths, MD as PCP - General (Internal Medicine)  No matching staging information was found for the patient.   Oncology History   1. Ovarian cystic mass underwent robotic bilateral salpingo-oophorectomy in April of 2011. Neuroendocrine tumor, poorly differentiated carcinoid 2. Octreotide scan revealed mets to retro peritoneal lymph node. Patient underwent lymph node dissection in July of 2011. 3. Patient has been started on Sandostatin LAR from September of 2012 because of symptoms of carcinoid syndrome.     Malignant poorly differentiated neuroendocrine carcinoma ([place])   08/12/2014 Initial Diagnosis    Malignant poorly differentiated neuroendocrine carcinoma [facility])        This is my first interaction with the patient as patient's primary oncologist has been Dr.Choksi. I reviewed the patient's prior charts/pertinent labs/imaging in detail; findings are summarized above.     INTERVAL HISTORY:  [name] 82 y.o.  female pleasant patient above history of Carcinoid of the ovary is here for follow-up; patient is currently on Sandostatin.  Denies any abdominal discomfort. Denies any nausea vomiting abdominal pain. Denies any significant diarrhea. No chest pain or shortness of breath or cough. No swelling in the legs. Patient complains of weight loss. Poor appetite.  REVIEW OF SYSTEMS:  A complete 10 point review of system is done which is negative except mentioned above/history of present illness.   PAST MEDICAL HISTORY :  Past Medical History:  Diagnosis Date  . Atrial fibrillation ([place])   . Cancer [facility])    Carcinoid Tumor  . CHF (congestive heart failure) ([place])   . Chronic kidney disease    CKD with last known creatinine fo 2.4 in Dec 2015  . Dysrhythmia    Atrial Fibrillation  . Hypertension   . Hypothyroidism   . Malignant poorly differentiated neuroendocrine carcinoma ([place]) 08/12/2014   . Stroke ([place])    No residual neurological deficits    PAST SURGICAL HISTORY :   Past Surgical History:  Procedure Laterality Date  . ABDOMINAL HYSTERECTOMY    . BACK SURGERY    . EYE SURGERY    . JOINT REPLACEMENT     Knee replacement surgery- bilateral  . PERIPHERAL VASCULAR CATHETERIZATION N/A 07/27/2015   Procedure: Dialysis/Perma Catheter Insertion;  Surgeon: Katha Cabal, MD;  Location: [place] [facility];  Service: Cardiovascular;  Laterality: N/A;  . SHOULDER SURGERY Bilateral     FAMILY HISTORY :   Family History  Problem Relation Age of Onset  . CAD Father   . Stroke Father   . Kidney failure Mother     SOCIAL HISTORY:   Social History  Substance Use Topics  . Smoking status: Never Smoker  . Smokeless tobacco: Never Used  . Alcohol use No    ALLERGIES:  is allergic to penicillins.  MEDICATIONS:  Current Outpatient Prescriptions  Medication Sig Dispense Refill  . amLODipine (NORVASC) 5 MG tablet Take 5 mg by mouth daily.     Marland Kitchen atorvastatin (LIPITOR) 20 MG tablet Take 20 mg by mouth daily.     . calcitRIOL (ROCALTROL) 0.25 MCG capsule Take 0.25 mcg by mouth daily. Reported on 07/27/2015    . citalopram (CELEXA) 20 MG tablet Take by mouth.    . dabigatran (PRADAXA) 75 MG CAPS capsule Take 75 mg by mouth 2 (two) times daily. Reported on 07/27/2015    . enalapril (VASOTEC) 5 MG tablet Take 5 mg by mouth daily.    Marland Kitchen levothyroxine (SYNTHROID,  LEVOTHROID) 88 MCG tablet     . metoprolol (LOPRESSOR) 50 MG tablet Take 1 tablet (50 mg total) by mouth 2 (two) times daily. (Patient taking differently: Take 50 mg by mouth daily. ) 60 tablet 0  . predniSONE (DELTASONE) 10 MG tablet     . sodium chloride 1 g tablet Take 1 g by mouth 3 (three) times daily.  12  . tolterodine (DETROL LA) 4 MG 24 hr capsule Take 4 mg by mouth daily.    Marland Kitchen torsemide (DEMADEX) 10 MG tablet Take 20 mg by mouth daily.     . traMADol (ULTRAM) 50 MG tablet Take 50 mg by mouth every 6 (six) hours  as needed for moderate pain.     Marland Kitchen warfarin (COUMADIN) 5 MG tablet Take by mouth.     No current facility-administered medications for this visit.     PHYSICAL EXAMINATION: ECOG PERFORMANCE STATUS: 0 - Asymptomatic  BP (!) 146/85 (BP Location: Right Arm, Patient Position: Sitting)   Pulse 69   Temp (!) 96.9 F (36.1 C) (Tympanic)   Wt 125 lb (56.7 kg)   BMI 22.14 kg/m   Filed Weights   12/03/15 1347  Weight: 125 lb (56.7 kg)    GENERAL: Frail-appearing thin built Caucasian female patient is walking herself; Alert, no distress and comfortable.   alone EYES: no pallor or icterus OROPHARYNX: no thrush or ulceration; good dentition  NECK: supple, no masses felt LYMPH:  no palpable lymphadenopathy in the cervical, axillary or inguinal regions LUNGS: clear to auscultation and  No wheeze or crackles HEART/CVS: regular rate & rhythm and no murmurs; No lower extremity edema ABDOMEN:abdomen soft, non-tender and normal bowel sounds Musculoskeletal:no cyanosis of digits and no clubbing  PSYCH: alert & oriented x 3 with fluent speech NEURO: no focal motor/sensory deficits SKIN:  no rashes or significant lesions  LABORATORY DATA:  I have reviewed the data as listed    Component Value Date/Time   NA 134 (L) 12/03/2015 1317   NA 135 05/20/2014 1402   K 3.7 12/03/2015 1317   K 4.5 05/20/2014 1402   CL 98 (L) 12/03/2015 1317   CL 106 05/20/2014 1402   CO2 27 12/03/2015 1317   CO2 22 05/20/2014 1402   GLUCOSE 111 (H) 12/03/2015 1317   GLUCOSE 91 05/20/2014 1402   BUN 22 (H) 12/03/2015 1317   BUN 61 (H) 05/20/2014 1402   CREATININE 2.36 (H) 12/03/2015 1317   CREATININE 2.72 (H) 05/20/2014 1402   CALCIUM 8.9 12/03/2015 1317   CALCIUM 8.7 (L) 05/20/2014 1402   PROT 6.3 (L) 12/03/2015 1317   PROT 7.0 05/20/2014 1402   ALBUMIN 3.6 12/03/2015 1317   ALBUMIN 4.1 05/20/2014 1402   AST 21 12/03/2015 1317   AST 23 05/20/2014 1402   ALT 12 (L) 12/03/2015 1317   ALT 12 (L) 05/20/2014  1402   ALKPHOS 83 12/03/2015 1317   ALKPHOS 90 05/20/2014 1402   BILITOT 0.6 12/03/2015 1317   BILITOT 0.5 05/20/2014 1402   GFRNONAA 17 (L) 12/03/2015 1317   GFRNONAA 15 (L) 05/20/2014 1402   GFRAA 20 (L) 12/03/2015 1317   GFRAA 18 (L) 05/20/2014 1402    No results found for: SPEP, UPEP  Lab Results  Component Value Date   WBC 4.3 12/03/2015   NEUTROABS 2.6 12/03/2015   HGB 11.8 (L) 12/03/2015   HCT 36.6 12/03/2015   MCV 79.4 (L) 12/03/2015   PLT 159 12/03/2015      Chemistry  Component Value Date/Time   NA 134 (L) 12/03/2015 1317   NA 135 05/20/2014 1402   K 3.7 12/03/2015 1317   K 4.5 05/20/2014 1402   CL 98 (L) 12/03/2015 1317   CL 106 05/20/2014 1402   CO2 27 12/03/2015 1317   CO2 22 05/20/2014 1402   BUN 22 (H) 12/03/2015 1317   BUN 61 (H) 05/20/2014 1402   CREATININE 2.36 (H) 12/03/2015 1317   CREATININE 2.72 (H) 05/20/2014 1402      Component Value Date/Time   CALCIUM 8.9 12/03/2015 1317   CALCIUM 8.7 (L) 05/20/2014 1402   ALKPHOS 83 12/03/2015 1317   ALKPHOS 90 05/20/2014 1402   AST 21 12/03/2015 1317   AST 23 05/20/2014 1402   ALT 12 (L) 12/03/2015 1317   ALT 12 (L) 05/20/2014 1402   BILITOT 0.6 12/03/2015 1317   BILITOT 0.5 05/20/2014 1402       RADIOGRAPHIC STUDIES: I have personally reviewed the radiological images as listed and agreed with the findings in the report. No results found.   ASSESSMENT & PLAN:  Carcinoid tumor of ovary, malignant ([place]) # METASTATIC CARCINOID of ovary; on sandostatin q monthly. Clinically asymptomatic from carcinoid syndrome. No evidence of any progression clinically.   # ANEMIA- /CKD- hb 11.8/improved.   # Weight loss- about 7 pounds; Boost/ensure  # follow up in 3 months/ labs.chromgranin A/ iron studies; ferrtin.  We will review of patient's old pathology reports.   Orders Placed This Encounter  Procedures  . CBC with Differential    Standing Status:   Future    Standing Expiration Date:    12/02/2016  . Comprehensive metabolic panel    Standing Status:   Future    Standing Expiration Date:   12/02/2016  . Chromogranin A    Standing Status:   Future    Standing Expiration Date:   12/02/2016   All questions were answered. The patient knows to call the clinic with any problems, questions or concerns.      Cammie Sickle, MD 12/04/2015 12:24 PM

## 2015-12-03 NOTE — Assessment & Plan Note (Deleted)
#    Ovarian cystic mass underwent robotic bilateral salpingo-oophorectomy in April of 2011. Neuroendocrine tumor, ?? poorly differentiated carcinoid  # Octreotide scan revealed mets to retro peritoneal lymph node; Patient underwent lymph node dissection in July of 2011.  #  Patient has been started on Sandostatin LAR from September of 2012 because of symptoms of carcinoid syndrome

## 2015-12-03 NOTE — Assessment & Plan Note (Signed)
#   METASTATIC CARCINOID of ovary; on sandostatin q monthly. Clinically asymptomatic from carcinoid syndrome. No evidence of any progression clinically.   # ANEMIA- /CKD- hb 11.8/improved.   # Weight loss- about 7 pounds; Boost/ensure  # follow up in 3 months/ labs.chromgranin A/ iron studies; ferrtin.

## 2015-12-20 ENCOUNTER — Ambulatory Visit (INDEPENDENT_AMBULATORY_CARE_PROVIDER_SITE_OTHER): Payer: Self-pay | Admitting: Vascular Surgery

## 2015-12-23 ENCOUNTER — Ambulatory Visit (INDEPENDENT_AMBULATORY_CARE_PROVIDER_SITE_OTHER): Payer: Self-pay | Admitting: Vascular Surgery

## 2015-12-28 ENCOUNTER — Telehealth: Payer: Self-pay | Admitting: *Deleted

## 2015-12-28 NOTE — Telephone Encounter (Signed)
md made ware that I have searched the entire chart. Appears that [place].  Nothing is scanned in on chart in epic.  She is coming on Thursday this with for her sandostatin injection. I left her a vm asking her to sign a consent form when she comes for the outside records.

## 2015-12-28 NOTE — Telephone Encounter (Signed)
-----   Message from Cammie Sickle, MD sent at 12/04/2015 12:25 PM EDT ----- Things don't seem to add up to me.. I would like to review old records Please get me patient's old/ ovary pathology report/surgery report.   thx

## 2015-12-30 ENCOUNTER — Ambulatory Visit (INDEPENDENT_AMBULATORY_CARE_PROVIDER_SITE_OTHER): Payer: Medicare Other | Admitting: Vascular Surgery

## 2015-12-30 ENCOUNTER — Encounter (INDEPENDENT_AMBULATORY_CARE_PROVIDER_SITE_OTHER): Payer: Self-pay | Admitting: Vascular Surgery

## 2015-12-30 ENCOUNTER — Inpatient Hospital Stay: Payer: Medicare Other | Attending: Internal Medicine

## 2015-12-30 VITALS — BP 153/81 | HR 81 | Resp 16 | Ht 62.0 in | Wt 122.0 lb

## 2015-12-30 DIAGNOSIS — I2510 Atherosclerotic heart disease of native coronary artery without angina pectoris: Secondary | ICD-10-CM

## 2015-12-30 DIAGNOSIS — I5022 Chronic systolic (congestive) heart failure: Secondary | ICD-10-CM

## 2015-12-30 DIAGNOSIS — N186 End stage renal disease: Secondary | ICD-10-CM

## 2015-12-30 DIAGNOSIS — C7A098 Malignant carcinoid tumors of other sites: Secondary | ICD-10-CM | POA: Insufficient documentation

## 2015-12-30 DIAGNOSIS — Z79899 Other long term (current) drug therapy: Secondary | ICD-10-CM | POA: Insufficient documentation

## 2015-12-30 DIAGNOSIS — I10 Essential (primary) hypertension: Secondary | ICD-10-CM | POA: Diagnosis not present

## 2015-12-30 DIAGNOSIS — Z992 Dependence on renal dialysis: Secondary | ICD-10-CM | POA: Diagnosis not present

## 2015-12-30 DIAGNOSIS — C7A1 Malignant poorly differentiated neuroendocrine tumors: Secondary | ICD-10-CM

## 2015-12-30 MED ORDER — OCTREOTIDE ACETATE 30 MG IM KIT
30.0000 mg | PACK | Freq: Once | INTRAMUSCULAR | Status: AC
  Administered 2015-12-30: 30 mg via INTRAMUSCULAR
  Filled 2015-12-30: qty 1

## 2015-12-30 NOTE — Progress Notes (Signed)
[facility] Admission History & Physical  MRN : [id number]  Kerry Walsh is a 82 y.o. ([birth year]) female who presents with chief complaint of  Chief Complaint  Patient presents with  . Follow-up  .  History of Present Illness: The patient is seen for follow up evaluation of dialysis access.   Current access is via a catheter which is functioning well. There have been no episodes of catheter infection.  The patient denies amaurosis fugax or recent TIA symptoms. There are no recent neurological changes noted. The patient denies claudication symptoms or rest pain symptoms. The patient denies history of DVT, PE or superficial thrombophlebitis. The patient denies recent episodes of angina or shortness of breath.  Previous duplex ultrasound of the right arm arterial system shows patent arteries with uniform velocities. No focal hemodynamically significant stenosis. Previous duplex ultrasound of the right arm venous system shows patent veins. The cephalic and the basilic veins are not suitable for fistula creation because of small size  Current Meds  Medication Sig  . amLODipine (NORVASC) 5 MG tablet Take 5 mg by mouth daily.   Marland Kitchen atorvastatin (LIPITOR) 20 MG tablet Take 20 mg by mouth daily.   . calcitRIOL (ROCALTROL) 0.25 MCG capsule Take 0.25 mcg by mouth daily. Reported on 07/27/2015  . citalopram (CELEXA) 20 MG tablet Take by mouth.  . dabigatran (PRADAXA) 75 MG CAPS capsule Take 75 mg by mouth 2 (two) times daily. Reported on 07/27/2015  . enalapril (VASOTEC) 5 MG tablet Take 5 mg by mouth daily.  Marland Kitchen levothyroxine (SYNTHROID, LEVOTHROID) 88 MCG tablet   . metoprolol (LOPRESSOR) 50 MG tablet Take 1 tablet (50 mg total) by mouth 2 (two) times daily. (Patient taking differently: Take 50 mg by mouth daily. )  . predniSONE (DELTASONE) 10 MG tablet   . sodium chloride 1 g tablet Take 1 g by mouth 3 (three) times daily.  Marland Kitchen tolterodine (DETROL LA) 4 MG 24 hr  capsule Take 4 mg by mouth daily.  Marland Kitchen torsemide (DEMADEX) 10 MG tablet Take 20 mg by mouth daily.   . traMADol (ULTRAM) 50 MG tablet Take 50 mg by mouth every 6 (six) hours as needed for moderate pain.   Marland Kitchen warfarin (COUMADIN) 5 MG tablet Take by mouth.    Past Medical History:  Diagnosis Date  . Atrial fibrillation ([place])   . Cancer [facility])    Carcinoid Tumor  . CHF (congestive heart failure) ([place])   . Chronic kidney disease    CKD with last known creatinine fo 2.4 in Dec 2015  . Dysrhythmia    Atrial Fibrillation  . Hypertension   . Hypothyroidism   . Malignant poorly differentiated neuroendocrine carcinoma ([place]) 08/12/2014  . Stroke [facility])    No residual neurological deficits    Past Surgical History:  Procedure Laterality Date  . ABDOMINAL HYSTERECTOMY    . BACK SURGERY    . EYE SURGERY    . JOINT REPLACEMENT     Knee replacement surgery- bilateral  . PERIPHERAL VASCULAR CATHETERIZATION N/A 07/27/2015   Procedure: Dialysis/Perma Catheter Insertion;  Surgeon: Katha Cabal, MD;  Location: [place] [facility];  Service: Cardiovascular;  Laterality: N/A;  . SHOULDER SURGERY Bilateral     Social History Social History  Substance Use Topics  . Smoking status: Never Smoker  . Smokeless tobacco: Never Used  . Alcohol use No    Family History Family History  Problem Relation Age of Onset  . CAD Father   .  Stroke Father   . Kidney failure Mother   No family history of bleeding/clotting disorders, porphyria or autoimmune disease   Allergies  Allergen Reactions  . Penicillins Rash    Has patient had a PCN reaction causing immediate rash, facial/tongue/throat swelling, SOB or lightheadedness with hypotension: Yes Has patient had a PCN reaction causing severe rash involving mucus membranes or skin necrosis: No Has patient had a PCN reaction that required hospitalization No Has patient had a PCN reaction occurring within the last 10 years: No If all of the above answers  are "NO", then may proceed with Cephalosporin use.      REVIEW OF SYSTEMS (Negative unless checked)  Constitutional: [] Weight loss  [] Fever  [] Chills Cardiac: [] Chest pain   [] Chest pressure   [] Palpitations   [x] Shortness of breath when laying flat   [] Shortness of breath with exertion. Vascular:  [] Pain in legs with walking   [] Pain in legs at rest  [] History of DVT   [] Phlebitis   [x] Swelling in legs   [] Varicose veins   [] Non-healing ulcers Pulmonary:   [] Uses home oxygen   [] Productive cough   [] Hemoptysis   [] Wheeze  [] COPD   [] Asthma Neurologic:  [] Dizziness   [] Seizures   [] History of stroke   [] History of TIA  [] Aphasia   [] Vissual changes   [] Weakness or numbness in arm   [] Weakness or numbness in leg Musculoskeletal:   [] Joint swelling   [] Joint pain   [] Low back pain Hematologic:  [] Easy bruising  [] Easy bleeding   [] Hypercoagulable state   [] Anemic Gastrointestinal:  [] Diarrhea   [] Vomiting  [] Gastroesophageal reflux/heartburn   [] Difficulty swallowing. Genitourinary:  [x] Chronic kidney disease   [] Difficult urination  [] Frequent urination   [] Blood in urine Skin:  [] Rashes   [] Ulcers  Psychological:  [] History of anxiety   []  History of major depression.  Physical Examination  Vitals:   12/30/15 1606  BP: (!) 153/81  Pulse: 81  Resp: 16  Weight: 122 lb (55.3 kg)  Height: 5\' 2"  (1.575 m)   Body mass index is 22.31 kg/m. Gen: WD/WN, NAD Head: [place]/AT, No temporalis wasting.  Ear/Nose/Throat: Hearing grossly intact, nares w/o erythema or drainage, poor dentition Eyes: PER, EOMI, sclera nonicteric.  Neck: Supple, no masses.  No bruit or JVD.  Pulmonary:  Good air movement, clear to auscultation bilaterally, no use of accessory muscles.  Cardiac: RRR, normal S1, S2, no Murmurs. Vascular: catheter CD&I,  3+ edema both legs Vessel Right Left  Radial Palpable Palpable  Ulnar Palpable Palpable  Brachial Palpable Palpable  Carotid Palpable Palpable  Gastrointestinal:  soft, non-distended. No guarding/no peritoneal signs.  Musculoskeletal: M/S 4/5 throughout.  No deformity or atrophy.  Neurologic: CN 2-12 intact. Pain and light touch intact in extremities.  Symmetrical.  Speech is fluent. Motor exam as listed above. Psychiatric: Judgment intact, Mood & affect appropriate for pt's clinical situation. Dermatologic: No rashes or ulcers noted.  No changes consistent with cellulitis. Lymph : No Cervical lymphadenopathy, no lichenification or skin changes of chronic lymphedema.  CBC Lab Results  Component Value Date   WBC 4.3 12/03/2015   HGB 11.8 (L) 12/03/2015   HCT 36.6 12/03/2015   MCV 79.4 (L) 12/03/2015   PLT 159 12/03/2015    BMET    Component Value Date/Time   NA 134 (L) 12/03/2015 1317   NA 135 05/20/2014 1402   K 3.7 12/03/2015 1317   K 4.5 05/20/2014 1402   CL 98 (L) 12/03/2015 1317   CL 106 05/20/2014  1402   CO2 27 12/03/2015 1317   CO2 22 05/20/2014 1402   GLUCOSE 111 (H) 12/03/2015 1317   GLUCOSE 91 05/20/2014 1402   BUN 22 (H) 12/03/2015 1317   BUN 61 (H) 05/20/2014 1402   CREATININE 2.36 (H) 12/03/2015 1317   CREATININE 2.72 (H) 05/20/2014 1402   CALCIUM 8.9 12/03/2015 1317   CALCIUM 8.7 (L) 05/20/2014 1402   GFRNONAA 17 (L) 12/03/2015 1317   GFRNONAA 15 (L) 05/20/2014 1402   GFRAA 20 (L) 12/03/2015 1317   GFRAA 18 (L) 05/20/2014 1402   CrCl cannot be calculated (Patient's most recent lab result is older than the maximum 21 days allowed.).  COAG Lab Results  Component Value Date   INR 0.98 09/03/2015   INR 1.32 07/27/2015   INR 1.3 11/27/2013    Radiology No results found.  Assessment/Plan 1. CKD (chronic kidney disease) stage V requiring chronic dialysis ([place]) Recommend:  At this time the patient does not have appropriate extremity access for dialysis  Patient should have a AV graft created.  The risks, benefits and alternative therapies were reviewed in detail with the patient.  All questions were answered.   The patient agrees to proceed with surgery.   Will schedule left arm brachial axillary graft  2. Arteriosclerosis of coronary artery Continue cardiac medications as already ordered and reviewed, no changes at this time.  Nitrates PRN  3. Essential hypertension Continue antihypertensive medications as already ordered and reviewed, no changes at this time.    4. Chronic systolic heart failure ([place]) Continue diuretic therapy as ordered and reviewed no changes at this time    [name], MD  12/30/2015 9:37 PM

## 2015-12-30 NOTE — Progress Notes (Signed)
rm 3

## 2015-12-31 ENCOUNTER — Encounter (INDEPENDENT_AMBULATORY_CARE_PROVIDER_SITE_OTHER): Payer: Self-pay

## 2016-01-03 ENCOUNTER — Other Ambulatory Visit (INDEPENDENT_AMBULATORY_CARE_PROVIDER_SITE_OTHER): Payer: Self-pay | Admitting: Vascular Surgery

## 2016-01-03 ENCOUNTER — Ambulatory Visit: Payer: Medicare Other

## 2016-01-11 ENCOUNTER — Encounter
Admission: RE | Admit: 2016-01-11 | Discharge: 2016-01-11 | Disposition: A | Discharge status: Home / Self Care | Payer: Medicare Other | Source: Ambulatory Visit | Attending: Vascular Surgery | Admitting: Vascular Surgery

## 2016-01-11 DIAGNOSIS — Z01812 Encounter for preprocedural laboratory examination: Secondary | ICD-10-CM | POA: Diagnosis not present

## 2016-01-11 DIAGNOSIS — Z0183 Encounter for blood typing: Secondary | ICD-10-CM | POA: Insufficient documentation

## 2016-01-11 DIAGNOSIS — N186 End stage renal disease: Secondary | ICD-10-CM | POA: Diagnosis not present

## 2016-01-11 HISTORY — DX: Anemia, unspecified: D64.9

## 2016-01-11 HISTORY — DX: Age-related osteoporosis without current pathological fracture: M81.0

## 2016-01-11 HISTORY — DX: Dependence on renal dialysis: Z99.2

## 2016-01-11 HISTORY — DX: Anxiety disorder, unspecified: F41.9

## 2016-01-11 HISTORY — DX: Major depressive disorder, single episode, unspecified: F32.9

## 2016-01-11 HISTORY — DX: Unspecified osteoarthritis, unspecified site: M19.90

## 2016-01-11 HISTORY — DX: Unspecified dementia, unspecified severity, without behavioral disturbance, psychotic disturbance, mood disturbance, and anxiety: F03.90

## 2016-01-11 HISTORY — DX: Depression, unspecified: F32.A

## 2016-01-11 HISTORY — DX: Nonrheumatic mitral (valve) insufficiency: I34.0

## 2016-01-11 HISTORY — DX: Atherosclerotic heart disease of native coronary artery without angina pectoris: I25.10

## 2016-01-11 LAB — BASIC METABOLIC PANEL
ANION GAP: 9 (ref 5–15)
BUN: 14 mg/dL (ref 6–20)
CO2: 27 mmol/L (ref 22–32)
Calcium: 9.1 mg/dL (ref 8.9–10.3)
Chloride: 102 mmol/L (ref 101–111)
Creatinine, Ser: 2.37 mg/dL — ABNORMAL HIGH (ref 0.44–1.00)
GFR calc Af Amer: 20 mL/min — ABNORMAL LOW (ref >60)
GFR, EST NON AFRICAN AMERICAN: 17 mL/min — AB (ref >60)
GLUCOSE: 110 mg/dL — AB (ref 65–99)
POTASSIUM: 3.5 mmol/L (ref 3.5–5.1)
Sodium: 138 mmol/L (ref 135–145)

## 2016-01-11 LAB — CBC WITH DIFFERENTIAL/PLATELET
BASOS ABS: 0.1 10*3/uL (ref 0–0.1)
Basophils Relative: 2 %
EOS PCT: 7 %
Eosinophils Absolute: 0.3 10*3/uL (ref 0–0.7)
HEMATOCRIT: 34.7 % — AB (ref 35.0–47.0)
Hemoglobin: 11.4 g/dL — ABNORMAL LOW (ref 12.0–16.0)
LYMPHS PCT: 19 %
Lymphs Abs: 1 10*3/uL (ref 1.0–3.6)
MCH: 27.2 pg (ref 26.0–34.0)
MCHC: 32.9 g/dL (ref 32.0–36.0)
MCV: 82.6 fL (ref 80.0–100.0)
MONO ABS: 0.4 10*3/uL (ref 0.2–0.9)
MONOS PCT: 9 %
NEUTROS ABS: 3.3 10*3/uL (ref 1.4–6.5)
Neutrophils Relative %: 63 %
PLATELETS: 177 10*3/uL (ref 150–440)
RBC: 4.2 MIL/uL (ref 3.80–5.20)
RDW: 22.6 % — AB (ref 11.5–14.5)
WBC: 5.2 10*3/uL (ref 3.6–11.0)

## 2016-01-11 LAB — TYPE AND SCREEN
ABO/RH(D): O POS
Antibody Screen: NEGATIVE

## 2016-01-11 LAB — PROTIME-INR
INR: 2.21
Prothrombin Time: 24.9 seconds — ABNORMAL HIGH (ref 11.4–15.2)

## 2016-01-11 LAB — SURGICAL PCR SCREEN
MRSA, PCR: NEGATIVE
STAPHYLOCOCCUS AUREUS: NEGATIVE

## 2016-01-11 LAB — APTT: APTT: 41 s — AB (ref 24–36)

## 2016-01-11 NOTE — Pre-Procedure Instructions (Signed)
Medical clearance request called and faxed to Dr. Delana Meyer office (spoke to April)

## 2016-01-11 NOTE — Pre-Procedure Instructions (Signed)
Dr.Penwarden made aware of patient medical history and requested medical clearance.

## 2016-01-11 NOTE — Patient Instructions (Signed)
  Your procedure is scheduled [id number] 29, 2017 (Wednesday)_ Report to Same Day Surgery 2nd floor [facility] To find out your arrival time please call [phone] between 1PM - 3PM on January 18, 2016 (Tuesday)  Remember: Instructions that are not followed completely may result in serious medical risk, up to and including death, or upon the discretion of your surgeon and anesthesiologist your surgery may need to be rescheduled.    _x___ 1. Do not eat food or drink liquids after midnight. No gum chewing or hard candies.     __x__ 2. No Alcohol for 24 hours before or after surgery.   __x__3. No Smoking for 24 prior to surgery.   ____  4. Bring all medications with you on the day of surgery if instructed.    __x__ 5. Notify your doctor if there is any change in your medical condition     (cold, fever, infections).     Do not wear jewelry, make-up, hairpins, clips or nail polish.  Do not wear lotions, powders, or perfumes. You may wear deodorant.  Do not shave 48 hours prior to surgery. Men may shave face and neck.  Do not bring valuables to the hospital.    [facility] is not responsible for any belongings or valuables.               Contacts, dentures or bridgework may not be worn into surgery.  Leave your suitcase in the car. After surgery it may be brought to your room.  For patients admitted to the hospital, discharge time is determined by your treatment team.   Patients discharged the day of surgery will not be allowed to drive home.    Please read over the following fact sheets that you were given:   [facility] Preparing for Surgery and or MRSA Information   __ Take these medicines the morning of surgery with A SIP OF WATER:    1.   2.  3.  4.  5.  6.  ____Fleets enema or Magnesium Citrate as directed.   __ Use CHG Soap or sage wipes as directed on instruction sheet   ____ Use inhalers on the day of surgery and bring to hospital day of surgery  ____ Stop  metformin 2 days prior to surgery    ____ Take 1/2 of usual insulin dose the night before surgery and none on the morning of surgery            _x___ Stop aspirin or coumadin, or plavix (Stop warfarin on November 25, Saturday)  x__ Stop Anti-inflammatories such as Advil, Aleve, Ibuprofen, Motrin, Naproxen,          Naprosyn, Goodies powders or aspirin products. Ok to take Tylenol.   ____ Stop supplements until after surgery.    ____ Bring C-Pap to the hospital.

## 2016-01-11 NOTE — Pre-Procedure Instructions (Signed)
Dr. Ouida Sills office notified of medical clearance request, faxed and called (spoke to [facility]).

## 2016-01-12 NOTE — Pre-Procedure Instructions (Signed)
CBC, Met B, and PT/PTT sent to Dr. Delana Meyer and Anesthesia for review.

## 2016-01-18 MED ORDER — CLINDAMYCIN PHOSPHATE 300 MG/50ML IV SOLN
300.0000 mg | Freq: Once | INTRAVENOUS | Status: AC
  Administered 2016-01-19: 300 mg via INTRAVENOUS

## 2016-01-19 ENCOUNTER — Ambulatory Visit: Payer: Medicare Other | Admitting: Anesthesiology

## 2016-01-19 ENCOUNTER — Encounter
Admission: RE | Disposition: A | Discharge status: Home / Self Care | Payer: Self-pay | Source: Ambulatory Visit | Attending: Vascular Surgery

## 2016-01-19 ENCOUNTER — Encounter: Payer: Self-pay | Admitting: *Deleted

## 2016-01-19 ENCOUNTER — Ambulatory Visit
Admission: RE | Admit: 2016-01-19 | Discharge: 2016-01-19 | Disposition: A | Discharge status: Home / Self Care | Payer: Medicare Other | Source: Ambulatory Visit | Attending: Vascular Surgery | Admitting: Vascular Surgery

## 2016-01-19 DIAGNOSIS — N186 End stage renal disease: Secondary | ICD-10-CM | POA: Insufficient documentation

## 2016-01-19 DIAGNOSIS — F419 Anxiety disorder, unspecified: Secondary | ICD-10-CM | POA: Diagnosis not present

## 2016-01-19 DIAGNOSIS — E039 Hypothyroidism, unspecified: Secondary | ICD-10-CM | POA: Insufficient documentation

## 2016-01-19 DIAGNOSIS — I4891 Unspecified atrial fibrillation: Secondary | ICD-10-CM | POA: Insufficient documentation

## 2016-01-19 DIAGNOSIS — I2510 Atherosclerotic heart disease of native coronary artery without angina pectoris: Secondary | ICD-10-CM | POA: Diagnosis not present

## 2016-01-19 DIAGNOSIS — I5022 Chronic systolic (congestive) heart failure: Secondary | ICD-10-CM | POA: Insufficient documentation

## 2016-01-19 DIAGNOSIS — Z8673 Personal history of transient ischemic attack (TIA), and cerebral infarction without residual deficits: Secondary | ICD-10-CM | POA: Diagnosis not present

## 2016-01-19 DIAGNOSIS — I120 Hypertensive chronic kidney disease with stage 5 chronic kidney disease or end stage renal disease: Secondary | ICD-10-CM | POA: Diagnosis present

## 2016-01-19 DIAGNOSIS — F329 Major depressive disorder, single episode, unspecified: Secondary | ICD-10-CM | POA: Diagnosis not present

## 2016-01-19 DIAGNOSIS — I132 Hypertensive heart and chronic kidney disease with heart failure and with stage 5 chronic kidney disease, or end stage renal disease: Secondary | ICD-10-CM | POA: Insufficient documentation

## 2016-01-19 DIAGNOSIS — Z841 Family history of disorders of kidney and ureter: Secondary | ICD-10-CM | POA: Insufficient documentation

## 2016-01-19 HISTORY — PX: AV FISTULA PLACEMENT: SHX1204

## 2016-01-19 LAB — POTASSIUM: POTASSIUM: 3.4 mmol/L — AB (ref 3.5–5.1)

## 2016-01-19 LAB — PROTIME-INR
INR: 1.28
Prothrombin Time: 16.1 seconds — ABNORMAL HIGH (ref 11.4–15.2)

## 2016-01-19 SURGERY — INSERTION OF ARTERIOVENOUS (AV) GORE-TEX GRAFT ARM
Anesthesia: General | Laterality: Left | Wound class: Clean

## 2016-01-19 MED ORDER — DEXAMETHASONE SODIUM PHOSPHATE 10 MG/ML IJ SOLN
INTRAMUSCULAR | Status: DC | PRN
  Administered 2016-01-19: 4 mg via INTRAVENOUS

## 2016-01-19 MED ORDER — HEPARIN SODIUM (PORCINE) 5000 UNIT/ML IJ SOLN
INTRAMUSCULAR | Status: AC
  Filled 2016-01-19: qty 1

## 2016-01-19 MED ORDER — HYDROCODONE-ACETAMINOPHEN 5-325 MG PO TABS: 1.0000 | ORAL_TABLET | Freq: Four times a day (QID) | ORAL | 0 refills | Status: DC | PRN

## 2016-01-19 MED ORDER — CHLORHEXIDINE GLUCONATE CLOTH 2 % EX PADS: 6.0000 | MEDICATED_PAD | Freq: Once | CUTANEOUS | Status: DC

## 2016-01-19 MED ORDER — BUPIVACAINE HCL (PF) 0.5 % IJ SOLN
INTRAMUSCULAR | Status: DC | PRN
  Administered 2016-01-19: 6 mL

## 2016-01-19 MED ORDER — SODIUM CHLORIDE 0.9 % IV SOLN
INTRAVENOUS | Status: DC
  Administered 2016-01-19: 07:00:00 via INTRAVENOUS

## 2016-01-19 MED ORDER — ONDANSETRON HCL 4 MG/2ML IJ SOLN
INTRAMUSCULAR | Status: DC | PRN
  Administered 2016-01-19: 4 mg via INTRAVENOUS

## 2016-01-19 MED ORDER — ONDANSETRON HCL 4 MG/2ML IJ SOLN: 4.0000 mg | Freq: Once | INTRAMUSCULAR | Status: DC | PRN

## 2016-01-19 MED ORDER — SODIUM CHLORIDE 0.9 % IV SOLN
INTRAVENOUS | Status: DC | PRN
  Administered 2016-01-19: 50 ug/min via INTRAVENOUS

## 2016-01-19 MED ORDER — LIDOCAINE HCL (CARDIAC) 20 MG/ML IV SOLN
INTRAVENOUS | Status: DC | PRN
  Administered 2016-01-19: 60 mg via INTRAVENOUS

## 2016-01-19 MED ORDER — FENTANYL CITRATE (PF) 100 MCG/2ML IJ SOLN
INTRAMUSCULAR | Status: DC | PRN
  Administered 2016-01-19: 50 ug via INTRAVENOUS

## 2016-01-19 MED ORDER — EPHEDRINE SULFATE 50 MG/ML IJ SOLN
INTRAMUSCULAR | Status: DC | PRN
  Administered 2016-01-19: 10 mg via INTRAVENOUS
  Administered 2016-01-19: 5 mg via INTRAVENOUS

## 2016-01-19 MED ORDER — PROPOFOL 10 MG/ML IV BOLUS
INTRAVENOUS | Status: DC | PRN
  Administered 2016-01-19: 50 mg via INTRAVENOUS
  Administered 2016-01-19: 110 mg via INTRAVENOUS

## 2016-01-19 MED ORDER — CLINDAMYCIN PHOSPHATE 300 MG/50ML IV SOLN
INTRAVENOUS | Status: AC
  Filled 2016-01-19: qty 50

## 2016-01-19 MED ORDER — FAMOTIDINE 20 MG PO TABS
20.0000 mg | ORAL_TABLET | Freq: Once | ORAL | Status: AC
  Administered 2016-01-19: 20 mg via ORAL

## 2016-01-19 MED ORDER — SODIUM CHLORIDE 0.9 % IV SOLN
INTRAVENOUS | Status: DC | PRN
  Administered 2016-01-19: 40 mL via INTRAMUSCULAR

## 2016-01-19 MED ORDER — BUPIVACAINE HCL (PF) 0.5 % IJ SOLN
INTRAMUSCULAR | Status: AC
  Filled 2016-01-19: qty 30

## 2016-01-19 MED ORDER — FENTANYL CITRATE (PF) 100 MCG/2ML IJ SOLN: 25.0000 ug | INTRAMUSCULAR | Status: DC | PRN

## 2016-01-19 MED ORDER — FAMOTIDINE 20 MG PO TABS
ORAL_TABLET | ORAL | Status: AC
  Administered 2016-01-19: 20 mg via ORAL
  Filled 2016-01-19: qty 1

## 2016-01-19 MED ORDER — GLYCOPYRROLATE 0.2 MG/ML IJ SOLN
INTRAMUSCULAR | Status: DC | PRN
  Administered 2016-01-19: 0.2 mg via INTRAVENOUS

## 2016-01-19 SURGICAL SUPPLY — 57 items
APPLIER CLIP 11 MED OPEN (CLIP)
APPLIER CLIP 9.375 SM OPEN (CLIP)
BAG COUNTER SPONGE EZ (MISCELLANEOUS) IMPLANT
BAG DECANTER FOR FLEXI CONT (MISCELLANEOUS) ×3 IMPLANT
BLADE SURG SZ11 CARB STEEL (BLADE) ×3 IMPLANT
BOOT SUTURE AID YELLOW STND (SUTURE) ×3 IMPLANT
BRUSH SCRUB 4% CHG (MISCELLANEOUS) ×3 IMPLANT
CANISTER SUCT 1200ML W/VALVE (MISCELLANEOUS) ×3 IMPLANT
CHLORAPREP W/TINT 26ML (MISCELLANEOUS) ×3 IMPLANT
CLIP APPLIE 11 MED OPEN (CLIP) IMPLANT
CLIP APPLIE 9.375 SM OPEN (CLIP) IMPLANT
COUNTER SPONGE BAG EZ (MISCELLANEOUS)
DERMABOND ADVANCED (GAUZE/BANDAGES/DRESSINGS) ×2
DERMABOND ADVANCED .7 DNX12 (GAUZE/BANDAGES/DRESSINGS) ×1 IMPLANT
DRESSING SURGICEL FIBRLLR 1X2 (HEMOSTASIS) ×1 IMPLANT
DRSG SURGICEL FIBRILLAR 1X2 (HEMOSTASIS) ×3
ELECT CAUTERY BLADE 6.4 (BLADE) ×3 IMPLANT
ELECT REM PT RETURN 9FT ADLT (ELECTROSURGICAL) ×3
ELECTRODE REM PT RTRN 9FT ADLT (ELECTROSURGICAL) ×1 IMPLANT
GLOVE BIO SURGEON STRL SZ7 (GLOVE) ×3 IMPLANT
GLOVE INDICATOR 7.5 STRL GRN (GLOVE) ×3 IMPLANT
GLOVE SURG SYN 8.0 (GLOVE) ×6 IMPLANT
GOWN STRL REUS W/ TWL LRG LVL3 (GOWN DISPOSABLE) ×1 IMPLANT
GOWN STRL REUS W/ TWL XL LVL3 (GOWN DISPOSABLE) ×1 IMPLANT
GOWN STRL REUS W/TWL LRG LVL3 (GOWN DISPOSABLE) ×2
GOWN STRL REUS W/TWL XL LVL3 (GOWN DISPOSABLE) ×2
GRAFT PROPATEN STD WALL 4 7X45 (Vascular Products) ×3 IMPLANT
IV NS 500ML (IV SOLUTION) ×2
IV NS 500ML BAXH (IV SOLUTION) ×1 IMPLANT
KIT RM TURNOVER STRD PROC AR (KITS) ×3 IMPLANT
LABEL OR SOLS (LABEL) IMPLANT
LOOP RED MAXI  1X406MM (MISCELLANEOUS) ×2
LOOP VESSEL MAXI 1X406 RED (MISCELLANEOUS) ×1 IMPLANT
LOOP VESSEL MINI 0.8X406 BLUE (MISCELLANEOUS) ×2 IMPLANT
LOOPS BLUE MINI 0.8X406MM (MISCELLANEOUS) ×4
NEEDLE FILTER BLUNT 18X 1/2SAF (NEEDLE) ×2
NEEDLE FILTER BLUNT 18X1 1/2 (NEEDLE) ×1 IMPLANT
NS IRRIG 500ML POUR BTL (IV SOLUTION) ×3 IMPLANT
PACK EXTREMITY ARMC (MISCELLANEOUS) ×3 IMPLANT
PAD PREP 24X41 OB/GYN DISP (PERSONAL CARE ITEMS) ×3 IMPLANT
PUNCH SURGICAL ROTATE 2.7MM (MISCELLANEOUS) IMPLANT
STOCKINETTE STRL 4IN 9604848 (GAUZE/BANDAGES/DRESSINGS) ×3 IMPLANT
SUT GTX CV-6 30 (SUTURE) ×6 IMPLANT
SUT MNCRL+ 5-0 UNDYED PC-3 (SUTURE) ×1 IMPLANT
SUT MONOCRYL 5-0 (SUTURE) ×2
SUT PROLENE 6 0 BV (SUTURE) ×12 IMPLANT
SUT SILK 2 0 (SUTURE) ×2
SUT SILK 2 0 SH (SUTURE) ×3 IMPLANT
SUT SILK 2-0 18XBRD TIE 12 (SUTURE) ×1 IMPLANT
SUT SILK 3 0 (SUTURE) ×2
SUT SILK 3-0 18XBRD TIE 12 (SUTURE) ×1 IMPLANT
SUT SILK 4 0 (SUTURE) ×2
SUT SILK 4-0 18XBRD TIE 12 (SUTURE) ×1 IMPLANT
SUT VIC AB 3-0 SH 27 (SUTURE) ×4
SUT VIC AB 3-0 SH 27X BRD (SUTURE) ×2 IMPLANT
SYR 20CC LL (SYRINGE) ×3 IMPLANT
SYR 3ML LL SCALE MARK (SYRINGE) ×3 IMPLANT

## 2016-01-19 NOTE — Op Note (Signed)
OPERATIVE NOTE   PROCEDURE: left brachial axillary arteriovenous graft placement  PRE-OPERATIVE DIAGNOSIS: End Stage Renal Disease  POST-OPERATIVE DIAGNOSIS: End Stage Renal Disease  SURGEON: [name]  ASSISTANT(S): None  ANESTHESIA: general  ESTIMATED BLOOD LOSS: <50 cc  FINDING(S): Good-sized brachial artery and axillary vein  SPECIMEN(S):  none  INDICATIONS:   Kerry Walsh is a 82 y.o. female who presents with end stage renal disease.  The patient is scheduled for left brachial axillary AV graft placement.  The patient is aware the risks include but are not limited to: bleeding, infection, steal syndrome, nerve damage, ischemic monomelic neuropathy, failure to mature, and need for additional procedures.  The patient is aware of the risks of the procedure and elects to proceed forward.  DESCRIPTION: After full informed written consent was obtained from the patient, the patient was brought back to the operating room and placed supine upon the operating table.  Prior to induction, the patient received IV antibiotics.   After obtaining adequate anesthesia, the patient was then prepped and draped in the standard fashion for a left arm access procedure.    A linear incision was then created along the medial border of the biceps muscle just proximal to the antecubital crease and the brachial artery which was exposed through. The brachial artery was then looped proximally and distally with Silastic Vesseloops. Side branches were controlled with 4-0 silk ties.  Attention was then turned to the exposure of the axillary vein. Linear incision was then created medial to the proximal portion of the biceps at the level of the anterior axillary crease. The axillary vein was exposed and again looped proximally and distally with Silastic vessel loops. Associated tributaries were also controlled with Silastic Vesseloops.  The Gore tunneler was then delivered onto the field and a  subcutaneous path was made from the arterial incision to the venous incision. A 4-7 tapered PTFE propatent graft by Simeon Craft was then pulled through the subcutaneous tunnel. The arterial 4 mm portion was then approximated to the brachial artery. Brachial artery was controlled proximally and distally with the Silastic Vesseloops. Arteriotomy was made with an 11 blade scalpel and extended with Potts scissors and a 6-0 Prolene stay suture was placed. End graft to side brachial artery anastomosis was then fashioned with running CV 6 suture. Flushing maneuvers were performed suture line was hemostatic and the graft was then assessed for proper position and ease of future cannulation. Heparinized saline was infused into the vein and the graft was clamped with a vascular clamp. With the graft pressurized it was approximated to the axillary vein in its native bed and then marked with a surgical marker. The vein was then delivered into the surgical field and controlled with the Silastic vessel loops. Venotomy was then made with an 11 blade scalpel and extended with Potts scissors and a 6-0 Prolene suture was used as stay suture. The the graft was then sewn to the vein in an end graft to side vein fashion using running CV 6 suture.  Flushing maneuvers were performed and the artery was allowed to forward and back bleed.  Flow was then established through the AV graft  There was good  thrill in the venous outflow, and there was 1+ palpable radial pulse.  At this point, I irrigated out the surgical wounds.  There was no further active bleeding.  The subcutaneous tissue was reapproximated with a running stitch of 3-0 Vicryl.  The skin was then reapproximated with a  running subcuticular stitch of 4-0 Vicryl.  The skin was then cleaned, dried, and reinforced with Dermabond.    The patient tolerated this procedure well.   COMPLICATIONS: None  CONDITION: Kerry Walsh [facility]  Office:  [phone]   01/19/2016, 10:00 AM

## 2016-01-19 NOTE — Progress Notes (Signed)
Vitamin K given yesterday in MD office

## 2016-01-19 NOTE — Anesthesia Preprocedure Evaluation (Signed)
Anesthesia Evaluation  Patient identified by MRN, date of birth, ID band Patient awake  General Assessment Comment:Slow mentation  Airway Mallampati: II       Dental  (+) Teeth Intact   Pulmonary neg pulmonary ROS,    breath sounds clear to auscultation       Cardiovascular hypertension, Pt. on medications + CAD and +CHF  + dysrhythmias Atrial Fibrillation  Rhythm:Regular Rate:Normal     Neuro/Psych Anxiety Depression CVA    GI/Hepatic negative GI ROS, Neg liver ROS,   Endo/Other  Hypothyroidism   Renal/GU DialysisRenal disease     Musculoskeletal   Abdominal Normal abdominal exam  (+)   Peds  Hematology  (+) anemia ,   Anesthesia Other Findings   Reproductive/Obstetrics                             Anesthesia Physical Anesthesia Plan  ASA: IV  Anesthesia Plan: General   Post-op Pain Management:    Induction: Intravenous  Airway Management Planned: LMA  Additional Equipment:   Intra-op Plan:   Post-operative Plan: Extubation in OR  Informed Consent: I have reviewed the patients History and Physical, chart, labs and discussed the procedure including the risks, benefits and alternatives for the proposed anesthesia with the patient or authorized representative who has indicated his/her understanding and acceptance.     Plan Discussed with: CRNA  Anesthesia Plan Comments:         Anesthesia Quick Evaluation

## 2016-01-19 NOTE — Anesthesia Procedure Notes (Signed)
Procedure Name: LMA Insertion Date/Time: 01/19/2016 7:49 AM Performed by: Silvana Newness Pre-anesthesia Checklist: Patient identified, Emergency Drugs available, Suction available, Patient being monitored and Timeout performed Patient Re-evaluated:Patient Re-evaluated prior to inductionOxygen Delivery Method: Circle system utilized Preoxygenation: Pre-oxygenation with 100% oxygen Intubation Type: IV induction Ventilation: Mask ventilation without difficulty LMA: LMA inserted LMA Size: 4.0 Number of attempts: 1 Placement Confirmation: positive ETCO2 and breath sounds checked- equal and bilateral Tube secured with: Tape

## 2016-01-19 NOTE — OR Nursing (Signed)
Patient has good thrill between incisions near lower incision. No swelling noted of left arm.

## 2016-01-19 NOTE — Transfer of Care (Signed)
Immediate Anesthesia Transfer of Care Note  Patient: Kerry Walsh  Procedure(s) Performed: Procedure(s): INSERTION OF ARTERIOVENOUS (AV) GORE-TEX GRAFT ARM ( BRACH / AXILLARY ) (Left)  Patient Location: PACU  Anesthesia Type:General  Level of Consciousness: awake and patient cooperative  Airway & Oxygen Therapy: Patient Spontanous Breathing and Patient connected to face mask oxygen  Post-op Assessment: Report given to RN, Post -op Vital signs reviewed and stable and Patient moving all extremities X 4  Post vital signs: Reviewed and stable  Last Vitals:  Vitals:   01/19/16 0609  BP: (!) 150/79  Pulse: 70  Resp: 16  Temp: 36.4 C    Last Pain:  Vitals:   01/19/16 0609  TempSrc: Tympanic         Complications: No apparent anesthesia complications

## 2016-01-19 NOTE — Discharge Instructions (Signed)

## 2016-01-19 NOTE — Anesthesia Postprocedure Evaluation (Signed)
Anesthesia Post Note  Patient: Kerry Walsh  Procedure(s) Performed: Procedure(s) (LRB): INSERTION OF ARTERIOVENOUS (AV) GORE-TEX GRAFT ARM ( BRACH / AXILLARY ) (Left)  Patient location during evaluation: PACU Anesthesia Type: General Level of consciousness: awake Pain management: pain level controlled Vital Signs Assessment: post-procedure vital signs reviewed and stable Respiratory status: spontaneous breathing Cardiovascular status: stable Anesthetic complications: no    Last Vitals:  Vitals:   01/19/16 1102 01/19/16 1120  BP: 119/60 (!) 133/58  Pulse: 77 74  Resp: 17 16  Temp: 37.1 C 36.8 C    Last Pain:  Vitals:   01/19/16 1120  TempSrc: Temporal  PainSc: 0-No pain                 VAN STAVEREN,Pa Tennant

## 2016-01-19 NOTE — H&P (Signed)
[facility] History & Physical Update  The patient was interviewed and re-examined.  The patient's previous History and Physical has been reviewed and is unchanged.  There is no change in the plan of care. We plan to proceed with the scheduled procedure.  [name], MD  01/19/2016, 7:26 AM

## 2016-02-02 ENCOUNTER — Inpatient Hospital Stay: Payer: Medicare Other | Attending: Internal Medicine

## 2016-02-02 ENCOUNTER — Ambulatory Visit: Payer: Medicare Other

## 2016-02-02 DIAGNOSIS — Z79899 Other long term (current) drug therapy: Secondary | ICD-10-CM | POA: Diagnosis not present

## 2016-02-02 DIAGNOSIS — C7A1 Malignant poorly differentiated neuroendocrine tumors: Secondary | ICD-10-CM | POA: Insufficient documentation

## 2016-02-02 DIAGNOSIS — C7A098 Malignant carcinoid tumors of other sites: Secondary | ICD-10-CM

## 2016-02-02 MED ORDER — OCTREOTIDE ACETATE 30 MG IM KIT
30.0000 mg | PACK | Freq: Once | INTRAMUSCULAR | Status: AC
  Administered 2016-02-02: 30 mg via INTRAMUSCULAR
  Filled 2016-02-02: qty 1

## 2016-02-03 ENCOUNTER — Ambulatory Visit (INDEPENDENT_AMBULATORY_CARE_PROVIDER_SITE_OTHER): Payer: Medicare Other | Admitting: Vascular Surgery

## 2016-02-03 ENCOUNTER — Encounter (INDEPENDENT_AMBULATORY_CARE_PROVIDER_SITE_OTHER): Payer: Self-pay | Admitting: Vascular Surgery

## 2016-02-03 VITALS — BP 144/66 | HR 76 | Resp 16 | Wt 121.0 lb

## 2016-02-03 DIAGNOSIS — N186 End stage renal disease: Secondary | ICD-10-CM

## 2016-02-03 DIAGNOSIS — I2510 Atherosclerotic heart disease of native coronary artery without angina pectoris: Secondary | ICD-10-CM

## 2016-02-03 DIAGNOSIS — I482 Chronic atrial fibrillation, unspecified: Secondary | ICD-10-CM

## 2016-02-03 DIAGNOSIS — Z992 Dependence on renal dialysis: Secondary | ICD-10-CM

## 2016-02-03 DIAGNOSIS — I10 Essential (primary) hypertension: Secondary | ICD-10-CM

## 2016-02-03 NOTE — Progress Notes (Signed)
Patient ID: Kerry Walsh, female   DOB: 05-14-1925, 82 y.o.   MRN: [id number]  Chief Complaint  Patient presents with  . Follow-up    HPI Kerry Walsh is a 82 y.o. female.  S/p creation of a left brachial axillary AV graft 01/19/2016.  She reports no problems and denies pain.   Past Medical History:  Diagnosis Date  . Anemia   . Anxiety   . Arthritis   . Atrial fibrillation (Kerry Walsh)   . Cancer [facility])    Carcinoid Tumor  . CHF (congestive heart failure) (Kerry Walsh)   . Chronic kidney disease    CKD with last known creatinine fo 2.4 in Dec 2015  . Coronary artery disease   . Dementia   . Depression   . Dialysis patient [facility])    Mon. Wed. Fri.  . Dysrhythmia    Atrial Fibrillation  . Hypertension   . Hypothyroidism   . Malignant poorly differentiated neuroendocrine carcinoma (Kerry Walsh) 08/12/2014  . Mitral valve regurgitation   . Osteoporosis   . Stroke Kerry Walsh)    No residual neurological deficits    Past Surgical History:  Procedure Laterality Date  . ABDOMINAL HYSTERECTOMY    . AV FISTULA PLACEMENT Left 01/19/2016   Procedure: INSERTION OF ARTERIOVENOUS (AV) GORE-TEX GRAFT ARM ( BRACH / AXILLARY );  Surgeon: Katha Cabal, MD;  Location: [facility] ORS;  Service: Vascular;  Laterality: Left;  . BACK SURGERY    . CHOLECYSTECTOMY    . EYE SURGERY Bilateral    Cataract Extraction with IOL  . JOINT REPLACEMENT Bilateral    Knee replacement surgery- bilateral  . PERIPHERAL VASCULAR CATHETERIZATION N/A 07/27/2015   Procedure: Dialysis/Perma Catheter Insertion;  Surgeon: Katha Cabal, MD;  Location: [facility];  Service: Cardiovascular;  Laterality: N/A;  . SHOULDER SURGERY Bilateral       Allergies  Allergen Reactions  . Penicillins Rash    Has patient had a PCN reaction causing immediate rash, facial/tongue/throat swelling, SOB or lightheadedness with hypotension: Yes Has patient had a PCN reaction causing severe rash involving mucus membranes or skin  necrosis: No Has patient had a PCN reaction that required hospitalization No Has patient had a PCN reaction occurring within the last 10 years: No If all of the above answers are "NO", then may proceed with Cephalosporin use.     Current Outpatient Prescriptions  Medication Sig Dispense Refill  . acetaminophen (TYLENOL) 500 MG tablet Take 500-1,000 mg by mouth every 6 (six) hours as needed (for pain).    Marland Kitchen amLODipine (NORVASC) 5 MG tablet Take 5 mg by mouth every evening.     Marland Kitchen atorvastatin (LIPITOR) 20 MG tablet Take 20 mg by mouth daily at 6 PM.    . citalopram (CELEXA) 20 MG tablet Take 20 mg by mouth daily.     Marland Kitchen HYDROcodone-acetaminophen (NORCO) 5-325 MG tablet Take 1-2 tablets by mouth every 6 (six) hours as needed for moderate pain or severe pain. 50 tablet 0  . levothyroxine (SYNTHROID, LEVOTHROID) 88 MCG tablet Take 88 mcg by mouth daily before breakfast.     . metoprolol (LOPRESSOR) 50 MG tablet Take 1 tablet (50 mg total) by mouth 2 (two) times daily. (Patient taking differently: Take 50 mg by mouth every evening. ) 60 tablet 0  . multivitamin (RENA-VIT) TABS tablet Take 1 tablet by mouth daily.    Marland Kitchen torsemide (DEMADEX) 10 MG tablet Take 10 mg by mouth daily.     Marland Kitchen  warfarin (COUMADIN) 2.5 MG tablet Take 2.5 mg by mouth daily at 6 PM. Mon, Wed, Fri, Sat, Sun only    . warfarin (COUMADIN) 5 MG tablet Take 5 mg by mouth daily at 6 PM. Tues, Thurs only     No current facility-administered medications for this visit.         Physical Exam BP (!) 144/66   Pulse 76   Resp 16   Wt 121 lb (54.9 kg)   BMI 22.13 kg/m  Gen:  WD/WN, NAD Skin: incision C/D/I AV Access:  Left arm brachial axillary graft good thrill good bruit     Assessment/Plan: 1. CKD (chronic kidney disease) stage V requiring chronic dialysis (Kerry Walsh) OK to access in one more week  - VAS US DUPLEX DIALYSIS ACCESS (AVF,AVG); Future  2. Chronic atrial fibrillation ([name]) Continue antiarrhythmic and cardiac  medications as already ordered and reviewed, no changes at this time.  3. Arteriosclerosis of coronary artery Continue cardiac medications as already ordered and reviewed, no changes at this time.  Continue statin as ordered and reviewed, no changes at this time  4. Essential hypertension Continue antihypertensive medications as already ordered and reviewed, no changes at this time   Kerry Walsh 02/03/2016, 2:04 PM   This note was created with Dragon medical transcription system.  Any errors from dictation are unintentional.

## 2016-02-24 ENCOUNTER — Encounter: Payer: Self-pay | Admitting: Vascular Surgery

## 2016-02-29 ENCOUNTER — Ambulatory Visit (INDEPENDENT_AMBULATORY_CARE_PROVIDER_SITE_OTHER): Payer: Medicare Other

## 2016-02-29 ENCOUNTER — Encounter (INDEPENDENT_AMBULATORY_CARE_PROVIDER_SITE_OTHER): Payer: Self-pay | Admitting: Vascular Surgery

## 2016-02-29 ENCOUNTER — Ambulatory Visit (INDEPENDENT_AMBULATORY_CARE_PROVIDER_SITE_OTHER): Payer: Medicare Other | Admitting: Vascular Surgery

## 2016-02-29 VITALS — BP 134/64 | HR 66 | Resp 16 | Wt 120.0 lb

## 2016-02-29 DIAGNOSIS — I10 Essential (primary) hypertension: Secondary | ICD-10-CM

## 2016-02-29 DIAGNOSIS — Z992 Dependence on renal dialysis: Secondary | ICD-10-CM

## 2016-02-29 DIAGNOSIS — N186 End stage renal disease: Secondary | ICD-10-CM | POA: Diagnosis not present

## 2016-02-29 DIAGNOSIS — E785 Hyperlipidemia, unspecified: Secondary | ICD-10-CM | POA: Insufficient documentation

## 2016-02-29 NOTE — Progress Notes (Signed)
Subjective:    Patient ID: Kerry Walsh, female    DOB: [birth year], 83 y.o.   MRN: [id number] Chief Complaint  Patient presents with  . Follow-up   Patient presents for her first post-operative visit. She is s/p creation of a left brachial axillary AV graft 01/19/2016. Patient has been dialyzing without any issue. The patient underwent a duplex ultrasound of the AV access which was notable for a patent graft without any significant hemodynamic stenosis. The patient denies any issues with hemodialysis such as cannulation problems, increased bleeding, decrease in doppler flow or recirculation. The patient also denies any graft skin breakdown, pain, edema, pallor or ulceration of the arm / hand.    Review of Systems  Constitutional: Negative.   HENT: Negative.   Eyes: Negative.   Respiratory: Negative.   Cardiovascular: Negative.   Gastrointestinal: Negative.   Endocrine: Negative.   Genitourinary: Negative.   Musculoskeletal: Negative.   Skin: Negative.   Allergic/Immunologic: Negative.   Neurological: Negative.   Hematological: Negative.   Psychiatric/Behavioral: Negative.        Objective:   Physical Exam  Constitutional: She is oriented to person, place, and time. She appears well-developed and well-nourished.  HENT:  Head: Normocephalic and atraumatic.  Right Ear: External ear normal.  Left Ear: External ear normal.  Eyes: Conjunctivae and EOM are normal. Pupils are equal, round, and reactive to light.  Neck: Normal range of motion.  Cardiovascular: Normal rate, regular rhythm, normal heart sounds and intact distal pulses.   Pulses:      Radial pulses are 2+ on the right side, and 2+ on the left side.  Access: Good bruit and trill  Pulmonary/Chest: Effort normal and breath sounds normal.  Abdominal: Soft. Bowel sounds are normal.  Musculoskeletal: Normal range of motion. She exhibits no edema.  Neurological: She is alert and oriented to person, place, and time.    Skin: Skin is warm and dry.  Psychiatric: She has a normal mood and affect. Her behavior is normal. Judgment and thought content normal.    BP 134/64   Pulse 66   Resp 16   Wt 120 lb (54.4 kg)   BMI 21.95 kg/m   Past Medical History:  Diagnosis Date  . Anemia   . Anxiety   . Arthritis   . Atrial fibrillation ([place])   . Cancer [facility])    Carcinoid Tumor  . CHF (congestive heart failure) ([place])   . Chronic kidney disease    CKD with last known creatinine fo 2.4 in Dec 2015  . Coronary artery disease   . Dementia   . Depression   . Dialysis patient [facility])    Mon. Wed. Fri.  . Dysrhythmia    Atrial Fibrillation  . Hypertension   . Hypothyroidism   . Malignant poorly differentiated neuroendocrine carcinoma ([place]) 08/12/2014  . Mitral valve regurgitation   . Osteoporosis   . Stroke [facility])    No residual neurological deficits    Social History   Social History  . Marital status: Married    Spouse name: N/A  . Number of children: N/A  . Years of education: N/A   Occupational History  . Not on file.   Social History Main Topics  . Smoking status: Never Smoker  . Smokeless tobacco: Never Used  . Alcohol use No  . Drug use: No  . Sexual activity: No     Comment: Hysterectomy   Other Topics Concern  . Not on file  Social History Narrative  . No narrative on file    Past Surgical History:  Procedure Laterality Date  . ABDOMINAL HYSTERECTOMY    . AV FISTULA PLACEMENT Left 01/19/2016   Procedure: INSERTION OF ARTERIOVENOUS (AV) GORE-TEX GRAFT ARM ( BRACH / AXILLARY );  Surgeon: Katha Cabal, MD;  Location: [facility] ORS;  Service: Vascular;  Laterality: Left;  . BACK SURGERY    . CHOLECYSTECTOMY    . EYE SURGERY Bilateral    Cataract Extraction with IOL  . JOINT REPLACEMENT Bilateral    Knee replacement surgery- bilateral  . PERIPHERAL VASCULAR CATHETERIZATION N/A 07/27/2015   Procedure: Dialysis/Perma Catheter Insertion;  Surgeon: Katha Cabal, MD;   Location: [facility];  Service: Cardiovascular;  Laterality: N/A;  . SHOULDER SURGERY Bilateral     Family History  Problem Relation Age of Onset  . CAD Father   . Stroke Father   . Kidney failure Mother     Allergies  Allergen Reactions  . Penicillins Rash    Has patient had a PCN reaction causing immediate rash, facial/tongue/throat swelling, SOB or lightheadedness with hypotension: Yes Has patient had a PCN reaction causing severe rash involving mucus membranes or skin necrosis: No Has patient had a PCN reaction that required hospitalization No Has patient had a PCN reaction occurring within the last 10 years: No If all of the above answers are "NO", then may proceed with Cephalosporin use.       Assessment & Plan:  Patient presents for her first post-operative visit. She is s/p creation of a left brachial axillary AV graft 01/19/2016. Patient has been dialyzing without any issue. The patient underwent a duplex ultrasound of the AV access which was notable for a patent graft without any significant hemodynamic stenosis. The patient denies any issues with hemodialysis such as cannulation problems, increased bleeding, decrease in doppler flow or recirculation. The patient also denies any graft skin breakdown, pain, edema, pallor or ulceration of the arm / hand.  1. ESRD on dialysis ([place]) - Stable Studies reviewed with patient. The patient is doing well and currently has adequate dialysis access. Duplex ultrasound of the AV access shows a patent access with no evidence of hemodynamically significant strictures or stenosis.  The patient should continue to have duplex ultrasounds of the dialysis access every six months. The patient was instructed to call the office in the interim if any issues with dialysis access / doppler flow, pain, edema, pallor, fistula skin breakdown or ulceration of the arm / hand occur. The patient expressed their understanding.  2. Essential  hypertension - Stable Encouraged good control as its slows the progression of atherosclerotic disease  3. Hyperlipidemia, unspecified hyperlipidemia type - Stable Encouraged good control as its slows the progression of atherosclerotic disease  Current Outpatient Prescriptions on File Prior to Visit  Medication Sig Dispense Refill  . acetaminophen (TYLENOL) 500 MG tablet Take 500-1,000 mg by mouth every 6 (six) hours as needed (for pain).    Marland Kitchen amLODipine (NORVASC) 5 MG tablet Take 5 mg by mouth every evening.     Marland Kitchen atorvastatin (LIPITOR) 20 MG tablet Take 20 mg by mouth daily at 6 PM.    . citalopram (CELEXA) 20 MG tablet Take 20 mg by mouth daily.     Marland Kitchen HYDROcodone-acetaminophen (NORCO) 5-325 MG tablet Take 1-2 tablets by mouth every 6 (six) hours as needed for moderate pain or severe pain. 50 tablet 0  . levothyroxine (SYNTHROID, LEVOTHROID) 88 MCG tablet Take 88  mcg by mouth daily before breakfast.     . metoprolol (LOPRESSOR) 50 MG tablet Take 1 tablet (50 mg total) by mouth 2 (two) times daily. (Patient taking differently: Take 50 mg by mouth every evening. ) 60 tablet 0  . multivitamin (RENA-VIT) TABS tablet Take 1 tablet by mouth daily.    Marland Kitchen torsemide (DEMADEX) 10 MG tablet Take 10 mg by mouth daily.     Marland Kitchen warfarin (COUMADIN) 2.5 MG tablet Take 2.5 mg by mouth daily at 6 PM. Mon, Wed, Fri, Sat, Sun only    . warfarin (COUMADIN) 5 MG tablet Take 5 mg by mouth daily at 6 PM. Tues, Thurs only     No current facility-administered medications on file prior to visit.     There are no Patient Instructions on file for this visit. No Follow-up on file.   Elonda Giuliano A Magalene Mclear, PA-C

## 2016-03-06 ENCOUNTER — Ambulatory Visit: Payer: Medicare Other

## 2016-03-06 ENCOUNTER — Other Ambulatory Visit: Payer: Medicare Other

## 2016-03-06 ENCOUNTER — Ambulatory Visit: Payer: Medicare Other | Admitting: Internal Medicine

## 2016-03-13 ENCOUNTER — Encounter (INDEPENDENT_AMBULATORY_CARE_PROVIDER_SITE_OTHER): Payer: Self-pay

## 2016-03-13 ENCOUNTER — Other Ambulatory Visit (INDEPENDENT_AMBULATORY_CARE_PROVIDER_SITE_OTHER): Payer: Self-pay | Admitting: Vascular Surgery

## 2016-03-16 ENCOUNTER — Inpatient Hospital Stay: Payer: Medicare Other

## 2016-03-16 ENCOUNTER — Inpatient Hospital Stay (HOSPITAL_BASED_OUTPATIENT_CLINIC_OR_DEPARTMENT_OTHER): Payer: Medicare Other | Admitting: Internal Medicine

## 2016-03-16 ENCOUNTER — Inpatient Hospital Stay: Payer: Medicare Other | Attending: Internal Medicine

## 2016-03-16 VITALS — BP 135/55 | HR 59 | Temp 97.5°F | Wt 116.0 lb

## 2016-03-16 DIAGNOSIS — M818 Other osteoporosis without current pathological fracture: Secondary | ICD-10-CM | POA: Insufficient documentation

## 2016-03-16 DIAGNOSIS — I2510 Atherosclerotic heart disease of native coronary artery without angina pectoris: Secondary | ICD-10-CM | POA: Insufficient documentation

## 2016-03-16 DIAGNOSIS — E039 Hypothyroidism, unspecified: Secondary | ICD-10-CM

## 2016-03-16 DIAGNOSIS — Z7901 Long term (current) use of anticoagulants: Secondary | ICD-10-CM

## 2016-03-16 DIAGNOSIS — N186 End stage renal disease: Secondary | ICD-10-CM | POA: Insufficient documentation

## 2016-03-16 DIAGNOSIS — R634 Abnormal weight loss: Secondary | ICD-10-CM | POA: Diagnosis not present

## 2016-03-16 DIAGNOSIS — I129 Hypertensive chronic kidney disease with stage 1 through stage 4 chronic kidney disease, or unspecified chronic kidney disease: Secondary | ICD-10-CM | POA: Diagnosis not present

## 2016-03-16 DIAGNOSIS — F419 Anxiety disorder, unspecified: Secondary | ICD-10-CM | POA: Diagnosis not present

## 2016-03-16 DIAGNOSIS — M129 Arthropathy, unspecified: Secondary | ICD-10-CM

## 2016-03-16 DIAGNOSIS — Z992 Dependence on renal dialysis: Secondary | ICD-10-CM

## 2016-03-16 DIAGNOSIS — I340 Nonrheumatic mitral (valve) insufficiency: Secondary | ICD-10-CM

## 2016-03-16 DIAGNOSIS — I4891 Unspecified atrial fibrillation: Secondary | ICD-10-CM | POA: Insufficient documentation

## 2016-03-16 DIAGNOSIS — F0390 Unspecified dementia without behavioral disturbance: Secondary | ICD-10-CM

## 2016-03-16 DIAGNOSIS — F329 Major depressive disorder, single episode, unspecified: Secondary | ICD-10-CM | POA: Diagnosis not present

## 2016-03-16 DIAGNOSIS — Z79899 Other long term (current) drug therapy: Secondary | ICD-10-CM | POA: Insufficient documentation

## 2016-03-16 DIAGNOSIS — Z8673 Personal history of transient ischemic attack (TIA), and cerebral infarction without residual deficits: Secondary | ICD-10-CM | POA: Diagnosis not present

## 2016-03-16 DIAGNOSIS — C7A1 Malignant poorly differentiated neuroendocrine tumors: Secondary | ICD-10-CM

## 2016-03-16 DIAGNOSIS — R630 Anorexia: Secondary | ICD-10-CM

## 2016-03-16 DIAGNOSIS — D631 Anemia in chronic kidney disease: Secondary | ICD-10-CM | POA: Diagnosis not present

## 2016-03-16 DIAGNOSIS — C7A098 Malignant carcinoid tumors of other sites: Secondary | ICD-10-CM

## 2016-03-16 DIAGNOSIS — I509 Heart failure, unspecified: Secondary | ICD-10-CM

## 2016-03-16 DIAGNOSIS — E340 Carcinoid syndrome: Secondary | ICD-10-CM

## 2016-03-16 LAB — CBC WITH DIFFERENTIAL/PLATELET
BASOS ABS: 0.1 10*3/uL (ref 0–0.1)
BASOS PCT: 2 %
Eosinophils Absolute: 0.1 10*3/uL (ref 0–0.7)
Eosinophils Relative: 3 %
HEMATOCRIT: 33.8 % — AB (ref 35.0–47.0)
HEMOGLOBIN: 11.5 g/dL — AB (ref 12.0–16.0)
LYMPHS PCT: 18 %
Lymphs Abs: 0.8 10*3/uL — ABNORMAL LOW (ref 1.0–3.6)
MCH: 30.5 pg (ref 26.0–34.0)
MCHC: 33.9 g/dL (ref 32.0–36.0)
MCV: 89.9 fL (ref 80.0–100.0)
MONO ABS: 0.3 10*3/uL (ref 0.2–0.9)
MONOS PCT: 8 %
NEUTROS ABS: 2.8 10*3/uL (ref 1.4–6.5)
NEUTROS PCT: 69 %
Platelets: 130 10*3/uL — ABNORMAL LOW (ref 150–440)
RBC: 3.76 MIL/uL — ABNORMAL LOW (ref 3.80–5.20)
RDW: 16.3 % — AB (ref 11.5–14.5)
WBC: 4.1 10*3/uL (ref 3.6–11.0)

## 2016-03-16 LAB — COMPREHENSIVE METABOLIC PANEL
ALBUMIN: 3.9 g/dL (ref 3.5–5.0)
ALK PHOS: 66 U/L (ref 38–126)
ALT: 22 U/L (ref 14–54)
AST: 31 U/L (ref 15–41)
Anion gap: 9 (ref 5–15)
BILIRUBIN TOTAL: 0.9 mg/dL (ref 0.3–1.2)
BUN: 15 mg/dL (ref 6–20)
CALCIUM: 9 mg/dL (ref 8.9–10.3)
CO2: 31 mmol/L (ref 22–32)
CREATININE: 2.7 mg/dL — AB (ref 0.44–1.00)
Chloride: 97 mmol/L — ABNORMAL LOW (ref 101–111)
GFR calc Af Amer: 17 mL/min — ABNORMAL LOW (ref >60)
GFR, EST NON AFRICAN AMERICAN: 15 mL/min — AB (ref >60)
GLUCOSE: 100 mg/dL — AB (ref 65–99)
POTASSIUM: 3.3 mmol/L — AB (ref 3.5–5.1)
Sodium: 137 mmol/L (ref 135–145)
TOTAL PROTEIN: 6.5 g/dL (ref 6.5–8.1)

## 2016-03-16 MED ORDER — OCTREOTIDE ACETATE 30 MG IM KIT
30.0000 mg | PACK | Freq: Once | INTRAMUSCULAR | Status: AC
  Administered 2016-03-16: 30 mg via INTRAMUSCULAR
  Filled 2016-03-16: qty 1

## 2016-03-16 NOTE — Progress Notes (Signed)
[place] OFFICE PROGRESS NOTE  Patient Care Team: Kirk Ruths, MD as PCP - General (Internal Medicine)  Cancer Staging No matching staging information was found for the patient.   Oncology History   1. Ovarian cystic mass underwent robotic bilateral salpingo-oophorectomy in April of 2011. Neuroendocrine tumor, poorly differentiated carcinoid 2. Octreotide scan revealed mets to retro peritoneal lymph node. Patient underwent lymph node dissection in July of 2011. 3. Patient has been started on Sandostatin LAR from September of 2012 because of symptoms of carcinoid syndrome.  # dementia- mild; ESRD- on HD.      Malignant poorly differentiated neuroendocrine carcinoma ([place])   HD-Dr.Kolluru-summer 2017.      INTERVAL HISTORY:  Kerry Walsh 83 y.o.  female pleasant patient above history of Carcinoid of the ovary is here for follow-up; patient is currently on Sandostatin.   Patient denies diarrhea. Denies any wheezing or shortness of breath or cough. She continues to be on dialysis. No swelling in the legs. Patient complains of weight loss. Poor appetite.  REVIEW OF SYSTEMS:  A complete 10 point review of system is done which is negative except mentioned above/history of present illness.   PAST MEDICAL HISTORY :  Past Medical History:  Diagnosis Date  . Anemia   . Anxiety   . Arthritis   . Atrial fibrillation ([place])   . Cancer [facility])    Carcinoid Tumor  . CHF (congestive heart failure) ([place])   . Chronic kidney disease    CKD with last known creatinine fo 2.4 in Dec 2015  . Coronary artery disease   . Dementia   . Depression   . Dialysis patient [name])    Mon. Wed. Fri.  . Dysrhythmia    Atrial Fibrillation  . Hypertension   . Hypothyroidism   . Malignant poorly differentiated neuroendocrine carcinoma ([place]) 08/12/2014  . Mitral valve regurgitation   . Osteoporosis   . Stroke (Kerry Walsh)    No residual neurological deficits    PAST SURGICAL  HISTORY :   Past Surgical History:  Procedure Laterality Date  . ABDOMINAL HYSTERECTOMY    . AV FISTULA PLACEMENT Left 01/19/2016   Procedure: INSERTION OF ARTERIOVENOUS (AV) GORE-TEX GRAFT ARM ( BRACH / AXILLARY );  Surgeon: Katha Cabal, MD;  Location: [facility] ORS;  Service: Vascular;  Laterality: Left;  . BACK SURGERY    . CHOLECYSTECTOMY    . EYE SURGERY Bilateral    Cataract Extraction with IOL  . JOINT REPLACEMENT Bilateral    Knee replacement surgery- bilateral  . PERIPHERAL VASCULAR CATHETERIZATION N/A 07/27/2015   Procedure: Dialysis/Perma Catheter Insertion;  Surgeon: Katha Cabal, MD;  Location: [place] CV [facility];  Service: Cardiovascular;  Laterality: N/A;  . SHOULDER SURGERY Bilateral     FAMILY HISTORY :   Family History  Problem Relation Age of Onset  . CAD Father   . Stroke Father   . Kidney failure Mother     SOCIAL HISTORY:   Social History  Substance Use Topics  . Smoking status: Never Smoker  . Smokeless tobacco: Never Used  . Alcohol use No    ALLERGIES:  is allergic to penicillins.  MEDICATIONS:  Current Outpatient Prescriptions  Medication Sig Dispense Refill  . acetaminophen (TYLENOL) 500 MG tablet Take 500-1,000 mg by mouth every 6 (six) hours as needed (for pain).    Marland Kitchen amLODipine (NORVASC) 5 MG tablet Take 5 mg by mouth daily.     Marland Kitchen atorvastatin (LIPITOR) 20 MG tablet  Take 20 mg by mouth daily.     . citalopram (CELEXA) 20 MG tablet Take 20 mg by mouth daily.     Marland Kitchen levothyroxine (SYNTHROID, LEVOTHROID) 88 MCG tablet Take 88 mcg by mouth daily before breakfast.     . metoprolol (LOPRESSOR) 50 MG tablet Take 1 tablet (50 mg total) by mouth 2 (two) times daily. (Patient taking differently: Take 50 mg by mouth every evening. ) 60 tablet 0  . multivitamin (RENA-VIT) TABS tablet Take 1 tablet by mouth daily.    Marland Kitchen torsemide (DEMADEX) 10 MG tablet Take 10 mg by mouth daily.     Marland Kitchen warfarin (COUMADIN) 2.5 MG tablet Take 2.5 mg by mouth every other  day.     . warfarin (COUMADIN) 5 MG tablet Take 5 mg by mouth every other day.      No current facility-administered medications for this visit.     PHYSICAL EXAMINATION: ECOG PERFORMANCE STATUS: 0 - Asymptomatic  BP (!) 135/55 (BP Location: Right Arm, Patient Position: Sitting)   Pulse (!) 59   Temp 97.5 F (36.4 C) (Tympanic)   Wt 116 lb (52.6 kg)   BMI 21.22 kg/m   Filed Weights   03/16/16 1058  Weight: 116 lb (52.6 kg)    GENERAL: Frail-appearing thin built Caucasian female patient is walking herself; Alert, no distress and comfortable.   alone EYES: no pallor or icterus OROPHARYNX: no thrush or ulceration; good dentition  NECK: supple, no masses felt LYMPH:  no palpable lymphadenopathy in the cervical, axillary or inguinal regions LUNGS: clear to auscultation and  No wheeze or crackles HEART/CVS: regular rate & rhythm and no murmurs; No lower extremity edema ABDOMEN:abdomen soft, non-tender and normal bowel sounds Musculoskeletal:no cyanosis of digits and no clubbing  PSYCH: alert & oriented x 3 with fluent speech NEURO: no focal motor/sensory deficits SKIN:  no rashes or significant lesions  LABORATORY DATA:  I have reviewed the data as listed    Component Value Date/Time   NA 137 03/16/2016 1020   NA 135 05/20/2014 1402   K 3.3 (L) 03/16/2016 1020   K 4.5 05/20/2014 1402   CL 97 (L) 03/16/2016 1020   CL 106 05/20/2014 1402   CO2 31 03/16/2016 1020   CO2 22 05/20/2014 1402   GLUCOSE 100 (H) 03/16/2016 1020   GLUCOSE 91 05/20/2014 1402   BUN 15 03/16/2016 1020   BUN 61 (H) 05/20/2014 1402   CREATININE 2.70 (H) 03/16/2016 1020   CREATININE 2.72 (H) 05/20/2014 1402   CALCIUM 9.0 03/16/2016 1020   CALCIUM 8.7 (L) 05/20/2014 1402   PROT 6.5 03/16/2016 1020   PROT 7.0 05/20/2014 1402   ALBUMIN 3.9 03/16/2016 1020   ALBUMIN 4.1 05/20/2014 1402   AST 31 03/16/2016 1020   AST 23 05/20/2014 1402   ALT 22 03/16/2016 1020   ALT 12 (L) 05/20/2014 1402   ALKPHOS  66 03/16/2016 1020   ALKPHOS 90 05/20/2014 1402   BILITOT 0.9 03/16/2016 1020   BILITOT 0.5 05/20/2014 1402   GFRNONAA 15 (L) 03/16/2016 1020   GFRNONAA 15 (L) 05/20/2014 1402   GFRAA 17 (L) 03/16/2016 1020   GFRAA 18 (L) 05/20/2014 1402    No results found for: SPEP, UPEP  Lab Results  Component Value Date   WBC 4.1 03/16/2016   NEUTROABS 2.8 03/16/2016   HGB 11.5 (L) 03/16/2016   HCT 33.8 (L) 03/16/2016   MCV 89.9 03/16/2016   PLT 130 (L) 03/16/2016  Chemistry      Component Value Date/Time   NA 137 03/16/2016 1020   NA 135 05/20/2014 1402   K 3.3 (L) 03/16/2016 1020   K 4.5 05/20/2014 1402   CL 97 (L) 03/16/2016 1020   CL 106 05/20/2014 1402   CO2 31 03/16/2016 1020   CO2 22 05/20/2014 1402   BUN 15 03/16/2016 1020   BUN 61 (H) 05/20/2014 1402   CREATININE 2.70 (H) 03/16/2016 1020   CREATININE 2.72 (H) 05/20/2014 1402      Component Value Date/Time   CALCIUM 9.0 03/16/2016 1020   CALCIUM 8.7 (L) 05/20/2014 1402   ALKPHOS 66 03/16/2016 1020   ALKPHOS 90 05/20/2014 1402   AST 31 03/16/2016 1020   AST 23 05/20/2014 1402   ALT 22 03/16/2016 1020   ALT 12 (L) 05/20/2014 1402   BILITOT 0.9 03/16/2016 1020   BILITOT 0.5 05/20/2014 1402       RADIOGRAPHIC STUDIES: I have personally reviewed the radiological images as listed and agreed with the findings in the report. No results found.   ASSESSMENT & PLAN:  Malignant poorly differentiated neuroendocrine carcinoma ([place]) # METASTATIC CARCINOID of ovary; on sandostatin q monthly. Clinically asymptomatic from carcinoid syndrome. No evidence of any progression clinically.   # ANEMIA- /CKD/ on HD- hb 11.8/improved.   # dementia- mild- stable.   # Afin  On coumadin- stable.   # follow up in 3 months/ labs.chromgranin A  # 25 minutes face-to-face with the patient discussing the above plan of care; more than 50% of time spent on prognosis/ natural history; counseling and coordination.    Orders Placed  This Encounter  Procedures  . CBC with Differential/Platelet    Standing Status:   Future    Standing Expiration Date:   09/13/2016  . Comprehensive metabolic panel    Standing Status:   Future    Standing Expiration Date:   09/13/2016  . Chromogranin A    Standing Status:   Future    Standing Expiration Date:   09/13/2016   All questions were answered. The patient knows to call the clinic with any problems, questions or concerns.      Cammie Sickle, MD 03/17/2016 6:10 PM

## 2016-03-16 NOTE — Progress Notes (Signed)
Patient here today for follow up.   

## 2016-03-16 NOTE — Assessment & Plan Note (Addendum)
#   METASTATIC CARCINOID of ovary; on sandostatin q monthly. Clinically asymptomatic from carcinoid syndrome. No evidence of any progression clinically.   # ANEMIA- /CKD/ on HD- hb 11.8/improved.   # dementia- mild- stable.   # Afin  On coumadin- stable.   # follow up in 3 months/ labs.chromgranin A  # 25 minutes face-to-face with the patient discussing the above plan of care; more than 50% of time spent on prognosis/ natural history; counseling and coordination.

## 2016-03-17 LAB — CHROMOGRANIN A: Chromogranin A: 15 nmol/L — ABNORMAL HIGH (ref 0–5)

## 2016-03-21 ENCOUNTER — Encounter
Admission: RE | Disposition: A | Discharge status: Home / Self Care | Payer: Self-pay | Source: Ambulatory Visit | Attending: Vascular Surgery

## 2016-03-21 ENCOUNTER — Ambulatory Visit
Admission: RE | Admit: 2016-03-21 | Discharge: 2016-03-21 | Disposition: A | Discharge status: Home / Self Care | Payer: Medicare Other | Source: Ambulatory Visit | Attending: Vascular Surgery | Admitting: Vascular Surgery

## 2016-03-21 DIAGNOSIS — E785 Hyperlipidemia, unspecified: Secondary | ICD-10-CM | POA: Diagnosis not present

## 2016-03-21 DIAGNOSIS — Z96653 Presence of artificial knee joint, bilateral: Secondary | ICD-10-CM | POA: Diagnosis not present

## 2016-03-21 DIAGNOSIS — Z7902 Long term (current) use of antithrombotics/antiplatelets: Secondary | ICD-10-CM | POA: Insufficient documentation

## 2016-03-21 DIAGNOSIS — Z452 Encounter for adjustment and management of vascular access device: Secondary | ICD-10-CM | POA: Diagnosis not present

## 2016-03-21 DIAGNOSIS — M810 Age-related osteoporosis without current pathological fracture: Secondary | ICD-10-CM | POA: Diagnosis not present

## 2016-03-21 DIAGNOSIS — I4891 Unspecified atrial fibrillation: Secondary | ICD-10-CM | POA: Insufficient documentation

## 2016-03-21 DIAGNOSIS — F0390 Unspecified dementia without behavioral disturbance: Secondary | ICD-10-CM | POA: Diagnosis not present

## 2016-03-21 DIAGNOSIS — I499 Cardiac arrhythmia, unspecified: Secondary | ICD-10-CM | POA: Diagnosis not present

## 2016-03-21 DIAGNOSIS — E039 Hypothyroidism, unspecified: Secondary | ICD-10-CM | POA: Insufficient documentation

## 2016-03-21 DIAGNOSIS — Z823 Family history of stroke: Secondary | ICD-10-CM | POA: Insufficient documentation

## 2016-03-21 DIAGNOSIS — Z8589 Personal history of malignant neoplasm of other organs and systems: Secondary | ICD-10-CM | POA: Diagnosis not present

## 2016-03-21 DIAGNOSIS — Z8673 Personal history of transient ischemic attack (TIA), and cerebral infarction without residual deficits: Secondary | ICD-10-CM | POA: Diagnosis not present

## 2016-03-21 DIAGNOSIS — Z992 Dependence on renal dialysis: Secondary | ICD-10-CM | POA: Diagnosis not present

## 2016-03-21 DIAGNOSIS — N186 End stage renal disease: Secondary | ICD-10-CM | POA: Diagnosis not present

## 2016-03-21 DIAGNOSIS — I509 Heart failure, unspecified: Secondary | ICD-10-CM | POA: Insufficient documentation

## 2016-03-21 DIAGNOSIS — I132 Hypertensive heart and chronic kidney disease with heart failure and with stage 5 chronic kidney disease, or end stage renal disease: Secondary | ICD-10-CM | POA: Insufficient documentation

## 2016-03-21 DIAGNOSIS — Z9049 Acquired absence of other specified parts of digestive tract: Secondary | ICD-10-CM | POA: Diagnosis not present

## 2016-03-21 DIAGNOSIS — Z841 Family history of disorders of kidney and ureter: Secondary | ICD-10-CM | POA: Insufficient documentation

## 2016-03-21 DIAGNOSIS — I340 Nonrheumatic mitral (valve) insufficiency: Secondary | ICD-10-CM | POA: Diagnosis not present

## 2016-03-21 DIAGNOSIS — Z8249 Family history of ischemic heart disease and other diseases of the circulatory system: Secondary | ICD-10-CM | POA: Insufficient documentation

## 2016-03-21 DIAGNOSIS — Z90710 Acquired absence of both cervix and uterus: Secondary | ICD-10-CM | POA: Insufficient documentation

## 2016-03-21 DIAGNOSIS — I2510 Atherosclerotic heart disease of native coronary artery without angina pectoris: Secondary | ICD-10-CM | POA: Insufficient documentation

## 2016-03-21 DIAGNOSIS — Z880 Allergy status to penicillin: Secondary | ICD-10-CM | POA: Diagnosis not present

## 2016-03-21 HISTORY — PX: PERIPHERAL VASCULAR CATHETERIZATION: SHX172C

## 2016-03-21 SURGERY — DIALYSIS/PERMA CATHETER REMOVAL
Anesthesia: Moderate Sedation

## 2016-03-21 MED ORDER — LIDOCAINE-EPINEPHRINE (PF) 2 %-1:200000 IJ SOLN
INTRAMUSCULAR | Status: AC
  Filled 2016-03-21: qty 20

## 2016-03-21 MED ORDER — LIDOCAINE-EPINEPHRINE (PF) 1 %-1:200000 IJ SOLN
INTRAMUSCULAR | Status: DC | PRN
  Administered 2016-03-21: 10 mL

## 2016-03-21 SURGICAL SUPPLY — 2 items
FORCEPS HALSTEAD CVD 5IN STRL (INSTRUMENTS) ×3 IMPLANT
TRAY LACERAT/PLASTIC (MISCELLANEOUS) ×3 IMPLANT

## 2016-03-21 NOTE — Op Note (Signed)
  OPERATIVE NOTE   PROCEDURE: 1. Removal of a right IJ tunneled dialysis catheter  PRE-OPERATIVE DIAGNOSIS: Complication of dialysis catheter, End stage renal disease  POST-OPERATIVE DIAGNOSIS: Same  SURGEON: [name], M.D.  ANESTHESIA: Local anesthetic with 1% lidocaine with epinephrine   ESTIMATED BLOOD LOSS: Minimal   FINDING(S): 1. Catheter intact   SPECIMEN(S):  Catheter  INDICATIONS:   Kerry Walsh is a 83 y.o. female who presents with nonfunctioning right IJ tunneled dialysis catheter and a working left arm AV graft.  The patient has undergone placement of an extremity access which is working and this has been successfully cannulated without difficulty.  therefore is undergoing removal of his tunneled catheter which is no longer needed to avoid septic complications.   DESCRIPTION: After obtaining full informed written consent, the patient was positioned supine. The right IJ tunneled catheter and surrounding area is prepped and draped in a sterile fashion. The cuff was localized by palpation and noted to be less than 3 cm from the exit site. After appropriate timeout is called, 1% lidocaine with epinephrine is infiltrated into the surrounding tissues around the cuff. Small transverse incision is created at the exit site with an 11 blade scalpel and the dissection was carried up along the catheter to expose the cuff of the tunneled catheter.  The catheter cuff is then freed from the surrounding attachments and adhesions. Once the catheter has been freed circumferentially it is removed in 1 piece. Light pressure was held at the base of the neck.   Antibiotic ointment and a sterile dressing is applied to the exit site. Patient tolerated procedure well and there were no complications.  COMPLICATIONS: None  CONDITION: Unchanged  [name], M.D. [facility] Office: [phone]  03/21/2016,3:02 PM

## 2016-03-21 NOTE — H&P (Signed)
[facility] History & Physical Update  The patient was interviewed and re-examined.  The patient's previous History and Physical has been reviewed and is unchanged.  There is no change in the plan of care. We plan to proceed with the scheduled procedure.  [name], MD  03/21/2016, 2:59 PM

## 2016-03-22 ENCOUNTER — Encounter: Payer: Self-pay | Admitting: Vascular Surgery

## 2016-04-17 ENCOUNTER — Inpatient Hospital Stay: Payer: Medicare Other | Attending: Internal Medicine

## 2016-04-17 DIAGNOSIS — C7A098 Malignant carcinoid tumors of other sites: Secondary | ICD-10-CM | POA: Diagnosis not present

## 2016-04-17 DIAGNOSIS — C7A1 Malignant poorly differentiated neuroendocrine tumors: Secondary | ICD-10-CM

## 2016-04-17 DIAGNOSIS — Z79899 Other long term (current) drug therapy: Secondary | ICD-10-CM | POA: Diagnosis not present

## 2016-04-17 MED ORDER — OCTREOTIDE ACETATE 30 MG IM KIT
30.0000 mg | PACK | Freq: Once | INTRAMUSCULAR | Status: AC
  Administered 2016-04-17: 30 mg via INTRAMUSCULAR
  Filled 2016-04-17: qty 1

## 2016-05-15 ENCOUNTER — Inpatient Hospital Stay: Payer: Medicare Other

## 2016-05-17 ENCOUNTER — Inpatient Hospital Stay: Payer: Medicare Other | Attending: Internal Medicine

## 2016-05-17 DIAGNOSIS — C7A1 Malignant poorly differentiated neuroendocrine tumors: Secondary | ICD-10-CM

## 2016-05-17 DIAGNOSIS — C7A098 Malignant carcinoid tumors of other sites: Secondary | ICD-10-CM | POA: Insufficient documentation

## 2016-05-17 DIAGNOSIS — Z79899 Other long term (current) drug therapy: Secondary | ICD-10-CM | POA: Insufficient documentation

## 2016-05-17 MED ORDER — OCTREOTIDE ACETATE 30 MG IM KIT
30.0000 mg | PACK | Freq: Once | INTRAMUSCULAR | Status: AC
  Administered 2016-05-17: 30 mg via INTRAMUSCULAR
  Filled 2016-05-17: qty 1

## 2016-05-24 IMAGING — CR DG CHEST 2V
1 series · 2 of 2 positions shown · non-contrast
Comparison: None.

CLINICAL DATA: Preoperative knee surgery

EXAM:
CHEST  2 VIEW

[Series 1: dxr chest pa (or ap) and lateral · 0.14mm/px · 2 of 2 slices shown]
[im 1/2]
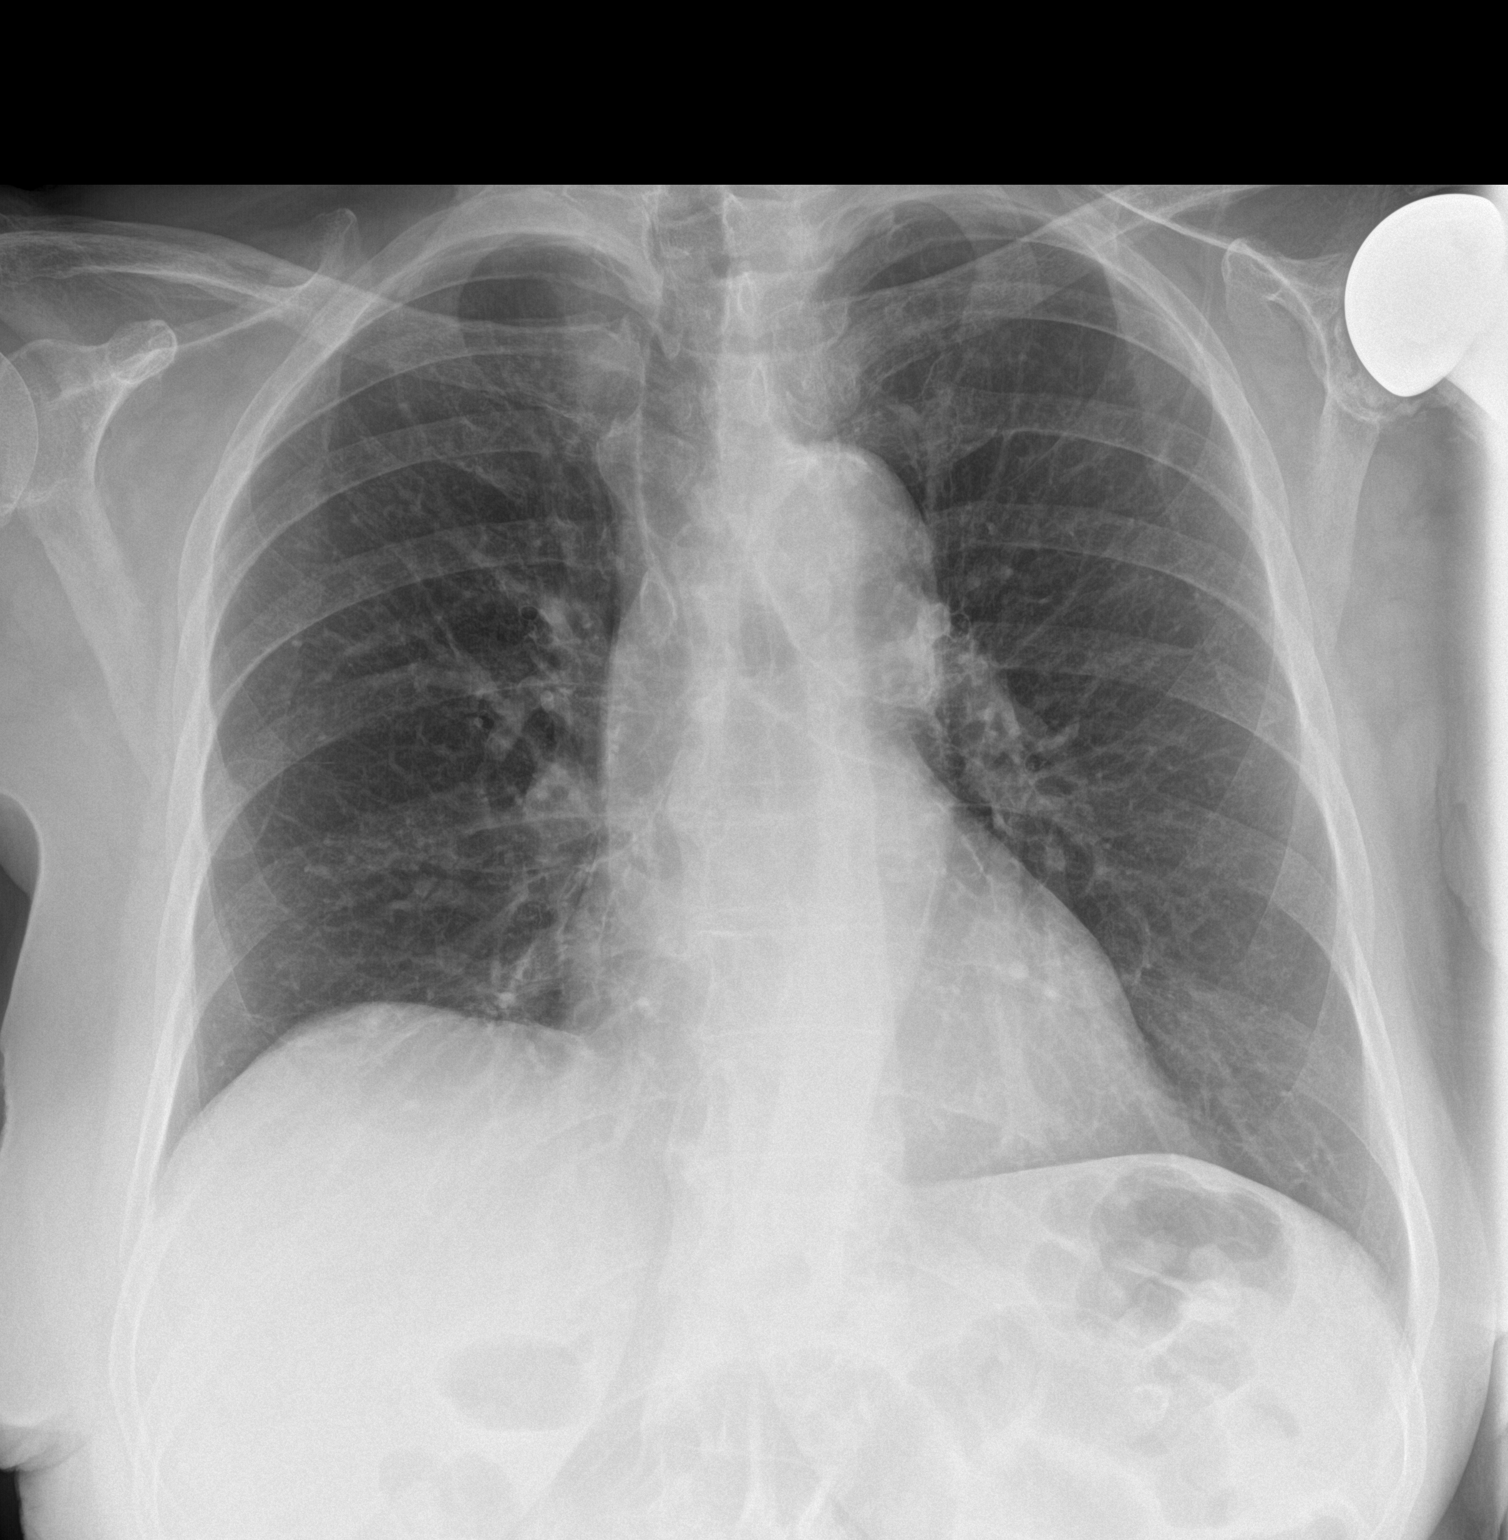
[im 2/2]
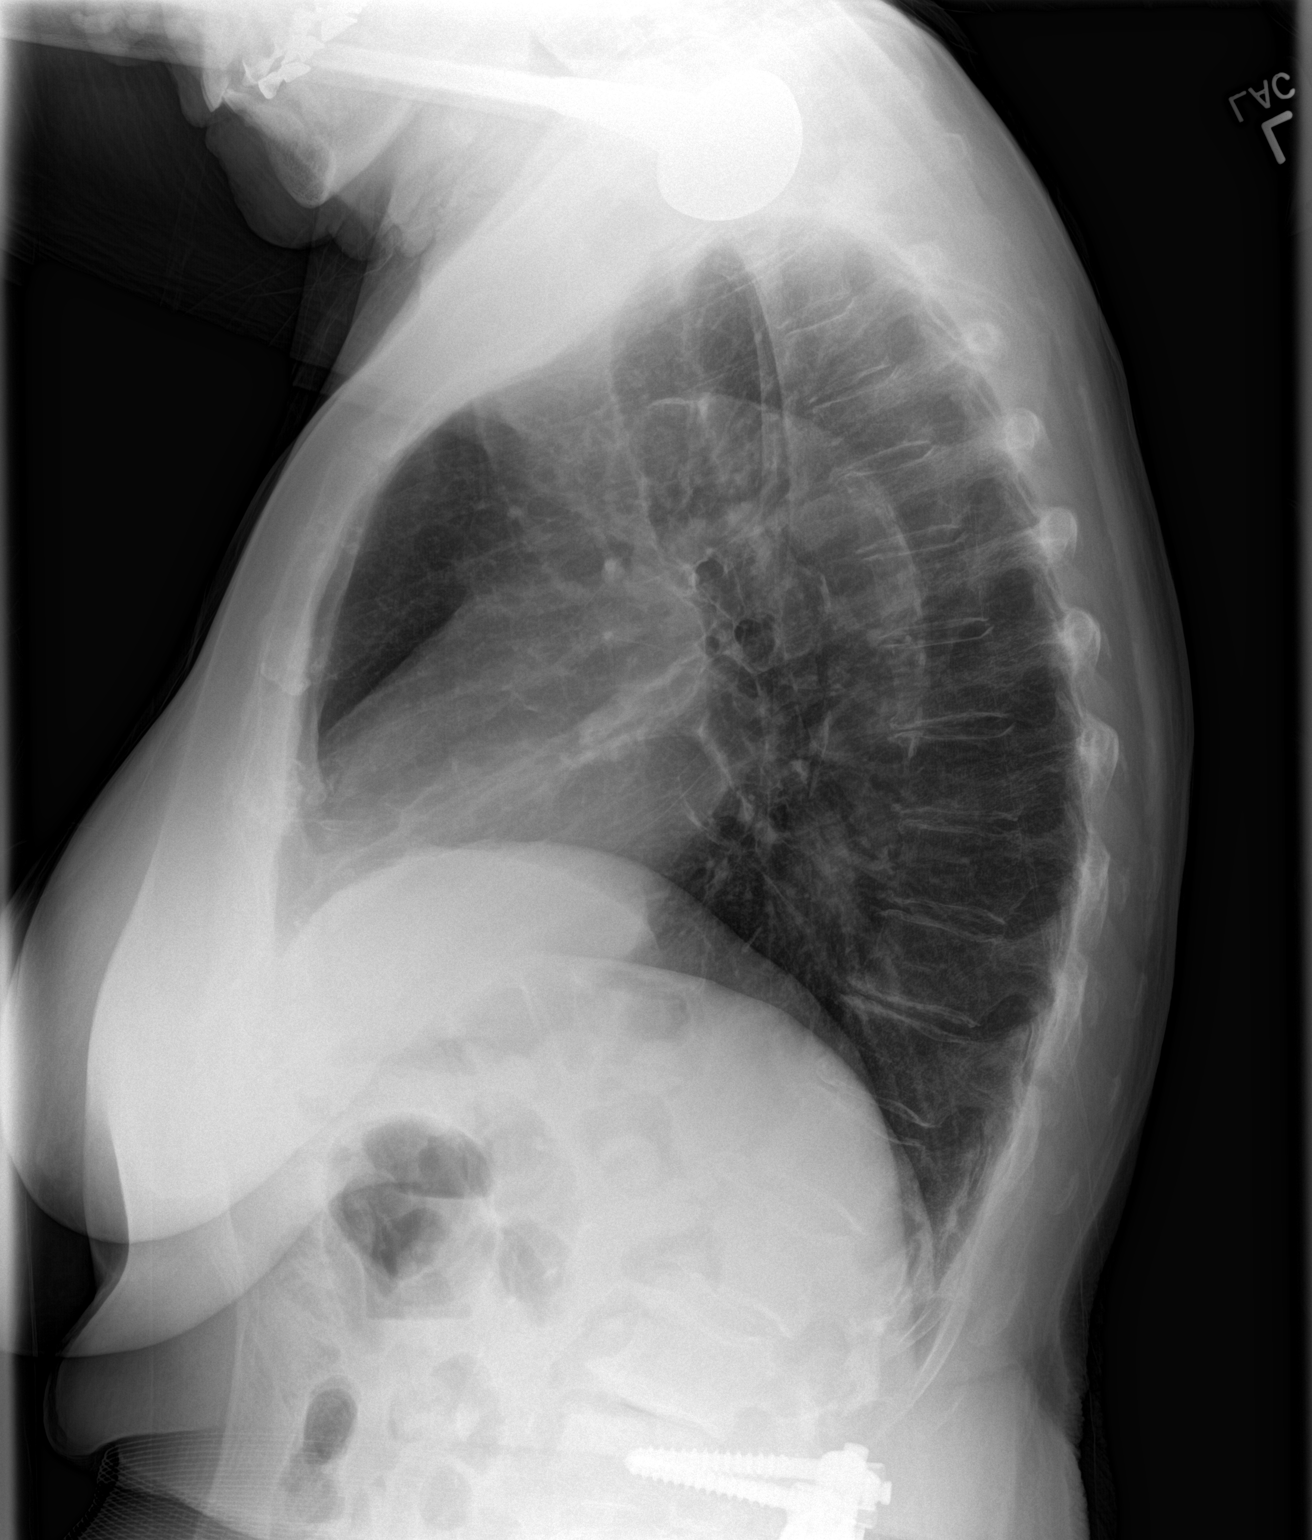

[2 of 2 positions shown; findings below may reference images not displayed]

FINDINGS: Lungs are clear. Heart size and pulmonary vascularity are normal. No
adenopathy. There is atherosclerotic change in the thoracic aorta.
There is a total shoulder replacement on the left. There is
postoperative change in the lumbar spine.
IMPRESSION: No edema or consolidation.

## 2016-05-25 IMAGING — CR DG CHEST 1V PORT
1 series · 1 of 1 positions shown · non-contrast
Comparison: November 27, 2013

CLINICAL DATA: Central catheter placement

EXAM:
PORTABLE CHEST - 1 VIEW

[ap]
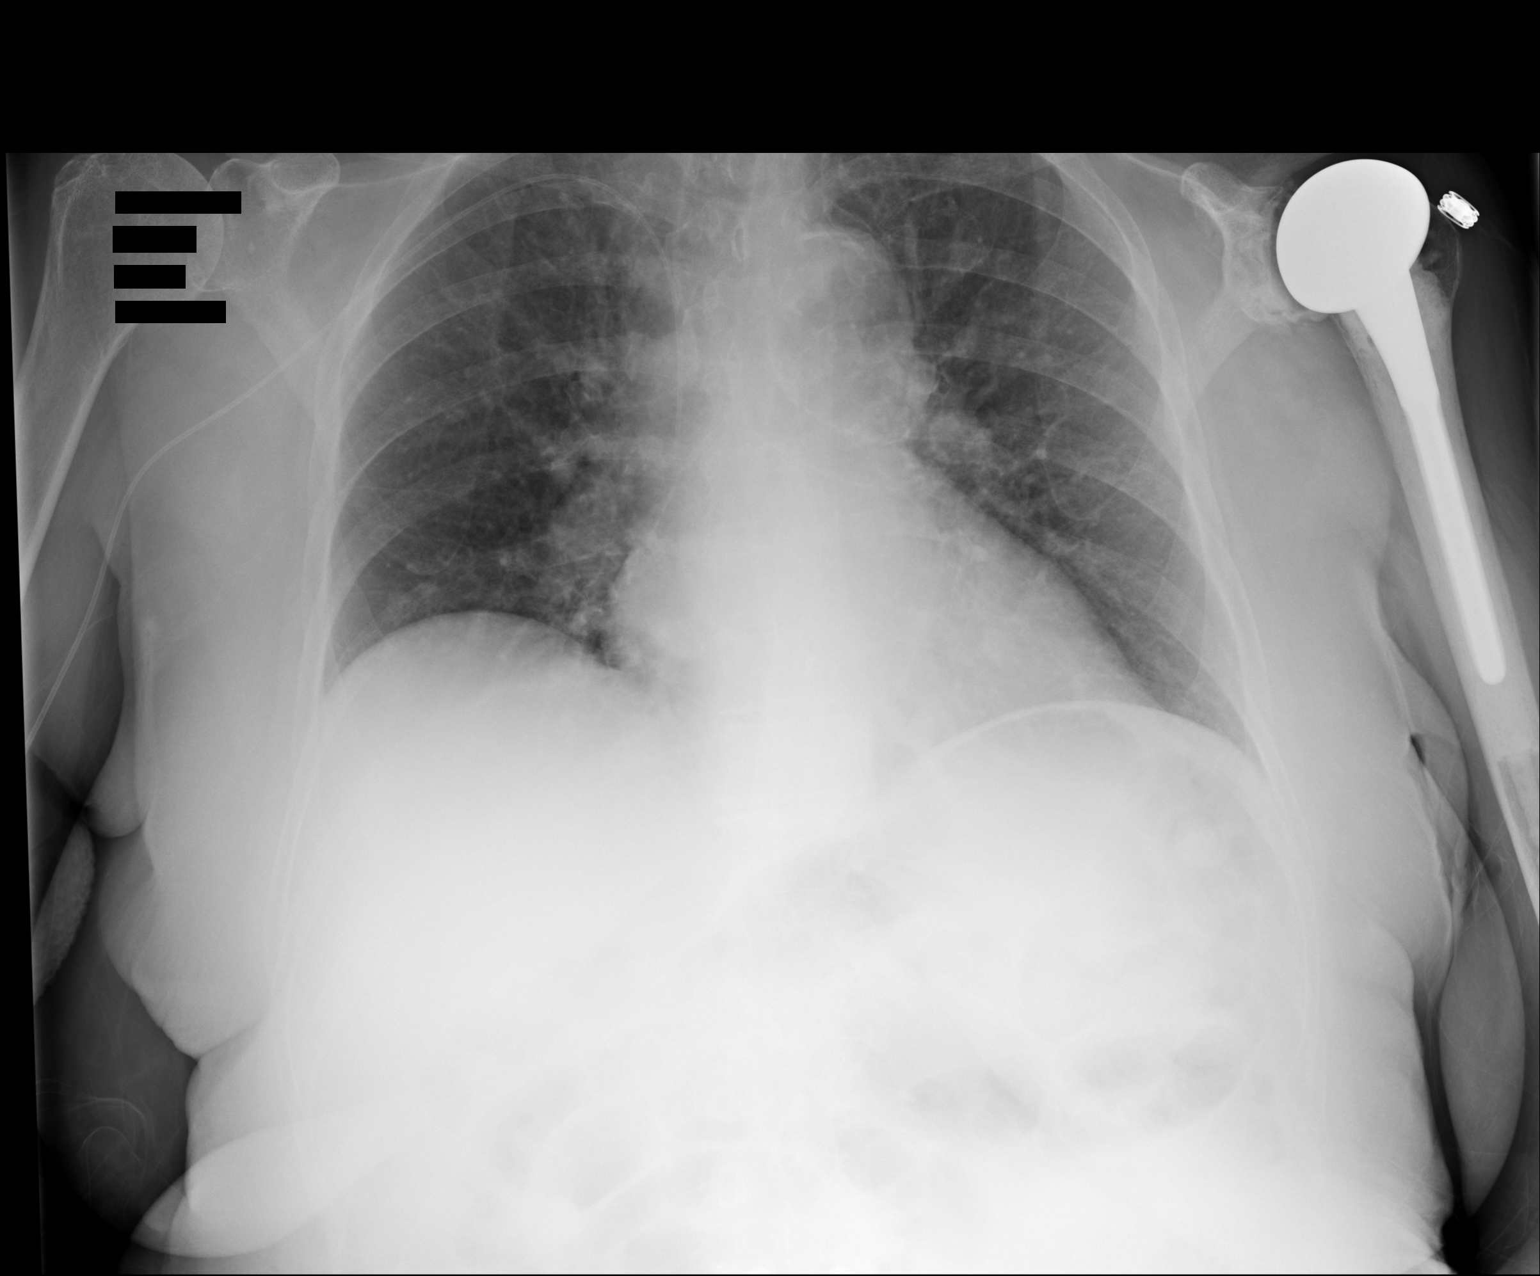

[1 of 1 positions shown; findings below may reference images not displayed]

FINDINGS: Central catheter tip is in the superior vena cava. No pneumothorax.
There is no edema or consolidation. Heart size and pulmonary
vascularity are normal. There is atherosclerotic change in the
aorta. No adenopathy. There is a total shoulder replacement on the
left.
IMPRESSION: Central catheter tip in superior vena cava. No pneumothorax. No
edema or consolidation.

## 2016-06-14 ENCOUNTER — Ambulatory Visit: Payer: Medicare Other | Admitting: Internal Medicine

## 2016-06-14 ENCOUNTER — Other Ambulatory Visit: Payer: Medicare Other

## 2016-06-14 ENCOUNTER — Ambulatory Visit: Payer: Medicare Other

## 2016-06-16 ENCOUNTER — Inpatient Hospital Stay: Payer: Medicare Other

## 2016-06-16 ENCOUNTER — Inpatient Hospital Stay (HOSPITAL_BASED_OUTPATIENT_CLINIC_OR_DEPARTMENT_OTHER): Payer: Medicare Other | Admitting: Internal Medicine

## 2016-06-16 ENCOUNTER — Inpatient Hospital Stay: Payer: Medicare Other | Attending: Internal Medicine

## 2016-06-16 VITALS — BP 120/62 | Temp 97.6°F | Wt 115.1 lb

## 2016-06-16 DIAGNOSIS — F329 Major depressive disorder, single episode, unspecified: Secondary | ICD-10-CM | POA: Insufficient documentation

## 2016-06-16 DIAGNOSIS — I4891 Unspecified atrial fibrillation: Secondary | ICD-10-CM | POA: Diagnosis not present

## 2016-06-16 DIAGNOSIS — F0390 Unspecified dementia without behavioral disturbance: Secondary | ICD-10-CM | POA: Diagnosis not present

## 2016-06-16 DIAGNOSIS — F419 Anxiety disorder, unspecified: Secondary | ICD-10-CM

## 2016-06-16 DIAGNOSIS — Z79899 Other long term (current) drug therapy: Secondary | ICD-10-CM | POA: Insufficient documentation

## 2016-06-16 DIAGNOSIS — Z7901 Long term (current) use of anticoagulants: Secondary | ICD-10-CM | POA: Diagnosis not present

## 2016-06-16 DIAGNOSIS — D631 Anemia in chronic kidney disease: Secondary | ICD-10-CM | POA: Diagnosis not present

## 2016-06-16 DIAGNOSIS — Z992 Dependence on renal dialysis: Secondary | ICD-10-CM | POA: Insufficient documentation

## 2016-06-16 DIAGNOSIS — C7A098 Malignant carcinoid tumors of other sites: Secondary | ICD-10-CM

## 2016-06-16 DIAGNOSIS — E039 Hypothyroidism, unspecified: Secondary | ICD-10-CM | POA: Diagnosis not present

## 2016-06-16 DIAGNOSIS — I2510 Atherosclerotic heart disease of native coronary artery without angina pectoris: Secondary | ICD-10-CM | POA: Diagnosis not present

## 2016-06-16 DIAGNOSIS — N186 End stage renal disease: Secondary | ICD-10-CM | POA: Diagnosis not present

## 2016-06-16 DIAGNOSIS — I129 Hypertensive chronic kidney disease with stage 1 through stage 4 chronic kidney disease, or unspecified chronic kidney disease: Secondary | ICD-10-CM

## 2016-06-16 DIAGNOSIS — C7A1 Malignant poorly differentiated neuroendocrine tumors: Secondary | ICD-10-CM

## 2016-06-16 DIAGNOSIS — I509 Heart failure, unspecified: Secondary | ICD-10-CM

## 2016-06-16 LAB — CBC WITH DIFFERENTIAL/PLATELET
Basophils Absolute: 0.1 10*3/uL (ref 0–0.1)
Basophils Relative: 3 %
EOS PCT: 3 %
Eosinophils Absolute: 0.1 10*3/uL (ref 0–0.7)
HCT: 34.4 % — ABNORMAL LOW (ref 35.0–47.0)
Hemoglobin: 11.7 g/dL — ABNORMAL LOW (ref 12.0–16.0)
LYMPHS ABS: 1 10*3/uL (ref 1.0–3.6)
LYMPHS PCT: 24 %
MCH: 30.7 pg (ref 26.0–34.0)
MCHC: 33.9 g/dL (ref 32.0–36.0)
MCV: 90.5 fL (ref 80.0–100.0)
MONOS PCT: 6 %
Monocytes Absolute: 0.3 10*3/uL (ref 0.2–0.9)
Neutro Abs: 2.8 10*3/uL (ref 1.4–6.5)
Neutrophils Relative %: 64 %
PLATELETS: 146 10*3/uL — AB (ref 150–440)
RBC: 3.81 MIL/uL (ref 3.80–5.20)
RDW: 15 % — ABNORMAL HIGH (ref 11.5–14.5)
WBC: 4.3 10*3/uL (ref 3.6–11.0)

## 2016-06-16 LAB — COMPREHENSIVE METABOLIC PANEL
ALK PHOS: 111 U/L (ref 38–126)
ALT: 35 U/L (ref 14–54)
ANION GAP: 8 (ref 5–15)
AST: 39 U/L (ref 15–41)
Albumin: 4 g/dL (ref 3.5–5.0)
BUN: 31 mg/dL — ABNORMAL HIGH (ref 6–20)
CALCIUM: 9.3 mg/dL (ref 8.9–10.3)
CO2: 28 mmol/L (ref 22–32)
CREATININE: 3.61 mg/dL — AB (ref 0.44–1.00)
Chloride: 98 mmol/L — ABNORMAL LOW (ref 101–111)
GFR, EST AFRICAN AMERICAN: 12 mL/min — AB (ref >60)
GFR, EST NON AFRICAN AMERICAN: 10 mL/min — AB (ref >60)
Glucose, Bld: 126 mg/dL — ABNORMAL HIGH (ref 65–99)
Potassium: 3.6 mmol/L (ref 3.5–5.1)
Sodium: 134 mmol/L — ABNORMAL LOW (ref 135–145)
Total Bilirubin: 0.9 mg/dL (ref 0.3–1.2)
Total Protein: 6.5 g/dL (ref 6.5–8.1)

## 2016-06-16 MED ORDER — OCTREOTIDE ACETATE 30 MG IM KIT
30.0000 mg | PACK | Freq: Once | INTRAMUSCULAR | Status: AC
  Administered 2016-06-16: 30 mg via INTRAMUSCULAR
  Filled 2016-06-16: qty 1

## 2016-06-16 NOTE — Assessment & Plan Note (Addendum)
#   METASTATIC CARCINOID of ovary; on sandostatin q monthly. Clinically asymptomatic from carcinoid syndrome. No evidence of any progression clinically. Patient has not had imaging in the last many years. Discussed regarding imaging with the patient and daughter- decline. I think it's reasonable given her age/comorbidities to watch her clinically at this time. If any concerns for progression tend recommend a gallium PET scan.  # ANEMIA- /CKD/ on HD- hb 11.8/improved. On PO iron.   # dementia- mild- stable.   # Afib  On coumadin- stable. No falls.   # follow up in 3 months/ labs.chromgranin A continue Sandostatin monthly.

## 2016-06-16 NOTE — Progress Notes (Signed)
Patient here today for follow up.   

## 2016-06-16 NOTE — Progress Notes (Signed)
[place] OFFICE PROGRESS NOTE  Patient Care Team: Kirk Ruths, MD as PCP - General (Internal Medicine)  Cancer Staging No matching staging information was found for the patient.   Oncology History   1. Ovarian cystic mass underwent robotic bilateral salpingo-oophorectomy in April of 2011 [in ? [place]] Neuroendocrine tumor, poorly differentiated carcinoid 2. Octreotide scan revealed mets to retro peritoneal lymph node. Patient underwent lymph node dissection in July of 2011. 3. Patient has been started on Sandostatin LAR from September of 2012 because of symptoms of carcinoid syndrome.  # dementia- mild; ESRD- on HD [Dr.Kolluru]     Malignant poorly differentiated neuroendocrine carcinoma ([place])     INTERVAL HISTORY:Patient is a fair historian at best; accompanied by her daughter.  Kerry Walsh 83 y.o.  female pleasant patient above history of Carcinoid of the ovary is here for follow-up; patient is currently on Sandostatin.   Patient continues on dialysis without any major problems. No infectious episodes of admission the hospital. Patient denies diarrhea. Denies any wheezing or shortness of breath or cough. No swelling in the legs. Appetite is good. No weight loss. No falls. Patient continues on Coumadin. No bleeding.  REVIEW OF SYSTEMS:  A complete 10 point review of system is done which is negative except mentioned above/history of present illness.   PAST MEDICAL HISTORY :  Past Medical History:  Diagnosis Date  . Anemia   . Anxiety   . Arthritis   . Atrial fibrillation ([place])   . Cancer [facility])    Carcinoid Tumor  . CHF (congestive heart failure) ([place])   . Chronic kidney disease    CKD with last known creatinine fo 2.4 in Dec 2015  . Coronary artery disease   . Dementia   . Depression   . Dialysis patient [facility])    Mon. Wed. Fri.  . Dysrhythmia    Atrial Fibrillation  . Hypertension   . Hypothyroidism   . Malignant poorly  differentiated neuroendocrine carcinoma ([place]) 08/12/2014  . Mitral valve regurgitation   . Osteoporosis   . Stroke ([place])    No residual neurological deficits    PAST SURGICAL HISTORY :   Past Surgical History:  Procedure Laterality Date  . ABDOMINAL HYSTERECTOMY    . AV FISTULA PLACEMENT Left 01/19/2016   Procedure: INSERTION OF ARTERIOVENOUS (AV) GORE-TEX GRAFT ARM ( BRACH / AXILLARY );  Surgeon: Katha Cabal, MD;  Location: [facility] ORS;  Service: Vascular;  Laterality: Left;  . BACK SURGERY    . CHOLECYSTECTOMY    . EYE SURGERY Bilateral    Cataract Extraction with IOL  . JOINT REPLACEMENT Bilateral    Knee replacement surgery- bilateral  . PERIPHERAL VASCULAR CATHETERIZATION N/A 07/27/2015   Procedure: Dialysis/Perma Catheter Insertion;  Surgeon: Katha Cabal, MD;  Location: [place] [facility];  Service: Cardiovascular;  Laterality: N/A;  . PERIPHERAL VASCULAR CATHETERIZATION N/A 03/21/2016   Procedure: Dialysis/Perma Catheter Removal;  Surgeon: Katha Cabal, MD;  Location: [place] CV LAB;  Service: Cardiovascular;  Laterality: N/A;  . SHOULDER SURGERY Bilateral     FAMILY HISTORY :   Family History  Problem Relation Age of Onset  . CAD Father   . Stroke Father   . Kidney failure Mother     SOCIAL HISTORY:   Social History  Substance Use Topics  . Smoking status: Never Smoker  . Smokeless tobacco: Never Used  . Alcohol use No    ALLERGIES:  is allergic to penicillins.  MEDICATIONS:  Current Outpatient Prescriptions  Medication Sig Dispense Refill  . acetaminophen (TYLENOL) 500 MG tablet Take 500-1,000 mg by mouth every 6 (six) hours as needed (for pain).    Marland Kitchen amLODipine (NORVASC) 5 MG tablet Take 5 mg by mouth daily.     Marland Kitchen atorvastatin (LIPITOR) 20 MG tablet Take 20 mg by mouth daily.     . citalopram (CELEXA) 20 MG tablet Take 20 mg by mouth daily.     Marland Kitchen levothyroxine (SYNTHROID, LEVOTHROID) 88 MCG tablet Take 88 mcg by mouth daily before  breakfast.     . metoprolol (LOPRESSOR) 50 MG tablet Take 1 tablet (50 mg total) by mouth 2 (two) times daily. (Patient taking differently: Take 50 mg by mouth every evening. ) 60 tablet 0  . multivitamin (RENA-VIT) TABS tablet Take 1 tablet by mouth daily.    Marland Kitchen torsemide (DEMADEX) 10 MG tablet Take 10 mg by mouth daily.     Marland Kitchen warfarin (COUMADIN) 2.5 MG tablet Take 2.5 mg by mouth every other day.     . warfarin (COUMADIN) 5 MG tablet Take 5 mg by mouth every other day.      No current facility-administered medications for this visit.     PHYSICAL EXAMINATION: ECOG PERFORMANCE STATUS: 0 - Asymptomatic  BP 120/62 (BP Location: Right Arm, Patient Position: Sitting)   Temp 97.6 F (36.4 C) (Tympanic)   Wt 115 lb 2 oz (52.2 kg)   BMI 21.06 kg/m   Filed Weights   06/16/16 1049  Weight: 115 lb 2 oz (52.2 kg)    GENERAL: Frail-appearing thin built Caucasian female patient is walking herself; Alert, no distress and comfortable. Accompanied by daughter. EYES: no pallor or icterus OROPHARYNX: no thrush or ulceration; good dentition  NECK: supple, no masses felt LYMPH:  no palpable lymphadenopathy in the cervical, axillary or inguinal regions LUNGS: clear to auscultation and  No wheeze or crackles HEART/CVS: regular rate & rhythm and no murmurs; No lower extremity edema ABDOMEN:abdomen soft, non-tender and normal bowel sounds Musculoskeletal:no cyanosis of digits and no clubbing  PSYCH: alert & oriented x 3 with fluent speech NEURO: no focal motor/sensory deficits SKIN:  no rashes or significant lesions  LABORATORY DATA:  I have reviewed the data as listed    Component Value Date/Time   NA 134 (L) 06/16/2016 1012   NA 135 05/20/2014 1402   K 3.6 06/16/2016 1012   K 4.5 05/20/2014 1402   CL 98 (L) 06/16/2016 1012   CL 106 05/20/2014 1402   CO2 28 06/16/2016 1012   CO2 22 05/20/2014 1402   GLUCOSE 126 (H) 06/16/2016 1012   GLUCOSE 91 05/20/2014 1402   BUN 31 (H) 06/16/2016 1012    BUN 61 (H) 05/20/2014 1402   CREATININE 3.61 (H) 06/16/2016 1012   CREATININE 2.72 (H) 05/20/2014 1402   CALCIUM 9.3 06/16/2016 1012   CALCIUM 8.7 (L) 05/20/2014 1402   PROT 6.5 06/16/2016 1012   PROT 7.0 05/20/2014 1402   ALBUMIN 4.0 06/16/2016 1012   ALBUMIN 4.1 05/20/2014 1402   AST 39 06/16/2016 1012   AST 23 05/20/2014 1402   ALT 35 06/16/2016 1012   ALT 12 (L) 05/20/2014 1402   ALKPHOS 111 06/16/2016 1012   ALKPHOS 90 05/20/2014 1402   BILITOT 0.9 06/16/2016 1012   BILITOT 0.5 05/20/2014 1402   GFRNONAA 10 (L) 06/16/2016 1012   GFRNONAA 15 (L) 05/20/2014 1402   GFRAA 12 (L) 06/16/2016 1012   GFRAA 18 (L) 05/20/2014 1402  No results found for: SPEP, UPEP  Lab Results  Component Value Date   WBC 4.3 06/16/2016   NEUTROABS 2.8 06/16/2016   HGB 11.7 (L) 06/16/2016   HCT 34.4 (L) 06/16/2016   MCV 90.5 06/16/2016   PLT 146 (L) 06/16/2016      Chemistry      Component Value Date/Time   NA 134 (L) 06/16/2016 1012   NA 135 05/20/2014 1402   K 3.6 06/16/2016 1012   K 4.5 05/20/2014 1402   CL 98 (L) 06/16/2016 1012   CL 106 05/20/2014 1402   CO2 28 06/16/2016 1012   CO2 22 05/20/2014 1402   BUN 31 (H) 06/16/2016 1012   BUN 61 (H) 05/20/2014 1402   CREATININE 3.61 (H) 06/16/2016 1012   CREATININE 2.72 (H) 05/20/2014 1402      Component Value Date/Time   CALCIUM 9.3 06/16/2016 1012   CALCIUM 8.7 (L) 05/20/2014 1402   ALKPHOS 111 06/16/2016 1012   ALKPHOS 90 05/20/2014 1402   AST 39 06/16/2016 1012   AST 23 05/20/2014 1402   ALT 35 06/16/2016 1012   ALT 12 (L) 05/20/2014 1402   BILITOT 0.9 06/16/2016 1012   BILITOT 0.5 05/20/2014 1402       RADIOGRAPHIC STUDIES: I have personally reviewed the radiological images as listed and agreed with the findings in the report. No results found.   ASSESSMENT & PLAN:  Carcinoid tumor of ovary, malignant ([place]) # METASTATIC CARCINOID of ovary; on sandostatin q monthly. Clinically asymptomatic from carcinoid  syndrome. No evidence of any progression clinically. Patient has not had imaging in the last many years. Discussed regarding imaging with the patient and daughter- decline. I think it's reasonable given her age/comorbidities to watch her clinically at this time. If any concerns for progression tend recommend a gallium PET scan.  # ANEMIA- /CKD/ on HD- hb 11.8/improved. On PO iron.   # dementia- mild- stable.   # Afib  On coumadin- stable. No falls.   # follow up in 3 months/ labs.chromgranin A continue Sandostatin monthly.    Orders Placed This Encounter  Procedures  . CBC with Differential/Platelet    Standing Status:   Future    Standing Expiration Date:   06/16/2017  . Comprehensive metabolic panel    Standing Status:   Future    Standing Expiration Date:   06/16/2017   All questions were answered. The patient knows to call the clinic with any problems, questions or concerns.      Cammie Sickle, MD 06/16/2016 1:01 PM

## 2016-06-20 LAB — CHROMOGRANIN A: Chromogranin A: 15 nmol/L — ABNORMAL HIGH (ref 0–5)

## 2016-07-13 ENCOUNTER — Inpatient Hospital Stay: Payer: Medicare Other | Attending: Internal Medicine

## 2016-07-13 DIAGNOSIS — Z79899 Other long term (current) drug therapy: Secondary | ICD-10-CM | POA: Diagnosis not present

## 2016-07-13 DIAGNOSIS — C7A098 Malignant carcinoid tumors of other sites: Secondary | ICD-10-CM | POA: Diagnosis present

## 2016-07-13 DIAGNOSIS — C7A1 Malignant poorly differentiated neuroendocrine tumors: Secondary | ICD-10-CM

## 2016-07-13 MED ORDER — OCTREOTIDE ACETATE 30 MG IM KIT
30.0000 mg | PACK | Freq: Once | INTRAMUSCULAR | Status: AC
  Administered 2016-07-13: 30 mg via INTRAMUSCULAR
  Filled 2016-07-13: qty 1

## 2016-07-14 ENCOUNTER — Inpatient Hospital Stay: Payer: Medicare Other

## 2016-08-03 ENCOUNTER — Ambulatory Visit (INDEPENDENT_AMBULATORY_CARE_PROVIDER_SITE_OTHER): Payer: Medicare Other

## 2016-08-03 ENCOUNTER — Encounter (INDEPENDENT_AMBULATORY_CARE_PROVIDER_SITE_OTHER): Payer: Self-pay | Admitting: Vascular Surgery

## 2016-08-03 ENCOUNTER — Ambulatory Visit (INDEPENDENT_AMBULATORY_CARE_PROVIDER_SITE_OTHER): Payer: Medicare Other | Admitting: Vascular Surgery

## 2016-08-03 VITALS — BP 143/65 | HR 72 | Resp 14 | Ht 63.0 in | Wt 115.0 lb

## 2016-08-03 DIAGNOSIS — I482 Chronic atrial fibrillation, unspecified: Secondary | ICD-10-CM

## 2016-08-03 DIAGNOSIS — Z992 Dependence on renal dialysis: Secondary | ICD-10-CM

## 2016-08-03 DIAGNOSIS — N186 End stage renal disease: Secondary | ICD-10-CM

## 2016-08-03 DIAGNOSIS — E785 Hyperlipidemia, unspecified: Secondary | ICD-10-CM

## 2016-08-03 DIAGNOSIS — I10 Essential (primary) hypertension: Secondary | ICD-10-CM

## 2016-08-06 NOTE — Progress Notes (Signed)
MRN : [id number]  Kerry Walsh is a 83 y.o. (1925/07/16) female who presents with chief complaint of  Chief Complaint  Patient presents with  . Re-evaluation    6 month HDA  .  History of Present Illness: The patient returns to the office for followup of their dialysis access. The function of the access has been stable. The patient denies increased bleeding time or increased recirculation. Patient denies difficulty with cannulation. The patient denies hand pain or other symptoms consistent with steal phenomena.  No significant arm swelling.  The patient denies redness or swelling at the access site. The patient denies fever or chills at home or while on dialysis.  The patient denies amaurosis fugax or recent TIA symptoms. There are no recent neurological changes noted. The patient denies claudication symptoms or rest pain symptoms. The patient denies history of DVT, PE or superficial thrombophlebitis. The patient denies recent episodes of angina or shortness of breath.     No outpatient prescriptions have been marked as taking for the 08/03/16 encounter (Office Visit) with Delana Meyer, Dolores Lory, MD.    Past Medical History:  Diagnosis Date  . Anemia   . Anxiety   . Arthritis   . Atrial fibrillation ([place])   . Cancer [facility])    Carcinoid Tumor  . CHF (congestive heart failure) ([place])   . Chronic kidney disease    CKD with last known creatinine fo 2.4 in Dec 2015  . Coronary artery disease   . Dementia   . Depression   . Dialysis patient [facility])    Mon. Wed. Fri.  . Dysrhythmia    Atrial Fibrillation  . Hypertension   . Hypothyroidism   . Malignant poorly differentiated neuroendocrine carcinoma ([place]) 08/12/2014  . Mitral valve regurgitation   . Osteoporosis   . Stroke [facility])    No residual neurological deficits    Past Surgical History:  Procedure Laterality Date  . ABDOMINAL HYSTERECTOMY    . AV FISTULA PLACEMENT Left 01/19/2016   Procedure: INSERTION OF  ARTERIOVENOUS (AV) GORE-TEX GRAFT ARM ( BRACH / AXILLARY );  Surgeon: Katha Cabal, MD;  Location: [facility] ORS;  Service: Vascular;  Laterality: Left;  . BACK SURGERY    . CHOLECYSTECTOMY    . EYE SURGERY Bilateral    Cataract Extraction with IOL  . JOINT REPLACEMENT Bilateral    Knee replacement surgery- bilateral  . PERIPHERAL VASCULAR CATHETERIZATION N/A 07/27/2015   Procedure: Dialysis/Perma Catheter Insertion;  Surgeon: Katha Cabal, MD;  Location: [place] CV LAB;  Service: Cardiovascular;  Laterality: N/A;  . PERIPHERAL VASCULAR CATHETERIZATION N/A 03/21/2016   Procedure: Dialysis/Perma Catheter Removal;  Surgeon: Katha Cabal, MD;  Location: [place] CV LAB;  Service: Cardiovascular;  Laterality: N/A;  . SHOULDER SURGERY Bilateral     Social History Social History  Substance Use Topics  . Smoking status: Never Smoker  . Smokeless tobacco: Never Used  . Alcohol use No    Family History Family History  Problem Relation Age of Onset  . CAD Father   . Stroke Father   . Kidney failure Mother     Allergies  Allergen Reactions  . Penicillins Rash    Has patient had a PCN reaction causing immediate rash, facial/tongue/throat swelling, SOB or lightheadedness with hypotension: Yes Has patient had a PCN reaction causing severe rash involving mucus membranes or skin necrosis: No Has patient had a PCN reaction that required hospitalization No Has patient had a PCN reaction occurring  within the last 10 years: No If all of the above answers are "NO", then may proceed with Cephalosporin use.      REVIEW OF SYSTEMS (Negative unless checked)  Constitutional: [] Weight loss  [] Fever  [] Chills Cardiac: [] Chest pain   [] Chest pressure   [] Palpitations   [] Shortness of breath when laying flat   [] Shortness of breath with exertion. Vascular:  [] Pain in legs with walking   [] Pain in legs at rest  [] History of DVT   [] Phlebitis   [x] Swelling in legs   [] Varicose veins    [] Non-healing ulcers Pulmonary:   [] Uses home oxygen   [] Productive cough   [] Hemoptysis   [] Wheeze  [] COPD   [] Asthma Neurologic:  [] Dizziness   [] Seizures   [] History of stroke   [] History of TIA  [] Aphasia   [] Vissual changes   [] Weakness or numbness in arm   [] Weakness or numbness in leg Musculoskeletal:   [] Joint swelling   [] Joint pain   [] Low back pain Hematologic:  [] Easy bruising  [] Easy bleeding   [] Hypercoagulable state   [] Anemic Gastrointestinal:  [] Diarrhea   [] Vomiting  [] Gastroesophageal reflux/heartburn   [] Difficulty swallowing. Genitourinary:  [x] Chronic kidney disease   [] Difficult urination  [] Frequent urination   [] Blood in urine Skin:  [] Rashes   [] Ulcers  Psychological:  [] History of anxiety   []  History of major depression.  Physical Examination  Vitals:   08/03/16 1433  BP: (!) 143/65  Pulse: 72  Resp: 14  Weight: 115 lb (52.2 kg)  Height: 5\' 3"  (1.6 m)   Body mass index is 20.37 kg/m. Gen: WD/WN, NAD Head: [place]/AT, No temporalis wasting.  Ear/Nose/Throat: Hearing grossly intact, nares w/o erythema or drainage Eyes: PER, EOMI, sclera nonicteric.  Neck: Supple, no large masses.   Pulmonary:  Good air movement, no audible wheezing bilaterally, no use of accessory muscles.  Cardiac: RRR, no JVD Vascular:  Left arm AV access good thrill good bruit Vessel Right Left  Radial Palpable Palpable  Ulnar Palpable Palpable  Brachial Palpable Palpable  Gastrointestinal: Non-distended. No guarding/no peritoneal signs.  Musculoskeletal: M/S 5/5 throughout.  No deformity or atrophy.  Neurologic: CN 2-12 intact. Symmetrical.  Speech is fluent. Motor exam as listed above. Psychiatric: Judgment intact, Mood & affect appropriate for pt's clinical situation. Dermatologic: No rashes or ulcers noted.  No changes consistent with cellulitis. Lymph : No lichenification or skin changes of chronic lymphedema.  CBC Lab Results  Component Value Date   WBC 4.3 06/16/2016   HGB  11.7 (L) 06/16/2016   HCT 34.4 (L) 06/16/2016   MCV 90.5 06/16/2016   PLT 146 (L) 06/16/2016    BMET    Component Value Date/Time   NA 134 (L) 06/16/2016 1012   NA 135 05/20/2014 1402   K 3.6 06/16/2016 1012   K 4.5 05/20/2014 1402   CL 98 (L) 06/16/2016 1012   CL 106 05/20/2014 1402   CO2 28 06/16/2016 1012   CO2 22 05/20/2014 1402   GLUCOSE 126 (H) 06/16/2016 1012   GLUCOSE 91 05/20/2014 1402   BUN 31 (H) 06/16/2016 1012   BUN 61 (H) 05/20/2014 1402   CREATININE 3.61 (H) 06/16/2016 1012   CREATININE 2.72 (H) 05/20/2014 1402   CALCIUM 9.3 06/16/2016 1012   CALCIUM 8.7 (L) 05/20/2014 1402   GFRNONAA 10 (L) 06/16/2016 1012   GFRNONAA 15 (L) 05/20/2014 1402   GFRAA 12 (L) 06/16/2016 1012   GFRAA 18 (L) 05/20/2014 1402   CrCl cannot be calculated (Patient's most recent  lab result is older than the maximum 21 days allowed.).  COAG Lab Results  Component Value Date   INR 1.28 01/19/2016   INR 2.21 01/11/2016   INR 0.98 09/03/2015    Radiology No results found.  Assessment/Plan 1. ESRD on dialysis [facility]) Recommend:  The patient is doing well and currently has adequate dialysis access. The patient's dialysis center is not reporting any access issues. Flow pattern is stable when compared to the prior ultrasound.  The patient should have a duplex ultrasound of the dialysis access in 6 months. The patient will follow-up with me in the office after each ultrasound    - VAS [place] (AVF,AVG); Future  2. Hyperlipidemia, unspecified hyperlipidemia type Continue statin as ordered and reviewed, no changes at this time   3. Essential (primary) hypertension Continue antihypertensive medications as already ordered, these medications have been reviewed and there are no changes at this time.   4. Chronic atrial fibrillation ([name]) Continue antiarrhythmia medications as already ordered, these medications have been reviewed and there are no changes at this  time.  Continue anticoagulation as ordered by Cardiology Service     [name], MD  08/06/2016 5:12 PM

## 2016-08-18 ENCOUNTER — Inpatient Hospital Stay: Payer: Medicare Other | Attending: Hematology and Oncology

## 2016-08-18 VITALS — BP 134/65 | HR 80 | Resp 20

## 2016-08-18 DIAGNOSIS — C7A098 Malignant carcinoid tumors of other sites: Secondary | ICD-10-CM | POA: Diagnosis present

## 2016-08-18 DIAGNOSIS — C7A1 Malignant poorly differentiated neuroendocrine tumors: Secondary | ICD-10-CM

## 2016-08-18 DIAGNOSIS — Z79899 Other long term (current) drug therapy: Secondary | ICD-10-CM | POA: Diagnosis not present

## 2016-08-18 MED ORDER — OCTREOTIDE ACETATE 30 MG IM KIT
30.0000 mg | PACK | Freq: Once | INTRAMUSCULAR | Status: AC
  Administered 2016-08-18: 30 mg via INTRAMUSCULAR

## 2016-08-31 ENCOUNTER — Encounter (INDEPENDENT_AMBULATORY_CARE_PROVIDER_SITE_OTHER): Payer: Medicare Other

## 2016-08-31 ENCOUNTER — Ambulatory Visit (INDEPENDENT_AMBULATORY_CARE_PROVIDER_SITE_OTHER): Payer: Medicare Other | Admitting: Vascular Surgery

## 2016-09-08 ENCOUNTER — Emergency Department
Admission: EM | Admit: 2016-09-08 | Discharge: 2016-09-08 | Disposition: A | Discharge status: Home / Self Care | Payer: Medicare Other | Attending: Emergency Medicine | Admitting: Emergency Medicine

## 2016-09-08 ENCOUNTER — Inpatient Hospital Stay: Payer: Medicare Other

## 2016-09-08 ENCOUNTER — Emergency Department: Payer: Medicare Other

## 2016-09-08 ENCOUNTER — Inpatient Hospital Stay: Payer: Medicare Other | Attending: Hematology and Oncology

## 2016-09-08 ENCOUNTER — Inpatient Hospital Stay (HOSPITAL_BASED_OUTPATIENT_CLINIC_OR_DEPARTMENT_OTHER): Payer: Medicare Other | Admitting: Internal Medicine

## 2016-09-08 VITALS — BP 150/76 | HR 90 | Temp 97.8°F | Resp 20 | Ht 63.0 in | Wt 114.8 lb

## 2016-09-08 DIAGNOSIS — I10 Essential (primary) hypertension: Secondary | ICD-10-CM | POA: Insufficient documentation

## 2016-09-08 DIAGNOSIS — D649 Anemia, unspecified: Secondary | ICD-10-CM | POA: Diagnosis not present

## 2016-09-08 DIAGNOSIS — Y939 Activity, unspecified: Secondary | ICD-10-CM | POA: Insufficient documentation

## 2016-09-08 DIAGNOSIS — I2510 Atherosclerotic heart disease of native coronary artery without angina pectoris: Secondary | ICD-10-CM | POA: Diagnosis not present

## 2016-09-08 DIAGNOSIS — Z79899 Other long term (current) drug therapy: Secondary | ICD-10-CM | POA: Insufficient documentation

## 2016-09-08 DIAGNOSIS — I129 Hypertensive chronic kidney disease with stage 1 through stage 4 chronic kidney disease, or unspecified chronic kidney disease: Secondary | ICD-10-CM | POA: Diagnosis not present

## 2016-09-08 DIAGNOSIS — M129 Arthropathy, unspecified: Secondary | ICD-10-CM | POA: Insufficient documentation

## 2016-09-08 DIAGNOSIS — F419 Anxiety disorder, unspecified: Secondary | ICD-10-CM | POA: Diagnosis not present

## 2016-09-08 DIAGNOSIS — C7A098 Malignant carcinoid tumors of other sites: Secondary | ICD-10-CM

## 2016-09-08 DIAGNOSIS — I340 Nonrheumatic mitral (valve) insufficiency: Secondary | ICD-10-CM

## 2016-09-08 DIAGNOSIS — M7989 Other specified soft tissue disorders: Secondary | ICD-10-CM

## 2016-09-08 DIAGNOSIS — C7A1 Malignant poorly differentiated neuroendocrine tumors: Secondary | ICD-10-CM

## 2016-09-08 DIAGNOSIS — F329 Major depressive disorder, single episode, unspecified: Secondary | ICD-10-CM | POA: Insufficient documentation

## 2016-09-08 DIAGNOSIS — Z992 Dependence on renal dialysis: Secondary | ICD-10-CM | POA: Diagnosis not present

## 2016-09-08 DIAGNOSIS — S0990XA Unspecified injury of head, initial encounter: Secondary | ICD-10-CM | POA: Diagnosis present

## 2016-09-08 DIAGNOSIS — F0390 Unspecified dementia without behavioral disturbance: Secondary | ICD-10-CM | POA: Insufficient documentation

## 2016-09-08 DIAGNOSIS — E039 Hypothyroidism, unspecified: Secondary | ICD-10-CM | POA: Insufficient documentation

## 2016-09-08 DIAGNOSIS — I509 Heart failure, unspecified: Secondary | ICD-10-CM

## 2016-09-08 DIAGNOSIS — N189 Chronic kidney disease, unspecified: Secondary | ICD-10-CM | POA: Insufficient documentation

## 2016-09-08 DIAGNOSIS — Z8673 Personal history of transient ischemic attack (TIA), and cerebral infarction without residual deficits: Secondary | ICD-10-CM

## 2016-09-08 DIAGNOSIS — Y999 Unspecified external cause status: Secondary | ICD-10-CM | POA: Diagnosis not present

## 2016-09-08 DIAGNOSIS — Y9289 Other specified places as the place of occurrence of the external cause: Secondary | ICD-10-CM | POA: Insufficient documentation

## 2016-09-08 DIAGNOSIS — I4891 Unspecified atrial fibrillation: Secondary | ICD-10-CM | POA: Insufficient documentation

## 2016-09-08 DIAGNOSIS — Z85030 Personal history of malignant carcinoid tumor of large intestine: Secondary | ICD-10-CM | POA: Insufficient documentation

## 2016-09-08 DIAGNOSIS — W19XXXA Unspecified fall, initial encounter: Secondary | ICD-10-CM | POA: Insufficient documentation

## 2016-09-08 LAB — CBC WITH DIFFERENTIAL/PLATELET
BASOS PCT: 1 %
Basophils Absolute: 0 10*3/uL (ref 0–0.1)
EOS ABS: 0.2 10*3/uL (ref 0–0.7)
Eosinophils Relative: 4 %
HCT: 34.1 % — ABNORMAL LOW (ref 35.0–47.0)
HEMOGLOBIN: 11.6 g/dL — AB (ref 12.0–16.0)
Lymphocytes Relative: 21 %
Lymphs Abs: 1 10*3/uL (ref 1.0–3.6)
MCH: 32.1 pg (ref 26.0–34.0)
MCHC: 34 g/dL (ref 32.0–36.0)
MCV: 94.2 fL (ref 80.0–100.0)
Monocytes Absolute: 0.4 10*3/uL (ref 0.2–0.9)
Monocytes Relative: 8 %
NEUTROS PCT: 66 %
Neutro Abs: 3.1 10*3/uL (ref 1.4–6.5)
Platelets: 139 10*3/uL — ABNORMAL LOW (ref 150–440)
RBC: 3.62 MIL/uL — AB (ref 3.80–5.20)
RDW: 16.6 % — ABNORMAL HIGH (ref 11.5–14.5)
WBC: 4.6 10*3/uL (ref 3.6–11.0)

## 2016-09-08 LAB — COMPREHENSIVE METABOLIC PANEL
ALK PHOS: 138 U/L — AB (ref 38–126)
ALT: 91 U/L — AB (ref 14–54)
ANION GAP: 8 (ref 5–15)
AST: 66 U/L — ABNORMAL HIGH (ref 15–41)
Albumin: 3.8 g/dL (ref 3.5–5.0)
BUN: 29 mg/dL — ABNORMAL HIGH (ref 6–20)
CALCIUM: 9.6 mg/dL (ref 8.9–10.3)
CO2: 28 mmol/L (ref 22–32)
Chloride: 100 mmol/L — ABNORMAL LOW (ref 101–111)
Creatinine, Ser: 3 mg/dL — ABNORMAL HIGH (ref 0.44–1.00)
GFR calc Af Amer: 15 mL/min — ABNORMAL LOW (ref >60)
GFR calc non Af Amer: 13 mL/min — ABNORMAL LOW (ref >60)
GLUCOSE: 104 mg/dL — AB (ref 65–99)
Potassium: 4.2 mmol/L (ref 3.5–5.1)
SODIUM: 136 mmol/L (ref 135–145)
Total Bilirubin: 1.1 mg/dL (ref 0.3–1.2)
Total Protein: 6.4 g/dL — ABNORMAL LOW (ref 6.5–8.1)

## 2016-09-08 MED ORDER — OCTREOTIDE ACETATE 30 MG IM KIT
30.0000 mg | PACK | Freq: Once | INTRAMUSCULAR | Status: AC
  Administered 2016-09-08: 30 mg via INTRAMUSCULAR
  Filled 2016-09-08: qty 1

## 2016-09-08 NOTE — ED Provider Notes (Signed)
[facility] Emergency Department Provider Note       Time seen: ----------------------------------------- 12:33 PM on 09/08/2016 -----------------------------------------     I have reviewed the triage vital signs and the nursing notes.   HISTORY   Chief Complaint Fall and Head Injury    HPI Kerry Walsh is a 83 y.o. female who presents to the ED for an unwitnessed fall that occurred at dialysis. There is an unclear etiology as to why she fell. She did hit her head but she did not lose consciousness. She has no other injuries or complaints. She denies any recent illness. She was due for dialysis this morning.   Past Medical History:  Diagnosis Date  . Anemia   . Anxiety   . Arthritis   . Atrial fibrillation ([place])   . Cancer [facility])    Carcinoid Tumor  . CHF (congestive heart failure) ([place])   . Chronic kidney disease    CKD with last known creatinine fo 2.4 in Dec 2015  . Coronary artery disease   . Dementia   . Depression   . Dialysis patient [facility])    Mon. Wed. Fri.  . Dysrhythmia    Atrial Fibrillation  . Hypertension   . Hypothyroidism   . Malignant poorly differentiated neuroendocrine carcinoma ([place]) 08/12/2014  . Mitral valve regurgitation   . Osteoporosis   . Stroke [facility])    No residual neurological deficits    Patient Active Problem List   Diagnosis Date Noted  . Hyperlipidemia 02/29/2016  . ESRD on dialysis ([place]) 02/29/2016  . 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 MI (mitral incompetence) 12/25/2014  . Chronic systolic heart failure ([place]) 10/01/2014  . Elevated troponin 09/25/2014  . HTN (hypertension) 09/25/2014  . Acute CHF ([place]) 09/25/2014  . Essential (primary) hypertension 09/25/2014  . 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 Dizziness 06/28/2014  . A-fib ([place]) 06/28/2014  . Dizziness and giddiness 06/28/2014  . Osteoporosis, post-menopausal 03/09/2014  .  ADH disorder ([place]) 01/23/2014  . Disorder of posterior pituitary ([place]) 01/23/2014  . Syndrome of inappropriate secretion of antidiuretic hormone ([place]) 01/23/2014  . Infected prosthetic knee joint ([place]) 01/12/2014  . Infection and inflammatory reaction due to internal prosthetic device, implant, and graft 01/12/2014  . Arteriosclerosis of coronary artery 08/23/2013  . Hypothyroid 08/23/2013    Past Surgical History:  Procedure Laterality Date  . ABDOMINAL HYSTERECTOMY    . AV FISTULA PLACEMENT Left 01/19/2016   Procedure: INSERTION OF ARTERIOVENOUS (AV) GORE-TEX GRAFT ARM ( BRACH / AXILLARY );  Surgeon: Katha Cabal, MD;  Location: [facility] ORS;  Service: Vascular;  Laterality: Left;  . BACK SURGERY    . CHOLECYSTECTOMY    . EYE SURGERY Bilateral    Cataract Extraction with IOL  . JOINT REPLACEMENT Bilateral    Knee replacement surgery- bilateral  . PERIPHERAL VASCULAR CATHETERIZATION N/A 07/27/2015   Procedure: Dialysis/Perma Catheter Insertion;  Surgeon: Katha Cabal, MD;  Location: [place] CV LAB;  Service: Cardiovascular;  Laterality: N/A;  . PERIPHERAL VASCULAR CATHETERIZATION N/A 03/21/2016   Procedure: Dialysis/Perma Catheter Removal;  Surgeon: Katha Cabal, MD;  Location: [place] CV LAB;  Service: Cardiovascular;  Laterality: N/A;  . SHOULDER SURGERY Bilateral     Allergies Penicillins  Social History Social History  Substance Use Topics  . Smoking status: Never Smoker  . Smokeless tobacco: Never Used  . Alcohol use No    Review of Systems Constitutional: Negative for fever.  Eyes: Negative for vision changes ENT:  Negative for congestion, sore throat Cardiovascular: Negative for chest pain. Respiratory: Negative for shortness of breath. Gastrointestinal: Negative for abdominal pain, vomiting and diarrhea. Genitourinary: Negative for dysuria. Musculoskeletal: Negative for back pain. Skin: Negative for rash. Neurological: Positive for mild  headache  All systems negative/normal/unremarkable except as stated in the HPI  ____________________________________________   PHYSICAL EXAM:  VITAL SIGNS: ED Triage Vitals  Enc Vitals Group     BP 09/08/16 1226 (!) 148/62     Pulse Rate 09/08/16 1223 88     Resp 09/08/16 1223 17     Temp 09/08/16 1223 97.6 F (36.4 C)     Temp Source 09/08/16 1223 Oral     SpO2 09/08/16 1223 98 %     Weight 09/08/16 1224 114 lb (51.7 kg)     Height 09/08/16 1224 5\' 3"  (1.6 m)     Head Circumference --      Peak Flow --      Pain Score 09/08/16 1223 0     Pain Loc --      Pain Edu? --      Excl. in [place]? --     Constitutional: Alert and oriented. Well appearing and in no distress. Eyes: Small bilateral subconjunctival hemorrhages noted ENT   Head: Normocephalic, No hematoma is noted   Nose: No congestion/rhinnorhea.   Mouth/Throat: Mucous membranes are moist.   Neck: No stridor. Cardiovascular: Normal rate, regular rhythm. No murmurs, rubs, or gallops. Respiratory: Normal respiratory effort without tachypnea nor retractions. Breath sounds are clear and equal bilaterally. No wheezes/rales/rhonchi. Gastrointestinal: Soft and nontender. Normal bowel sounds Musculoskeletal: Nontender with normal range of motion in extremities. No lower extremity tenderness nor edema. Neurologic:  Normal speech and language. No gross focal neurologic deficits are appreciated.  Skin:  Skin is warm, dry and intact. No rash noted. Psychiatric: Mood and affect are normal. Speech and behavior are normal.  ____________________________________________  ED COURSE:  Pertinent labs & imaging results that were available during my care of the patient were reviewed by me and considered in my medical decision making (see chart for details). Patient presents for fall with head injury, we will assess with labs and imaging as indicated.   Procedures ____________________________________________    RADIOLOGY Images were viewed by me  CT head Is unremarkable ____________________________________________  FINAL ASSESSMENT AND PLAN  Fall, head injury  Plan: Patient's labs were dictated above. Patient had presented for a fall that occurred while at dialysis. She is neurologically intact at this time with a normal CT and no further complaints. She stable for outpatient follow-up.   Earleen Newport, MD   Note: This note was generated in part or whole with voice recognition software. Voice recognition is usually quite accurate but there are transcription errors that can and very often do occur. I apologize for any typographical errors that were not detected and corrected.     Earleen Newport, MD 09/08/16 [id number]

## 2016-09-08 NOTE — Assessment & Plan Note (Deleted)
#   METASTATIC CARCINOID of ovary; on sandostatin q monthly. Clinically asymptomatic from carcinoid syndrome. No evidence of any progression clinically. Patient has not had imaging in the last many years. Discussed regarding imaging with the patient and daughter- decline. I think it's reasonable given her age/comorbidities to watch her clinically at this time. If any concerns for progression tend recommend a gallium PET scan.  # ANEMIA- /CKD/ on HD- hb 11.8/improved. On PO iron.   # slightly elevated LFTs- ? Sandosatin;   # dementia- mild- stable.   # Afib  On coumadin- stable. No falls.   # follow up in 3 months/ labs.chromgranin A continue Sandostatin monthly.

## 2016-09-08 NOTE — Progress Notes (Signed)
Dr. Rogue Bussing approves giving Sandostatin 1 week early.

## 2016-09-08 NOTE — Assessment & Plan Note (Addendum)
#   METASTATIC CARCINOID of ovary; on sandostatin q monthly. Clinically asymptomatic from carcinoid syndrome. No evidence of any progression clinically.   #Tolerating Sandostatin without any major side effects. Imaging discussed - plan to hold off given her age and multiple comorbidities.   # ANEMIA- /CKD/ on HD- hb 11.8/improved. On PO iron.   # dementia- mild- stable.   # Afib  On coumadin- stable. No falls.   # follow up in 3 months/ labs.chromgranin A continue Sandostatin monthly.  

## 2016-09-08 NOTE — ED Triage Notes (Signed)
Per pt daughter, pt had an unwitnessed fall at dialysis and hit the back of her head. Denies LOC, just tender at the site with palpation. Pt has a hx of dementia and is at baseline at present.

## 2016-09-08 NOTE — Progress Notes (Signed)
[place] OFFICE PROGRESS NOTE  Patient Care Team: Kirk Ruths, MD as PCP - General (Internal Medicine)  Cancer Staging No matching staging information was found for the patient.   Oncology History   1. Ovarian cystic mass underwent robotic bilateral salpingo-oophorectomy in April of 2011 [in ? [place]] Neuroendocrine tumor, poorly differentiated carcinoid 2. Octreotide scan revealed mets to retro peritoneal lymph node. Patient underwent lymph node dissection in July of 2011. 3. Patient has been started on Sandostatin LAR from September of 2012 because of symptoms of carcinoid syndrome.  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  Kerry Walsh 83 y.o.  female pleasant patient above history of Carcinoid of the ovary is here for follow-up; patient is currently on Sandostatin.   Patient continues on dialysis without any major problems. She has mild swelling in the legs. This is not getting any worse. Patient denies diarrhea. Denies any wheezing or shortness of breath or cough. Appetite is good. No weight loss. No falls. Patient continues on Coumadin. No bleeding.  REVIEW OF SYSTEMS:  A complete 10 point review of system is done which is negative except mentioned above/history of present illness.   PAST MEDICAL HISTORY :  Past Medical History:  Diagnosis Date  . Anemia   . Anxiety   . Arthritis   . Atrial fibrillation ([place])   . Cancer [facility])    Carcinoid Tumor  . CHF (congestive heart failure) ([place])   . Chronic kidney disease    CKD with last known creatinine fo 2.4 in Dec 2015  . Coronary artery disease   . Dementia   . Depression   . Dialysis patient [facility])    Mon. Wed. Fri.  . Dysrhythmia    Atrial Fibrillation  . Hypertension   . Hypothyroidism   . Malignant poorly  differentiated neuroendocrine carcinoma ([place]) 08/12/2014  . Mitral valve regurgitation   . Osteoporosis   . Stroke ([place])    No residual neurological deficits    PAST SURGICAL HISTORY :   Past Surgical History:  Procedure Laterality Date  . ABDOMINAL HYSTERECTOMY    . AV FISTULA PLACEMENT Left 01/19/2016   Procedure: INSERTION OF ARTERIOVENOUS (AV) GORE-TEX GRAFT ARM ( BRACH / AXILLARY );  Surgeon: Katha Cabal, MD;  Location: [facility] ORS;  Service: Vascular;  Laterality: Left;  . BACK SURGERY    . CHOLECYSTECTOMY    . EYE SURGERY Bilateral    Cataract Extraction with IOL  . JOINT REPLACEMENT Bilateral    Knee replacement surgery- bilateral  . PERIPHERAL VASCULAR CATHETERIZATION N/A 07/27/2015   Procedure: Dialysis/Perma Catheter Insertion;  Surgeon: Katha Cabal, MD;  Location: [place] CV LAB;  Service: Cardiovascular;  Laterality: N/A;  . PERIPHERAL VASCULAR CATHETERIZATION N/A 03/21/2016   Procedure: Dialysis/Perma Catheter Removal;  Surgeon: Katha Cabal, MD;  Location: [place] CV LAB;  Service: Cardiovascular;  Laterality: N/A;  . SHOULDER SURGERY Bilateral     FAMILY HISTORY :   Family History  Problem Relation Age of Onset  . CAD Father   . Stroke Father   . Kidney failure Mother     SOCIAL HISTORY:   Social History  Substance Use Topics  . Smoking status: Never Smoker  . Smokeless tobacco: Never Used  . Alcohol use No    ALLERGIES:  is allergic to penicillins.  MEDICATIONS:  Current Outpatient Prescriptions  Medication Sig Dispense Refill  . acetaminophen (TYLENOL) 500 MG tablet Take 500-1,000 mg by mouth every 6 (six) hours as needed (for pain).    Marland Kitchen amLODipine (NORVASC) 5 MG tablet Take 5 mg by mouth daily.     Marland Kitchen levothyroxine (SYNTHROID, LEVOTHROID) 88 MCG tablet Take 88 mcg by mouth daily before breakfast.     . metoprolol (LOPRESSOR) 50 MG tablet Take 1 tablet (50 mg total) by mouth 2 (two) times daily. (Patient taking differently:  Take 50 mg by mouth daily. ) 60 tablet 0  . multivitamin (RENA-VIT) TABS tablet Take 1 tablet by mouth daily.    Marland Kitchen torsemide (DEMADEX) 10 MG tablet Take 10 mg by mouth daily. Only on Tuesday, Thursday, Saturdays and Sundays (when pt is not on dialysis)    . warfarin (COUMADIN) 2.5 MG tablet Take 2.5 mg by mouth every other day.     . warfarin (COUMADIN) 5 MG tablet Take 5 mg by mouth every other day.      No current facility-administered medications for this visit.     PHYSICAL EXAMINATION: ECOG PERFORMANCE STATUS: 0 - Asymptomatic  BP (!) 150/76 (BP Location: Right Arm, Patient Position: Sitting)   Pulse 90   Temp 97.8 F (36.6 C) (Tympanic)   Resp 20   Ht 5\' 3"  (1.6 m)   Wt 114 lb 12.8 oz (52.1 kg)   BMI 20.34 kg/m   Filed Weights   09/08/16 1023  Weight: 114 lb 12.8 oz (52.1 kg)    GENERAL: Frail-appearing thin built Caucasian female patient is walking herself; Alert, no distress and comfortable. Accompanied by daughter/Husband. EYES: no pallor or icterus; positive for conjunctival injection bilaterally. OROPHARYNX: no thrush or ulceration; good dentition  NECK: supple, no masses felt LYMPH:  no palpable lymphadenopathy in the cervical, axillary or inguinal regions LUNGS: clear to auscultation and  No wheeze or crackles HEART/CVS: regular rate & rhythm and no murmurs; No lower extremity edema ABDOMEN:abdomen soft, non-tender and normal bowel sounds Musculoskeletal:no cyanosis of digits and no clubbing  PSYCH: alert & oriented x 3 with fluent speech NEURO: no focal motor/sensory deficits SKIN:  no rashes or significant lesions  LABORATORY DATA:  I have reviewed the data as listed    Component Value Date/Time   NA 136 09/08/2016 1000   NA 135 05/20/2014 1402   K 4.2 09/08/2016 1000   K 4.5 05/20/2014 1402   CL 100 (L) 09/08/2016 1000   CL 106 05/20/2014 1402   CO2 28 09/08/2016 1000   CO2 22 05/20/2014 1402   GLUCOSE 104 (H) 09/08/2016 1000   GLUCOSE 91  05/20/2014 1402   BUN 29 (H) 09/08/2016 1000   BUN 61 (H) 05/20/2014 1402   CREATININE 3.00 (H) 09/08/2016 1000   CREATININE 2.72 (H) 05/20/2014 1402   CALCIUM 9.6 09/08/2016 1000   CALCIUM 8.7 (L) 05/20/2014 1402   PROT 6.4 (L) 09/08/2016 1000   PROT 7.0 05/20/2014 1402   ALBUMIN 3.8 09/08/2016 1000   ALBUMIN 4.1 05/20/2014 1402   AST 66 (H) 09/08/2016 1000   AST 23 05/20/2014 1402   ALT 91 (H) 09/08/2016 1000   ALT 12 (L) 05/20/2014 1402   ALKPHOS 138 (H) 09/08/2016 1000   ALKPHOS 90 05/20/2014 1402   BILITOT 1.1 09/08/2016 1000   BILITOT 0.5 05/20/2014 1402   GFRNONAA 13 (L) 09/08/2016 1000   GFRNONAA 15 (L) 05/20/2014 1402   GFRAA 15 (L) 09/08/2016 1000   GFRAA 18 (  L) 05/20/2014 1402    No results found for: SPEP, UPEP  Lab Results  Component Value Date   WBC 4.6 09/08/2016   NEUTROABS 3.1 09/08/2016   HGB 11.6 (L) 09/08/2016   HCT 34.1 (L) 09/08/2016   MCV 94.2 09/08/2016   PLT 139 (L) 09/08/2016      Chemistry      Component Value Date/Time   NA 136 09/08/2016 1000   NA 135 05/20/2014 1402   K 4.2 09/08/2016 1000   K 4.5 05/20/2014 1402   CL 100 (L) 09/08/2016 1000   CL 106 05/20/2014 1402   CO2 28 09/08/2016 1000   CO2 22 05/20/2014 1402   BUN 29 (H) 09/08/2016 1000   BUN 61 (H) 05/20/2014 1402   CREATININE 3.00 (H) 09/08/2016 1000   CREATININE 2.72 (H) 05/20/2014 1402      Component Value Date/Time   CALCIUM 9.6 09/08/2016 1000   CALCIUM 8.7 (L) 05/20/2014 1402   ALKPHOS 138 (H) 09/08/2016 1000   ALKPHOS 90 05/20/2014 1402   AST 66 (H) 09/08/2016 1000   AST 23 05/20/2014 1402   ALT 91 (H) 09/08/2016 1000   ALT 12 (L) 05/20/2014 1402   BILITOT 1.1 09/08/2016 1000   BILITOT 0.5 05/20/2014 1402       RADIOGRAPHIC STUDIES: I have personally reviewed the radiological images as listed and agreed with the findings in the report. Ct Head Wo Contrast  Result Date: 09/08/2016 CLINICAL DATA:  Unwitnessed fall with trauma to the head. EXAM: CT HEAD  WITHOUT CONTRAST TECHNIQUE: Contiguous axial images were obtained from the base of the skull through the vertex without intravenous contrast. COMPARISON:  06/28/2014 FINDINGS: Brain: No evidence of acute infarction, hemorrhage, hydrocephalus, extra-axial collection or mass lesion/mass effect. Large area of encephalomalacia in the left frontal lobe is stable, consistent with prior ischemic infarction. Large left posterior fossa meningioma is stable. Vascular: Calcific atherosclerotic disease at the skullbase. Skull: Normal. Negative for fracture or focal lesion. Sinuses/Orbits: No acute finding. Other: None. IMPRESSION: No acute intracranial abnormality. Stable chronic left frontal encephalomalacia. Stable large left posterior fossa meningioma. Mild brain parenchymal atrophy and chronic microvascular disease. Electronically Signed   By: Fidela Salisbury M.D.   On: 09/08/2016 13:05     ASSESSMENT & PLAN:  Carcinoid tumor of ovary, malignant ([place]) # METASTATIC CARCINOID of ovary; on sandostatin q monthly. Clinically asymptomatic from carcinoid syndrome. No evidence of any progression clinically.   #Tolerating Sandostatin without any major side effects. Imaging discussed - plan to hold off given her age and multiple comorbidities.   # ANEMIA- /CKD/ on HD- hb 11.8/improved. On PO iron.   # dementia- mild- stable.   # Afib  On coumadin- stable. No falls.   # follow up in 3 months/ labs.chromgranin A continue Sandostatin monthly.    Orders Placed This Encounter  Procedures  . CBC with Differential/Platelet    Standing Status:   Future    Standing Expiration Date:   09/08/2017  . Comprehensive metabolic panel    Standing Status:   Future    Standing Expiration Date:   09/08/2017  . Chromogranin A    Standing Status:   Future    Standing Expiration Date:   09/08/2017   All questions were answered. The patient knows to call the clinic with any problems, questions or concerns.      Cammie Sickle, MD 09/08/2016 5:51 PM

## 2016-09-14 ENCOUNTER — Emergency Department
Admission: EM | Admit: 2016-09-14 | Discharge: 2016-09-14 | Disposition: A | Discharge status: Home / Self Care | Payer: Medicare Other | Attending: Emergency Medicine | Admitting: Emergency Medicine

## 2016-09-14 ENCOUNTER — Other Ambulatory Visit: Payer: Self-pay

## 2016-09-14 ENCOUNTER — Encounter: Payer: Self-pay | Admitting: Emergency Medicine

## 2016-09-14 ENCOUNTER — Emergency Department: Payer: Medicare Other

## 2016-09-14 ENCOUNTER — Other Ambulatory Visit (HOSPITAL_COMMUNITY): Payer: Self-pay

## 2016-09-14 DIAGNOSIS — E039 Hypothyroidism, unspecified: Secondary | ICD-10-CM | POA: Diagnosis not present

## 2016-09-14 DIAGNOSIS — I10 Essential (primary) hypertension: Secondary | ICD-10-CM | POA: Diagnosis not present

## 2016-09-14 DIAGNOSIS — I5022 Chronic systolic (congestive) heart failure: Secondary | ICD-10-CM | POA: Insufficient documentation

## 2016-09-14 DIAGNOSIS — Z96653 Presence of artificial knee joint, bilateral: Secondary | ICD-10-CM | POA: Diagnosis not present

## 2016-09-14 DIAGNOSIS — Z79899 Other long term (current) drug therapy: Secondary | ICD-10-CM | POA: Diagnosis not present

## 2016-09-14 DIAGNOSIS — I4891 Unspecified atrial fibrillation: Secondary | ICD-10-CM | POA: Diagnosis not present

## 2016-09-14 DIAGNOSIS — R319 Hematuria, unspecified: Secondary | ICD-10-CM | POA: Insufficient documentation

## 2016-09-14 DIAGNOSIS — Z7901 Long term (current) use of anticoagulants: Secondary | ICD-10-CM | POA: Insufficient documentation

## 2016-09-14 DIAGNOSIS — K625 Hemorrhage of anus and rectum: Secondary | ICD-10-CM | POA: Diagnosis present

## 2016-09-14 LAB — PROTIME-INR
INR: 1.8
PROTHROMBIN TIME: 21.1 s — AB (ref 11.4–15.2)

## 2016-09-14 LAB — CBC
HCT: 32.9 % — ABNORMAL LOW (ref 35.0–47.0)
Hemoglobin: 11.2 g/dL — ABNORMAL LOW (ref 12.0–16.0)
MCH: 32.5 pg (ref 26.0–34.0)
MCHC: 34 g/dL (ref 32.0–36.0)
MCV: 95.7 fL (ref 80.0–100.0)
PLATELETS: 136 10*3/uL — AB (ref 150–440)
RBC: 3.44 MIL/uL — AB (ref 3.80–5.20)
RDW: 16.3 % — AB (ref 11.5–14.5)
WBC: 4 10*3/uL (ref 3.6–11.0)

## 2016-09-14 LAB — COMPREHENSIVE METABOLIC PANEL
ALK PHOS: 105 U/L (ref 38–126)
ALT: 45 U/L (ref 14–54)
AST: 34 U/L (ref 15–41)
Albumin: 3.6 g/dL (ref 3.5–5.0)
Anion gap: 7 (ref 5–15)
BILIRUBIN TOTAL: 0.6 mg/dL (ref 0.3–1.2)
BUN: 27 mg/dL — ABNORMAL HIGH (ref 6–20)
CALCIUM: 8.9 mg/dL (ref 8.9–10.3)
CO2: 30 mmol/L (ref 22–32)
CREATININE: 2.77 mg/dL — AB (ref 0.44–1.00)
Chloride: 101 mmol/L (ref 101–111)
GFR, EST AFRICAN AMERICAN: 17 mL/min — AB (ref >60)
GFR, EST NON AFRICAN AMERICAN: 14 mL/min — AB (ref >60)
Glucose, Bld: 84 mg/dL (ref 65–99)
Potassium: 3.7 mmol/L (ref 3.5–5.1)
Sodium: 138 mmol/L (ref 135–145)
Total Protein: 5.9 g/dL — ABNORMAL LOW (ref 6.5–8.1)

## 2016-09-14 LAB — URINALYSIS, COMPLETE (UACMP) WITH MICROSCOPIC
BACTERIA UA: NONE SEEN
BILIRUBIN URINE: NEGATIVE
GLUCOSE, UA: NEGATIVE mg/dL
KETONES UR: NEGATIVE mg/dL
LEUKOCYTES UA: NEGATIVE
NITRITE: NEGATIVE
PH: 7 (ref 5.0–8.0)
PROTEIN: 100 mg/dL — AB
Specific Gravity, Urine: 1.016 (ref 1.005–1.030)

## 2016-09-14 NOTE — Discharge Instructions (Signed)
Please call the urologist. Dr. Cato Mulligan is on-call for Dr. Wonda Amis. They both work together. Please call them in the morning he should go to see fairly soon. Return here for pain fever vomiting feeling sicker or lightheaded and weaker. Continue with your dialysis and all your other usual activities.

## 2016-09-14 NOTE — ED Triage Notes (Signed)
Pt via pov from home with daughter and spouse. She fell last week and was seen as a precaution here in ED. Pt began to pass blood (presumably) from rectum today. Pt's daughter states it was enough bright red blood to fill her panties and discolor the toilet water. No clots noted. Pt is alert and oriented. NAD noted.

## 2016-09-14 NOTE — ED Provider Notes (Signed)
[facility] Emergency Department Provider Note   ____________________________________________   First MD Initiated Contact with Patient 09/14/16 2017     (approximate)  I have reviewed the triage vital signs and the nursing notes.   HISTORY  Chief Complaint Rectal Bleeding   HPI Kerry Walsh is a 83 y.o. female patient reports and daughter confirms the patient felt like she had to go to stool today set down on the toilet and had a lot of blood coming out. She is not lightheaded or weak. She has not had this happen to her before. Patient has no abdominal pain nausea vomiting diarrhea or other complaints. Patient does take Coumadin.   Past Medical History:  Diagnosis Date  . Anemia   . Anxiety   . Arthritis   . Atrial fibrillation ([place])   . Cancer [facility])    Carcinoid Tumor  . CHF (congestive heart failure) ([place])   . Chronic kidney disease    CKD with last known creatinine fo 2.4 in Dec 2015  . Coronary artery disease   . Dementia   . Depression   . Dialysis patient [facility])    Mon. Wed. Fri.  . Dysrhythmia    Atrial Fibrillation  . Hypertension   . Hypothyroidism   . Malignant poorly differentiated neuroendocrine carcinoma ([place]) 08/12/2014  . Mitral valve regurgitation   . Osteoporosis   . Stroke [facility])    No residual neurological deficits    Patient Active Problem List   Diagnosis Date Noted  . Hyperlipidemia 02/29/2016  . ESRD on dialysis ([place]) 02/29/2016  . Carcinoid tumor of ovary, malignant ([place]) 11/01/2015  . MI (mitral incompetence) 12/25/2014  . Chronic systolic heart failure ([place]) 10/01/2014  . Elevated troponin 09/25/2014  . HTN (hypertension) 09/25/2014  . Acute CHF ([place]) 09/25/2014  . Essential (primary) hypertension 09/25/2014  . Malignant poorly differentiated neuroendocrine carcinoma ([place]) 08/12/2014  . Dizziness 06/28/2014  . A-fib ([place]) 06/28/2014  . Dizziness and giddiness 06/28/2014  . Osteoporosis,  post-menopausal 03/09/2014  . ADH disorder ([place]) 01/23/2014  . Disorder of posterior pituitary ([place]) 01/23/2014  . Syndrome of inappropriate secretion of antidiuretic hormone ([place]) 01/23/2014  . Infected prosthetic knee joint ([place]) 01/12/2014  . Infection and inflammatory reaction due to internal prosthetic device, implant, and graft 01/12/2014  . Arteriosclerosis of coronary artery 08/23/2013  . Hypothyroid 08/23/2013    Past Surgical History:  Procedure Laterality Date  . ABDOMINAL HYSTERECTOMY    . AV FISTULA PLACEMENT Left 01/19/2016   Procedure: INSERTION OF ARTERIOVENOUS (AV) GORE-TEX GRAFT ARM ( BRACH / AXILLARY );  Surgeon: Katha Cabal, MD;  Location: [facility] ORS;  Service: Vascular;  Laterality: Left;  . BACK SURGERY    . CHOLECYSTECTOMY    . EYE SURGERY Bilateral    Cataract Extraction with IOL  . JOINT REPLACEMENT Bilateral    Knee replacement surgery- bilateral  . PERIPHERAL VASCULAR CATHETERIZATION N/A 07/27/2015   Procedure: Dialysis/Perma Catheter Insertion;  Surgeon: Katha Cabal, MD;  Location: [place] CV LAB;  Service: Cardiovascular;  Laterality: N/A;  . PERIPHERAL VASCULAR CATHETERIZATION N/A 03/21/2016   Procedure: Dialysis/Perma Catheter Removal;  Surgeon: Katha Cabal, MD;  Location: [place] CV LAB;  Service: Cardiovascular;  Laterality: N/A;  . SHOULDER SURGERY Bilateral     Prior to Admission medications   Medication Sig Start Date End Date Taking? Authorizing Provider  acetaminophen (TYLENOL) 500 MG tablet Take 500-1,000 mg by mouth every 6 (six) hours as needed (for pain).  [provider]  amLODipine (NORVASC) 5 MG tablet Take 5 mg by mouth daily.  03/15/15   [provider]  levothyroxine (SYNTHROID, LEVOTHROID) 88 MCG tablet Take 88 mcg by mouth daily before breakfast.  10/23/15   [provider]  metoprolol (LOPRESSOR) 50 MG tablet Take 1 tablet (50 mg total) by mouth 2 (two) times daily. Patient taking  differently: Take 50 mg by mouth daily.  09/28/14   Loletha Grayer, MD  multivitamin (RENA-VIT) TABS tablet Take 1 tablet by mouth daily.    [provider]  torsemide (DEMADEX) 10 MG tablet Take 10 mg by mouth daily. Only on Tuesday, Thursday, Saturdays and Sundays (when pt is not on dialysis) 05/05/15   [provider]  warfarin (COUMADIN) 2.5 MG tablet Take 2.5 mg by mouth every other day.     [provider]  warfarin (COUMADIN) 5 MG tablet Take 5 mg by mouth every other day.     [provider]    Allergies Penicillins  Family History  Problem Relation Age of Onset  . CAD Father   . Stroke Father   . Kidney failure Mother     Social History Social History  Substance Use Topics  . Smoking status: Never Smoker  . Smokeless tobacco: Never Used  . Alcohol use No    Review of Systems  Constitutional: No fever/chills Eyes: No visual changes. ENT: No sore throat. Cardiovascular: Denies chest pain. Respiratory: Denies shortness of breath. Gastrointestinal: No abdominal pain.  No nausea, no vomiting.  No diarrhea.  No constipation. Genitourinary: Negative for dysuria. Musculoskeletal: Negative for back pain. Skin: Negative for rash. Neurological: Negative for headaches, focal weakness or numbness.   ____________________________________________   PHYSICAL EXAM:  VITAL SIGNS: ED Triage Vitals  Enc Vitals Group     BP 09/14/16 1638 (!) 115/48     Pulse Rate 09/14/16 1638 77     Resp 09/14/16 1638 18     Temp 09/14/16 1638 97.7 F (36.5 C)     Temp Source 09/14/16 1638 Oral     SpO2 09/14/16 1638 94 %     Weight 09/14/16 1638 114 lb (51.7 kg)     Height 09/14/16 1638 5\' 3"  (1.6 m)     Head Circumference --      Peak Flow --      Pain Score 09/14/16 1637 0     Pain Loc --      Pain Edu? --      Excl. in [place]? --     Constitutional: Alert and oriented. Well appearing and in no acute distress. Eyes: Conjunctivae are normal. PERRL.  EOMI. Head: Atraumatic. Nose: No congestion/rhinnorhea. Mouth/Throat: Mucous membranes are moist.  Oropharynx non-erythematous. Neck: No stridor. Cardiovascular: Normal rate, regular rhythm. Grossly normal heart sounds.  Good peripheral circulation. Respiratory: Normal respiratory effort.  No retractions. Lungs CTAB. Gastrointestinal: Soft and nontender. No distention. No abdominal bruits. No CVA tenderness. Rectal: No obvious bleeding no obvious external hemorrhoids stool is Hemoccult negative no evidence of bleeding from the vagina. Musculoskeletal: No lower extremity tenderness nor edema.  No joint effusions. Neurologic:  Normal speech and language. No gross focal neurologic deficits are appreciated.  Skin:  Skin is warm, dry and intact. No rash noted. Psychiatric: Mood and affect are normal. Speech and behavior are normal.  ____________________________________________   LABS (all labs ordered are listed, but only abnormal results are displayed)  Labs Reviewed  COMPREHENSIVE METABOLIC PANEL - Abnormal; Notable for the following:  Result Value   BUN 27 (*)    Creatinine, Ser 2.77 (*)    Total Protein 5.9 (*)    GFR calc non Af Amer 14 (*)    GFR calc Af Amer 17 (*)    All other components within normal limits  CBC - Abnormal; Notable for the following:    RBC 3.44 (*)    Hemoglobin 11.2 (*)    HCT 32.9 (*)    RDW 16.3 (*)    Platelets 136 (*)    All other components within normal limits  PROTIME-INR - Abnormal; Notable for the following:    Prothrombin Time 21.1 (*)    All other components within normal limits  URINALYSIS, COMPLETE (UACMP) WITH MICROSCOPIC - Abnormal; Notable for the following:    Color, Urine AMBER (*)    APPearance HAZY (*)    Hgb urine dipstick LARGE (*)    Protein, ur 100 (*)    Squamous Epithelial / LPF 0-5 (*)    All other components within normal limits  POC OCCULT BLOOD, ED  TYPE AND SCREEN    ____________________________________________  EKG  EKG shows an irregularly irregular rhythm with left bundle branch block rate of 67 probably A. fib with left bundle branch. ____________________________________________  RADIOLOGY   ____________________________________________   PROCEDURES  Procedure(s) performed:  Procedures  Critical Care performed:  ____________________________________________   INITIAL IMPRESSION / ASSESSMENT AND PLAN / ED COURSE  Pertinent labs & imaging results that were available during my care of the patient were reviewed by me and considered in my medical decision making (see chart for details).        ____________________________________________   FINAL CLINICAL IMPRESSION(S) / ED DIAGNOSES  Final diagnoses:  Hematuria  Hematuria, unspecified type      NEW MEDICATIONS STARTED DURING THIS VISIT:  New Prescriptions   No medications on file     Note:  This document was prepared using Dragon voice recognition software and may include unintentional dictation errors.    Nena Polio, MD 09/14/16 [phone]

## 2016-09-21 LAB — TYPE AND SCREEN
ABO/RH(D): O POS
ANTIBODY SCREEN: POSITIVE
Unit division: 0
Unit division: 0

## 2016-09-21 LAB — BPAM RBC
Blood Product Expiration Date: 201808292359
Blood Product Expiration Date: 201808292359
ISSUE DATE / TIME: 201808011126
Unit Type and Rh: 5100
Unit Type and Rh: 5100

## 2016-10-04 NOTE — Progress Notes (Signed)
10/05/2016 12:13 PM   Kerry Walsh 10-04-1925 [id number]  Referring provider: Kirk Ruths, MD [facility] [place], [place] [address]  Chief Complaint  Patient presents with  . New Patient (Initial Visit)    Hematuria referred by ER    HPI: Patient is a 83 -year-old Caucasian female who presents today as a referral from [facility] ED for gross hematuria with her daughter, Kerry Walsh.   Patient was found to have gross hematuria in 08/2016.  Patient doesn't have a prior history of microscopic hematuria.    She does not have a prior history of recurrent urinary tract infections, nephrolithiasis, trauma to the genitourinary tract or malignancies of the genitourinary tract.   She does not have a family medical history of nephrolithiasis, malignancies of the genitourinary tract or hematuria.   Today, she is having symptoms of frequent urination, urgency, nocturia, incontinence and intermittency.  Her UA today was negative.   She is not experiencing any suprapubic pain, abdominal pain or flank pain.  She denies any recent fevers, chills, nausea or vomiting.   CT Renal stone study performed on 09/14/2016 noted subcutaneous edema. Bilateral pleural effusions. Findings suggest anasarca.  No peritoneal blood or free air. No evidence of significant intra- abdominal traumatic injury on unenhanced scanning.  Aortic atherosclerosis.  Colonic diverticulosis.  She is not a smoker.  She is not exposed to secondhand smoke.  She has not worked with Sports administrator, trichloroethylene, etc.   She is a dialysis patient.    PMH: Past Medical History:  Diagnosis Date  . Anemia   . Anxiety   . Arthritis   . Atrial fibrillation ([place])   . Cancer [facility])    Carcinoid Tumor  . CHF (congestive heart failure) ([place])   . Chronic kidney disease    CKD with last known creatinine fo 2.4 in Dec 2015  . Coronary artery disease   . Dementia   . Depression   . Dialysis  patient [facility])    Mon. Wed. Fri.  . Dysrhythmia    Atrial Fibrillation  . Hypertension   . Hypothyroidism   . Malignant poorly differentiated neuroendocrine carcinoma ([place]) 08/12/2014  . Mitral valve regurgitation   . Osteoporosis   . Stroke [facility])    No residual neurological deficits    Surgical History: Past Surgical History:  Procedure Laterality Date  . ABDOMINAL HYSTERECTOMY    . AV FISTULA PLACEMENT Left 01/19/2016   Procedure: INSERTION OF ARTERIOVENOUS (AV) GORE-TEX GRAFT ARM ( BRACH / AXILLARY );  Surgeon: Katha Cabal, MD;  Location: [facility] ORS;  Service: Vascular;  Laterality: Left;  . BACK SURGERY    . CHOLECYSTECTOMY    . EYE SURGERY Bilateral    Cataract Extraction with IOL  . JOINT REPLACEMENT Bilateral    Knee replacement surgery- bilateral  . PERIPHERAL VASCULAR CATHETERIZATION N/A 07/27/2015   Procedure: Dialysis/Perma Catheter Insertion;  Surgeon: Katha Cabal, MD;  Location: [place] CV LAB;  Service: Cardiovascular;  Laterality: N/A;  . PERIPHERAL VASCULAR CATHETERIZATION N/A 03/21/2016   Procedure: Dialysis/Perma Catheter Removal;  Surgeon: Katha Cabal, MD;  Location: [place] CV LAB;  Service: Cardiovascular;  Laterality: N/A;  . SHOULDER SURGERY Bilateral     Home Medications:  Allergies as of 10/05/2016      Reactions   Penicillins Rash   Has patient had a PCN reaction causing immediate rash, facial/tongue/throat swelling, SOB or lightheadedness with hypotension: Yes Has patient had a  PCN reaction causing severe rash involving mucus membranes or skin necrosis: No Has patient had a PCN reaction that required hospitalization No Has patient had a PCN reaction occurring within the last 10 years: No If all of the above answers are "NO", then may proceed with Cephalosporin use.      Medication List       Accurate as of 10/05/16 12:13 PM. Always use your most recent med list.          acetaminophen 500 MG tablet Commonly known as:   TYLENOL Take 500-1,000 mg by mouth every 6 (six) hours as needed (for pain).   amLODipine 5 MG tablet Commonly known as:  NORVASC Take 5 mg by mouth daily.   levothyroxine 88 MCG tablet Commonly known as:  SYNTHROID, LEVOTHROID Take 88 mcg by mouth daily before breakfast.   metoprolol tartrate 50 MG tablet Commonly known as:  LOPRESSOR Take 1 tablet (50 mg total) by mouth 2 (two) times daily.   multivitamin Tabs tablet Take 1 tablet by mouth daily.   torsemide 10 MG tablet Commonly known as:  DEMADEX Take 10 mg by mouth daily. Only on Tuesday, Thursday, Saturdays and Sundays (when pt is not on dialysis)   warfarin 2.5 MG tablet Commonly known as:  COUMADIN Take 2.5 mg by mouth every other day.   warfarin 5 MG tablet Commonly known as:  COUMADIN Take 5 mg by mouth every other day.       Allergies:  Allergies  Allergen Reactions  . Penicillins Rash    Has patient had a PCN reaction causing immediate rash, facial/tongue/throat swelling, SOB or lightheadedness with hypotension: Yes Has patient had a PCN reaction causing severe rash involving mucus membranes or skin necrosis: No Has patient had a PCN reaction that required hospitalization No Has patient had a PCN reaction occurring within the last 10 years: No If all of the above answers are "NO", then may proceed with Cephalosporin use.     Family History: Family History  Problem Relation Age of Onset  . CAD Father   . Stroke Father   . Bladder Cancer Father   . Kidney failure Mother   . Kidney cancer Neg Hx     Social History:  reports that she has never smoked. She has never used smokeless tobacco. She reports that she does not drink alcohol or use drugs.  ROS: UROLOGY Frequent Urination?: Yes Hard to postpone urination?: Yes Burning/pain with urination?: No Get up at night to urinate?: Yes Leakage of urine?: Yes Urine stream starts and stops?: Yes Trouble starting stream?: No Do you have to strain to  urinate?: No Blood in urine?: Yes Urinary tract infection?: No Sexually transmitted disease?: No Injury to kidneys or bladder?: No Painful intercourse?: No Weak stream?: No Currently pregnant?: No Vaginal bleeding?: No Last menstrual period?: n  Gastrointestinal Nausea?: No Vomiting?: No Indigestion/heartburn?: No Diarrhea?: No Constipation?: No  Constitutional Fever: No Night sweats?: No Weight loss?: Yes Fatigue?: Yes  Skin Skin rash/lesions?: No Itching?: No  Eyes Blurred vision?: No Double vision?: No  Ears/Nose/Throat Sore throat?: No Sinus problems?: No  Hematologic/Lymphatic Swollen glands?: No Easy bruising?: Yes  Cardiovascular Leg swelling?: No Chest pain?: No  Respiratory Cough?: No Shortness of breath?: No  Endocrine Excessive thirst?: No  Musculoskeletal Back pain?: Yes Joint pain?: No  Neurological Headaches?: No Dizziness?: No  Psychologic Depression?: No Anxiety?: Yes  Physical Exam: BP 133/73   Pulse 65   Ht 5\' 3"  (1.6 m)  Wt 116 lb 9.6 oz (52.9 kg)   BMI 20.65 kg/m   Constitutional: Well nourished. Alert and oriented, No acute distress. HEENT: [place] AT, moist mucus membranes. Trachea midline, no masses. Cardiovascular: No clubbing, cyanosis, or edema. Respiratory: Normal respiratory effort, no increased work of breathing. GI: Abdomen is soft, non tender, non distended, no abdominal masses. Liver and spleen not palpable.  No hernias appreciated.  Stool sample for occult testing is not indicated.   GU: No CVA tenderness.  No bladder fullness or masses.   Skin: No rashes, bruises or suspicious lesions. Lymph: No cervical or inguinal adenopathy. Neurologic: Grossly intact, no focal deficits, moving all 4 extremities. Psychiatric: Normal mood and affect.  Laboratory Data: Lab Results  Component Value Date   WBC 4.0 09/14/2016   HGB 11.2 (L) 09/14/2016   HCT 32.9 (L) 09/14/2016   MCV 95.7 09/14/2016   PLT 136 (L)  09/14/2016    Lab Results  Component Value Date   CREATININE 2.77 (H) 09/14/2016     Lab Results  Component Value Date   AST 34 09/14/2016   Lab Results  Component Value Date   ALT 45 09/14/2016     Urinalysis Unremarkable.  See EPIC.    Pertinent Imaging: CLINICAL DATA:  [place] last week.  Passing blood per rectum today.  EXAM: CT ABDOMEN AND PELVIS WITHOUT CONTRAST  TECHNIQUE: Multidetector CT imaging of the abdomen and pelvis was performed following the standard protocol without IV contrast.  COMPARISON:  None.  FINDINGS: Lower chest: Small pleural effusions bilaterally. No airspace consolidation. No pneumothorax in the base.  Hepatobiliary: No focal liver abnormality is seen. Status post cholecystectomy. No biliary dilatation.  Pancreas: Unremarkable. No pancreatic ductal dilatation or surrounding inflammatory changes.  Spleen: Normal in size without focal abnormality.  Adrenals/Urinary Tract: Adrenals are normal. Left renal cysts measuring up to 5.6 cm. No hydronephrosis. No significant renal parenchymal lesions on unenhanced scanning. Ureters and urinary bladder are unremarkable.  Stomach/Bowel: Stomach and small bowel are unremarkable. Moderate colonic diverticulosis. No bowel obstruction or inflammation. No extraluminal gas.  Vascular/Lymphatic: The abdominal aorta is heavily calcified, normal in caliber.  Reproductive: Hysterectomy.  No adnexal abnormalities.  Other: No ascites.  There is extensive subcutaneous edema.  Musculoskeletal: No fracture is evident. No significant bone lesion. There is posterior decompression and instrumented fusion from L3 through S1.  IMPRESSION: 1. Subcutaneous edema. Bilateral pleural effusions. Findings suggest anasarca. 2. No peritoneal blood or free air. No evidence of significant intra- abdominal traumatic injury on unenhanced scanning. 3. Aortic atherosclerosis. 4. Colonic  diverticulosis.   Electronically Signed   By: Andreas Newport M.D.   On: 09/14/2016 22:40   Assessment & Plan:    1. Gross hematuria  - I explained to the patient that there are a number of causes that can be associated with blood in the urine, such as stones, UTI's, damage to the urinary tract and/or cancer - husband is a patient of ours and we are treating him for bladder cancer   - At this time, her daughter does not want to pursue a hematuria workup, she is aware of the risks but does not want to put her mother through more testing unless she should have another episode of gross hematuria  - I find this reasonable at her age and with her co-morbidities  - UA  - Urine culture  - BUN + creatinine    Return if symptoms worsen or fail to improve.  These notes generated with voice  recognition software. I apologize for typographical errors.  Zara Council, [facility] [address] [place], [place] [address] [phone]

## 2016-10-05 ENCOUNTER — Encounter: Payer: Self-pay | Admitting: Urology

## 2016-10-05 ENCOUNTER — Ambulatory Visit (INDEPENDENT_AMBULATORY_CARE_PROVIDER_SITE_OTHER): Payer: Medicare Other | Admitting: Urology

## 2016-10-05 VITALS — BP 133/73 | HR 65 | Ht 63.0 in | Wt 116.6 lb

## 2016-10-05 DIAGNOSIS — R310 Gross hematuria: Secondary | ICD-10-CM

## 2016-10-05 LAB — URINALYSIS, COMPLETE
BILIRUBIN UA: NEGATIVE
GLUCOSE, UA: NEGATIVE
Ketones, UA: NEGATIVE
LEUKOCYTES UA: NEGATIVE
Nitrite, UA: NEGATIVE
Specific Gravity, UA: 1.015 (ref 1.005–1.030)
UUROB: 0.2 mg/dL (ref 0.2–1.0)
pH, UA: 7.5 (ref 5.0–7.5)

## 2016-10-05 LAB — MICROSCOPIC EXAMINATION
Bacteria, UA: NONE SEEN
RBC, UA: NONE SEEN /hpf (ref 0–?)

## 2016-10-06 ENCOUNTER — Inpatient Hospital Stay: Payer: Medicare Other | Attending: Internal Medicine

## 2016-10-06 DIAGNOSIS — C7A098 Malignant carcinoid tumors of other sites: Secondary | ICD-10-CM | POA: Diagnosis not present

## 2016-10-06 DIAGNOSIS — Z79899 Other long term (current) drug therapy: Secondary | ICD-10-CM | POA: Insufficient documentation

## 2016-10-06 DIAGNOSIS — C7A1 Malignant poorly differentiated neuroendocrine tumors: Secondary | ICD-10-CM

## 2016-10-06 MED ORDER — OCTREOTIDE ACETATE 30 MG IM KIT
30.0000 mg | PACK | Freq: Once | INTRAMUSCULAR | Status: AC
  Administered 2016-10-06: 30 mg via INTRAMUSCULAR
  Filled 2016-10-06: qty 1

## 2016-10-08 LAB — CULTURE, URINE COMPREHENSIVE

## 2016-10-25 ENCOUNTER — Emergency Department
Admission: EM | Admit: 2016-10-25 | Discharge: 2016-10-25 | Disposition: A | Discharge status: Home / Self Care | Payer: Medicare Other | Attending: Emergency Medicine | Admitting: Emergency Medicine

## 2016-10-25 ENCOUNTER — Emergency Department: Payer: Medicare Other

## 2016-10-25 DIAGNOSIS — Z7901 Long term (current) use of anticoagulants: Secondary | ICD-10-CM | POA: Insufficient documentation

## 2016-10-25 DIAGNOSIS — I132 Hypertensive heart and chronic kidney disease with heart failure and with stage 5 chronic kidney disease, or end stage renal disease: Secondary | ICD-10-CM | POA: Diagnosis not present

## 2016-10-25 DIAGNOSIS — Z992 Dependence on renal dialysis: Secondary | ICD-10-CM | POA: Insufficient documentation

## 2016-10-25 DIAGNOSIS — R319 Hematuria, unspecified: Secondary | ICD-10-CM

## 2016-10-25 DIAGNOSIS — Z8589 Personal history of malignant neoplasm of other organs and systems: Secondary | ICD-10-CM | POA: Insufficient documentation

## 2016-10-25 DIAGNOSIS — I5022 Chronic systolic (congestive) heart failure: Secondary | ICD-10-CM | POA: Diagnosis not present

## 2016-10-25 DIAGNOSIS — N186 End stage renal disease: Secondary | ICD-10-CM | POA: Diagnosis not present

## 2016-10-25 DIAGNOSIS — N938 Other specified abnormal uterine and vaginal bleeding: Secondary | ICD-10-CM | POA: Diagnosis present

## 2016-10-25 DIAGNOSIS — Z8543 Personal history of malignant neoplasm of ovary: Secondary | ICD-10-CM | POA: Insufficient documentation

## 2016-10-25 DIAGNOSIS — N939 Abnormal uterine and vaginal bleeding, unspecified: Secondary | ICD-10-CM

## 2016-10-25 DIAGNOSIS — Z79899 Other long term (current) drug therapy: Secondary | ICD-10-CM | POA: Diagnosis not present

## 2016-10-25 LAB — BASIC METABOLIC PANEL
ANION GAP: 10 (ref 5–15)
BUN: 33 mg/dL — ABNORMAL HIGH (ref 6–20)
CALCIUM: 8.8 mg/dL — AB (ref 8.9–10.3)
CO2: 27 mmol/L (ref 22–32)
Chloride: 100 mmol/L — ABNORMAL LOW (ref 101–111)
Creatinine, Ser: 2.99 mg/dL — ABNORMAL HIGH (ref 0.44–1.00)
GFR, EST AFRICAN AMERICAN: 15 mL/min — AB (ref >60)
GFR, EST NON AFRICAN AMERICAN: 13 mL/min — AB (ref >60)
Glucose, Bld: 95 mg/dL (ref 65–99)
Potassium: 4 mmol/L (ref 3.5–5.1)
SODIUM: 137 mmol/L (ref 135–145)

## 2016-10-25 LAB — CBC WITH DIFFERENTIAL/PLATELET
BASOS ABS: 0.1 10*3/uL (ref 0–0.1)
BASOS PCT: 1 %
EOS ABS: 0.1 10*3/uL (ref 0–0.7)
Eosinophils Relative: 1 %
HCT: 35.1 % (ref 35.0–47.0)
HEMOGLOBIN: 11.8 g/dL — AB (ref 12.0–16.0)
Lymphocytes Relative: 18 %
Lymphs Abs: 1 10*3/uL (ref 1.0–3.6)
MCH: 32.4 pg (ref 26.0–34.0)
MCHC: 33.5 g/dL (ref 32.0–36.0)
MCV: 96.7 fL (ref 80.0–100.0)
Monocytes Absolute: 0.5 10*3/uL (ref 0.2–0.9)
Monocytes Relative: 10 %
NEUTROS ABS: 4 10*3/uL (ref 1.4–6.5)
NEUTROS PCT: 70 %
Platelets: 110 10*3/uL — ABNORMAL LOW (ref 150–440)
RBC: 3.63 MIL/uL — AB (ref 3.80–5.20)
RDW: 15.5 % — ABNORMAL HIGH (ref 11.5–14.5)
WBC: 5.7 10*3/uL (ref 3.6–11.0)

## 2016-10-25 LAB — URINALYSIS, COMPLETE (UACMP) WITH MICROSCOPIC
BILIRUBIN URINE: NEGATIVE
Bacteria, UA: NONE SEEN
GLUCOSE, UA: NEGATIVE mg/dL
Ketones, ur: NEGATIVE mg/dL
LEUKOCYTES UA: NEGATIVE
NITRITE: NEGATIVE
PH: 8 (ref 5.0–8.0)
Protein, ur: 100 mg/dL — AB
SPECIFIC GRAVITY, URINE: 1.01 (ref 1.005–1.030)

## 2016-10-25 LAB — PROTIME-INR
INR: 1.72
PROTHROMBIN TIME: 20 s — AB (ref 11.4–15.2)

## 2016-10-25 NOTE — ED Notes (Signed)
Pt o2 sat decreased to 86% with good wave form and pt states that she wears O2 at home when she needs it - placed on 2L via n/c - o2 increased to 94%

## 2016-10-25 NOTE — ED Notes (Signed)
Type and screen redrawn

## 2016-10-25 NOTE — ED Notes (Signed)
Per granddaughter pt has vaginal bleeding a few weeks ago and was seen in the ED with a urologist follow up - urology found nothing but mentioned doing a CT

## 2016-10-25 NOTE — ED Triage Notes (Signed)
Pt arrived via ems from [facility] for c/o vaginal bleeding that started yesterday - pt reports that it is bright red and moderate amount - pt denies any other symptoms and denies pain

## 2016-10-25 NOTE — ED Notes (Signed)
In and out cath performed with urine obtained - pt tolerated well - Caryl Pina RN present

## 2016-10-25 NOTE — Discharge Instructions (Signed)
Return to the emergency Department immediately for new, worsening, or persistent bleeding, pain, fever, weakness or lightheadedness, or any other new or worsening symptoms that concern you.  Follow up with the urologist within 1-2 weeks as discussed.

## 2016-10-25 NOTE — ED Notes (Signed)
Patient transported to Ultrasound 

## 2016-10-25 NOTE — ED Provider Notes (Signed)
[facility] Emergency Department Provider Note ____________________________________________   First MD Initiated Contact with Patient 10/25/16 1144     (approximate)  I have reviewed the triage vital signs and the nursing notes.   HISTORY  Chief Complaint Vaginal Bleeding    HPI Kerry Walsh is a 83 y.o. female who presents from assisted living facility with vaginal bleeding, acute onset yesterday, persistent course, moderate amount per patient, and not associated with pain, dizziness or any other acute symptoms. Patient was seen for similar symptom in the ED approx 6 weeks ago, followed up with urology.  No fever or urinary symptoms. No trauma.  Patient is on coumadin for afib.   Past Medical History:  Diagnosis Date  . Anemia   . Anxiety   . Arthritis   . Atrial fibrillation ([place])   . Cancer [facility])    Carcinoid Tumor  . CHF (congestive heart failure) ([place])   . Chronic kidney disease    CKD with last known creatinine fo 2.4 in Dec 2015  . Coronary artery disease   . Dementia   . Depression   . Dialysis patient [facility])    Mon. Wed. Fri.  . Dysrhythmia    Atrial Fibrillation  . Hypertension   . Hypothyroidism   . Malignant poorly differentiated neuroendocrine carcinoma ([place]) 08/12/2014  . Mitral valve regurgitation   . Osteoporosis   . Stroke [facility])    No residual neurological deficits    Patient Active Problem List   Diagnosis Date Noted  . Hyperlipidemia 02/29/2016  . ESRD on dialysis ([place]) 02/29/2016  . 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 MI (mitral incompetence) 12/25/2014  . Chronic systolic heart failure ([place]) 10/01/2014  . Elevated troponin 09/25/2014  . HTN (hypertension) 09/25/2014  . Acute CHF ([place]) 09/25/2014  . Essential (primary) hypertension 09/25/2014  . Malignant poorly differentiated neuroendocrine carcinoma ([place]) 08/12/2014  . Dizziness 06/28/2014  . A-fib ([place]) 06/28/2014  . Dizziness and  giddiness 06/28/2014  . Osteoporosis, post-menopausal 03/09/2014  . ADH disorder ([place]) 01/23/2014  . Disorder of posterior pituitary ([place]) 01/23/2014  . Syndrome of inappropriate secretion of antidiuretic hormone ([place]) 01/23/2014  . Infected prosthetic knee joint ([place]) 01/12/2014  . Infection and inflammatory reaction due to internal prosthetic device, implant, and graft 01/12/2014  . Arteriosclerosis of coronary artery 08/23/2013  . Hypothyroid 08/23/2013    Past Surgical History:  Procedure Laterality Date  . ABDOMINAL HYSTERECTOMY    . AV FISTULA PLACEMENT Left 01/19/2016   Procedure: INSERTION OF ARTERIOVENOUS (AV) GORE-TEX GRAFT ARM ( BRACH / AXILLARY );  Surgeon: Katha Cabal, MD;  Location: [facility] ORS;  Service: Vascular;  Laterality: Left;  . BACK SURGERY    . CHOLECYSTECTOMY    . EYE SURGERY Bilateral    Cataract Extraction with IOL  . JOINT REPLACEMENT Bilateral    Knee replacement surgery- bilateral  . PERIPHERAL VASCULAR CATHETERIZATION N/A 07/27/2015   Procedure: Dialysis/Perma Catheter Insertion;  Surgeon: Katha Cabal, MD;  Location: [place] CV LAB;  Service: Cardiovascular;  Laterality: N/A;  . PERIPHERAL VASCULAR CATHETERIZATION N/A 03/21/2016   Procedure: Dialysis/Perma Catheter Removal;  Surgeon: Katha Cabal, MD;  Location: [place] [facility] LAB;  Service: Cardiovascular;  Laterality: N/A;  . SHOULDER SURGERY Bilateral     Prior to Admission medications   Medication Sig Start Date End Date Taking? Authorizing Provider  acetaminophen (TYLENOL) 325 MG tablet Take 650 mg by mouth 4 (four) times daily as needed for  mild pain or moderate pain.   Yes [provider]  amLODipine (NORVASC) 5 MG tablet Take 5 mg by mouth every morning.    Yes [provider]  calcitRIOL (ROCALTROL) 0.25 MCG capsule Take 0.25 mcg by mouth daily.   Yes [provider]  doxycycline (VIBRAMYCIN) 100 MG capsule Take 100 mg by mouth every 12 (twelve)  hours. 10/19/16 10/29/16 Yes [provider]  levothyroxine (SYNTHROID, LEVOTHROID) 88 MCG tablet Take 88 mcg by mouth daily before breakfast.  10/23/15  Yes [provider]  metoprolol (LOPRESSOR) 50 MG tablet Take 1 tablet (50 mg total) by mouth 2 (two) times daily. Patient taking differently: Take 50 mg by mouth daily.  09/28/14  Yes Wieting, Richard, MD  torsemide (DEMADEX) 10 MG tablet Take 10 mg by mouth daily.    Yes [provider]  traMADol (ULTRAM) 50 MG tablet Take 50 mg by mouth every 4 (four) hours as needed for moderate pain.   Yes [provider]  warfarin (COUMADIN) 3 MG tablet Take 3 mg by mouth at bedtime.   Yes [provider]    Allergies Penicillins  Family History  Problem Relation Age of Onset  . CAD Father   . Stroke Father   . Bladder Cancer Father   . Kidney failure Mother   . Kidney cancer Neg Hx     Social History Social History  Substance Use Topics  . Smoking status: Never Smoker  . Smokeless tobacco: Never Used  . Alcohol use No    Review of Systems  Constitutional: No fever/chills Eyes: No visual changes. ENT: No bleeding gums Cardiovascular: Denies chest pain. Respiratory: Denies shortness of breath. Gastrointestinal: No abdominal pain.  Genitourinary: Negative for dysuria.   Musculoskeletal: Negative for back pain. Skin: Negative for rash. Neurological: Negative for headaches or lightheadedness.    ____________________________________________   PHYSICAL EXAM:  VITAL SIGNS: ED Triage Vitals  Enc Vitals Group     BP 10/25/16 1144 129/62     Pulse Rate 10/25/16 1144 60     Resp 10/25/16 1144 (!) 21     Temp 10/25/16 1144 98.2 F (36.8 C)     Temp Source 10/25/16 1144 Oral     SpO2 10/25/16 1144 94 %     Weight 10/25/16 1144 139 lb 8.8 oz (63.3 kg)     Height 10/25/16 1144 5\' 4"  (1.626 m)     Head Circumference --      Peak Flow --      Pain Score 10/25/16 1143 0     Pain Loc --       Pain Edu? --      Excl. in [place]? --     Constitutional: Alert and oriented. Well appearing and in no acute distress. Eyes: Conjunctivae are normal.  Head: Atraumatic. Nose: No congestion/rhinnorhea. Mouth/Throat: Mucous membranes are moist.   Neck: Normal range of motion.  Cardiovascular: Good peripheral circulation. Respiratory: Normal respiratory effort.   Gastrointestinal: Soft and nontender. No distention. No blood around the rectum or active rectal/anal bleeding Genitourinary: Small amount of dried blood on the external vaginal area, no active bleeding or any blood in the vaginal vault Musculoskeletal: No lower extremity edema.  Extremities warm and well perfused.  Neurologic:  Normal speech and language. No gross focal neurologic deficits are appreciated.  Skin:  Skin is warm and dry. No rash noted. Psychiatric: Mood and affect are normal. Speech and behavior are normal.  ____________________________________________   LABS (all labs  ordered are listed, but only abnormal results are displayed)  Labs Reviewed  BASIC METABOLIC PANEL - Abnormal; Notable for the following:       Result Value   Chloride 100 (*)    BUN 33 (*)    Creatinine, Ser 2.99 (*)    Calcium 8.8 (*)    GFR calc non Af Amer 13 (*)    GFR calc Af Amer 15 (*)    All other components within normal limits  CBC WITH DIFFERENTIAL/PLATELET - Abnormal; Notable for the following:    RBC 3.63 (*)    Hemoglobin 11.8 (*)    RDW 15.5 (*)    Platelets 110 (*)    All other components within normal limits  PROTIME-INR - Abnormal; Notable for the following:    Prothrombin Time 20.0 (*)    All other components within normal limits  URINALYSIS, COMPLETE (UACMP) WITH MICROSCOPIC - Abnormal; Notable for the following:    Color, Urine YELLOW (*)    APPearance CLEAR (*)    Hgb urine dipstick LARGE (*)    Protein, ur 100 (*)    Squamous Epithelial / LPF 0-5 (*)    All other components within normal limits  TYPE AND SCREEN    ____________________________________________  EKG   ____________________________________________  RADIOLOGY  Transabdominal pelvic ultrasound shows pelvic ascites, no bladder mass or other acute findings.  ____________________________________________   PROCEDURES  Procedure(s) performed: No    Critical Care performed: No ____________________________________________   INITIAL IMPRESSION / ASSESSMENT AND PLAN / ED COURSE  Pertinent labs & imaging results that were available during my care of the patient were reviewed by me and considered in my medical decision making (see chart for details).  28-year-old female presents with apparent vaginal bleeding, similar to an episode she had about 6 weeks ago that was actually diagnosed as hematuria. Per patient's granddaughter she followed up with urologist and had a UA done but no other testing, plan was to potentially do imaging if the symptom recurred. Patient is on Coumadin. On exam patient is comfortable appearing, vital signs are normal except for low O2 sat which is baseline for patient as she is on home O2 as needed, abdomen is soft and nontender and pelvic exam as described with no active bleeding. Presentation is most consistent with hematuria rather than vaginal bleeding although given no active bleeding right now I cannot be certain from patient's exam.  Ddx and cystitis, benign bleeding from bladder due to pt's anticoagulant use, vs possible urinary tract or gynecological malignancy.  Plan: Basic labs, coags and type and screen, UA, pelvic ultrasound and reassess. Clinical Course as of Oct 26 2102  Wed Oct 25, 2016  1327 Cr abnormal but is stable from pt's prior labs.   [SS]  1328 UA with blood but no WBCs or other signs of infection  [SS]    Clinical Course User Index [SS] Arta Silence, MD   ----------------------------------------- 2:36 PM on 10/25/2016 -----------------------------------------  On further  history patient has had a hysterectomy so we obtained transabdominal ultrasound of the bladder.  Ultrasound with nonspecific pelvic ascites but no other acute findings.  lab workup is unremarkable; patient's hemoglobin is stable, UA shows only blood but no evidence of infection and metabolic panel is consistent with patient's baseline.  Given no active bleeding, no abdominal tenderness, fever, or elevated white blood cell count and subacute nature of the symptoms, there is no indication for emergent CT or other imaging in the emergency department, and  patient is safe to go home.  I discussed patient's care with her and her granddaughter who is actively involved in her care. I recommended that they follow up with the urologist as already seen patient and they expressed understanding and agreement. Patient will likely need other imaging and potentially cystoscopy as an outpatient for further evaluation. Patient given thorough return precautions; instructed to return for any new or worsening bleeding, persistent bleeding, weakness, fever, pain or any other acute symptoms that concern her.   ____________________________________________   FINAL CLINICAL IMPRESSION(S) / ED DIAGNOSES  Final diagnoses:  Hematuria  Hematuria, unspecified type      NEW MEDICATIONS STARTED DURING THIS VISIT:  Discharge Medication List as of 10/25/2016  2:41 PM       Note:  This document was prepared using Dragon voice recognition software and may include unintentional dictation errors.    Arta Silence, MD 10/25/16 2105

## 2016-10-26 LAB — TYPE AND SCREEN
ABO/RH(D): O POS
Antibody Screen: NEGATIVE
Unit division: 0
Unit division: 0

## 2016-10-26 LAB — BPAM RBC
Blood Product Expiration Date: 201810032359
Blood Product Expiration Date: 201810042359
UNIT TYPE AND RH: 5100
Unit Type and Rh: 5100

## 2016-11-03 ENCOUNTER — Inpatient Hospital Stay: Payer: Medicare Other | Attending: Internal Medicine

## 2016-11-03 DIAGNOSIS — C7A1 Malignant poorly differentiated neuroendocrine tumors: Secondary | ICD-10-CM

## 2016-11-03 DIAGNOSIS — Z79899 Other long term (current) drug therapy: Secondary | ICD-10-CM | POA: Diagnosis not present

## 2016-11-03 DIAGNOSIS — C7A098 Malignant carcinoid tumors of other sites: Secondary | ICD-10-CM

## 2016-11-03 MED ORDER — OCTREOTIDE ACETATE 30 MG IM KIT
30.0000 mg | PACK | Freq: Once | INTRAMUSCULAR | Status: AC
  Administered 2016-11-03: 30 mg via INTRAMUSCULAR
  Filled 2016-11-03: qty 1

## 2016-11-30 ENCOUNTER — Telehealth: Payer: Self-pay | Admitting: Internal Medicine

## 2016-11-30 NOTE — Telephone Encounter (Signed)
Brooke, Let me know when the patient is available to come and we will try to accommodate pt.

## 2016-11-30 NOTE — Telephone Encounter (Signed)
Received a telephone message from Vivien Rossetti that pt appt needs to be r\s. Pt is scheduled for lab\MD\sandostatin. When would you like the pt to return for tx.

## 2016-12-01 ENCOUNTER — Inpatient Hospital Stay: Payer: Medicare Other | Admitting: Internal Medicine

## 2016-12-01 ENCOUNTER — Inpatient Hospital Stay: Payer: Medicare Other

## 2016-12-06 ENCOUNTER — Inpatient Hospital Stay: Payer: Medicare Other

## 2016-12-06 ENCOUNTER — Inpatient Hospital Stay (HOSPITAL_BASED_OUTPATIENT_CLINIC_OR_DEPARTMENT_OTHER): Payer: Medicare Other | Admitting: Internal Medicine

## 2016-12-06 ENCOUNTER — Other Ambulatory Visit: Payer: Self-pay

## 2016-12-06 ENCOUNTER — Inpatient Hospital Stay: Payer: Medicare Other | Attending: Internal Medicine

## 2016-12-06 VITALS — BP 147/65 | HR 71 | Temp 97.2°F | Resp 18 | Wt 151.4 lb

## 2016-12-06 DIAGNOSIS — D649 Anemia, unspecified: Secondary | ICD-10-CM | POA: Insufficient documentation

## 2016-12-06 DIAGNOSIS — Z7901 Long term (current) use of anticoagulants: Secondary | ICD-10-CM

## 2016-12-06 DIAGNOSIS — E039 Hypothyroidism, unspecified: Secondary | ICD-10-CM

## 2016-12-06 DIAGNOSIS — F0390 Unspecified dementia without behavioral disturbance: Secondary | ICD-10-CM | POA: Diagnosis not present

## 2016-12-06 DIAGNOSIS — C7A1 Malignant poorly differentiated neuroendocrine tumors: Secondary | ICD-10-CM | POA: Insufficient documentation

## 2016-12-06 DIAGNOSIS — Z992 Dependence on renal dialysis: Secondary | ICD-10-CM | POA: Insufficient documentation

## 2016-12-06 DIAGNOSIS — I509 Heart failure, unspecified: Secondary | ICD-10-CM

## 2016-12-06 DIAGNOSIS — Z90710 Acquired absence of both cervix and uterus: Secondary | ICD-10-CM

## 2016-12-06 DIAGNOSIS — Z8052 Family history of malignant neoplasm of bladder: Secondary | ICD-10-CM | POA: Insufficient documentation

## 2016-12-06 DIAGNOSIS — Z8673 Personal history of transient ischemic attack (TIA), and cerebral infarction without residual deficits: Secondary | ICD-10-CM | POA: Diagnosis not present

## 2016-12-06 DIAGNOSIS — I10 Essential (primary) hypertension: Secondary | ICD-10-CM | POA: Diagnosis not present

## 2016-12-06 DIAGNOSIS — I2510 Atherosclerotic heart disease of native coronary artery without angina pectoris: Secondary | ICD-10-CM | POA: Diagnosis not present

## 2016-12-06 DIAGNOSIS — N186 End stage renal disease: Secondary | ICD-10-CM | POA: Diagnosis not present

## 2016-12-06 DIAGNOSIS — F329 Major depressive disorder, single episode, unspecified: Secondary | ICD-10-CM | POA: Insufficient documentation

## 2016-12-06 DIAGNOSIS — Z90722 Acquired absence of ovaries, bilateral: Secondary | ICD-10-CM | POA: Insufficient documentation

## 2016-12-06 DIAGNOSIS — C7A098 Malignant carcinoid tumors of other sites: Secondary | ICD-10-CM

## 2016-12-06 DIAGNOSIS — I4891 Unspecified atrial fibrillation: Secondary | ICD-10-CM | POA: Diagnosis not present

## 2016-12-06 DIAGNOSIS — I129 Hypertensive chronic kidney disease with stage 1 through stage 4 chronic kidney disease, or unspecified chronic kidney disease: Secondary | ICD-10-CM

## 2016-12-06 DIAGNOSIS — F419 Anxiety disorder, unspecified: Secondary | ICD-10-CM | POA: Diagnosis not present

## 2016-12-06 DIAGNOSIS — Z79899 Other long term (current) drug therapy: Secondary | ICD-10-CM | POA: Diagnosis not present

## 2016-12-06 LAB — CBC WITH DIFFERENTIAL/PLATELET
Basophils Absolute: 0.1 10*3/uL (ref 0–0.1)
Basophils Relative: 1 %
EOS ABS: 0.1 10*3/uL (ref 0–0.7)
Eosinophils Relative: 1 %
HEMATOCRIT: 36.7 % (ref 35.0–47.0)
HEMOGLOBIN: 12.2 g/dL (ref 12.0–16.0)
LYMPHS ABS: 1.1 10*3/uL (ref 1.0–3.6)
Lymphocytes Relative: 21 %
MCH: 32.4 pg (ref 26.0–34.0)
MCHC: 33.2 g/dL (ref 32.0–36.0)
MCV: 97.6 fL (ref 80.0–100.0)
MONOS PCT: 9 %
Monocytes Absolute: 0.5 10*3/uL (ref 0.2–0.9)
NEUTROS ABS: 3.5 10*3/uL (ref 1.4–6.5)
NEUTROS PCT: 68 %
Platelets: 102 10*3/uL — ABNORMAL LOW (ref 150–440)
RBC: 3.76 MIL/uL — ABNORMAL LOW (ref 3.80–5.20)
RDW: 15.2 % — ABNORMAL HIGH (ref 11.5–14.5)
WBC: 5.1 10*3/uL (ref 3.6–11.0)

## 2016-12-06 LAB — COMPREHENSIVE METABOLIC PANEL
ALK PHOS: 118 U/L (ref 38–126)
ALT: 23 U/L (ref 14–54)
ANION GAP: 13 (ref 5–15)
AST: 36 U/L (ref 15–41)
Albumin: 3.3 g/dL — ABNORMAL LOW (ref 3.5–5.0)
BUN: 31 mg/dL — ABNORMAL HIGH (ref 6–20)
CHLORIDE: 98 mmol/L — AB (ref 101–111)
CO2: 27 mmol/L (ref 22–32)
CREATININE: 3.13 mg/dL — AB (ref 0.44–1.00)
Calcium: 8.4 mg/dL — ABNORMAL LOW (ref 8.9–10.3)
GFR, EST AFRICAN AMERICAN: 14 mL/min — AB (ref >60)
GFR, EST NON AFRICAN AMERICAN: 12 mL/min — AB (ref >60)
Glucose, Bld: 165 mg/dL — ABNORMAL HIGH (ref 65–99)
Potassium: 3.6 mmol/L (ref 3.5–5.1)
Sodium: 138 mmol/L (ref 135–145)
Total Bilirubin: 1.2 mg/dL (ref 0.3–1.2)
Total Protein: 6.1 g/dL — ABNORMAL LOW (ref 6.5–8.1)

## 2016-12-06 MED ORDER — OCTREOTIDE ACETATE 30 MG IM KIT
30.0000 mg | PACK | Freq: Once | INTRAMUSCULAR | Status: AC
  Administered 2016-12-06: 30 mg via INTRAMUSCULAR
  Filled 2016-12-06: qty 1

## 2016-12-06 NOTE — Assessment & Plan Note (Signed)
#   METASTATIC CARCINOID of ovary; on sandostatin q monthly. Clinically asymptomatic from carcinoid syndrome. No evidence of any progression clinically.   #Tolerating Sandostatin without any major side effects. Imaging discussed - plan to hold off given her age and multiple comorbidities.   # Mild thrombocytopenia- today- 102/slow trend down; also on coumadin sec to A.fib. No spleen [[id number] CT scan]; discussed re: BMBx; will hold off.   # Vaginal/hematuria- 1 episode/ urology- eval neg; US pelvis- Neg [s/p TAH]; monitor closely.   # ANEMIA- /CKD/ on HD- hb 12/improved. On PO iron.   # dementia- mild- stable.   # Afib  On coumadin- stable. Monitor closely given mild worsening thrombocytopenia.   # follow up in 3 months/ labs.chromgranin A continue Sandostatin monthly.

## 2016-12-06 NOTE — Progress Notes (Signed)
Here for follow up. Has recently moved to [place] . Per daughter eating has improved. Bilateral lower extremity edema noted -per daughter greatly improved. Been going to dialysis and fluid retention improved-went 4 x last week as well as 4 x this week.

## 2016-12-06 NOTE — Assessment & Plan Note (Signed)
#   METASTATIC CARCINOID of ovary; on sandostatin q monthly. Clinically asymptomatic from carcinoid syndrome. No evidence of any progression clinically.   #Tolerating Sandostatin without any major side effects. Imaging discussed - plan to hold off given her age and multiple comorbidities.   # ANEMIA- /CKD/ on HD- hb 11.8/improved. On PO iron.   # dementia- mild- stable.   # Afib  On coumadin- stable. No falls.   # follow up in 3 months/ labs.chromgranin A continue Sandostatin monthly.

## 2016-12-06 NOTE — Progress Notes (Signed)
[facility] OFFICE PROGRESS NOTE  Patient Care Team: Kirk Ruths, MD as PCP - General (Internal Medicine)  Cancer Staging No matching staging information was found for the patient.   Oncology History   1. Ovarian cystic mass underwent robotic bilateral salpingo-oophorectomy in April of 2011 [in ? [place]] Neuroendocrine tumor, poorly differentiated carcinoid 2. Octreotide scan revealed mets to retro peritoneal lymph node. Patient underwent lymph node dissection in July of 2011. 3. Patient has been started on Sandostatin LAR from September of 2012 because of symptoms of carcinoid syndrome.  # dementia- mild; ESRD- on HD [Dr.Kolluru]      INTERVAL HISTORY:Patient is a fair historian at best; accompanied by her daughter.   Kerry Walsh 83 y.o.  female pleasant patient above history of Carcinoid of the ovary is here for follow-up; patient is currently on Sandostatin.   The interim patient had episode of blood- vaginal versus urinary. Evaluation with urology-no obvious source noted. She also had a pelvic ultrasound that showed absence of uterus [prior TAH]; otherwise no other pelvic abnormality. The bleeding has since resolved.  Patient is currently on dialysis 4 times a week given fluid retention. She seems to be tolerating dialysis fairly well. As per the family patient noted to have increasing bruising. Otherwise no gum bleeding.  Patient denies diarrhea. Denies any wheezing or shortness of breath or cough. Appetite is good. No weight loss. No falls. Patient continues on Coumadin.   REVIEW OF SYSTEMS:  A complete 10 point review of system is done which is negative except mentioned above/history of present illness.   PAST MEDICAL HISTORY :  Past Medical History:  Diagnosis Date  . Anemia   . Anxiety   . Arthritis   . Atrial fibrillation ([place])   . Cancer [facility])    Carcinoid Tumor  . CHF (congestive heart failure) ([place])   . Chronic kidney disease     CKD with last known creatinine fo 2.4 in Dec 2015  . Coronary artery disease   . Dementia   . Depression   . Dialysis patient [facility])    Mon. Wed. Fri.  . Dysrhythmia    Atrial Fibrillation  . Hypertension   . Hypothyroidism   . Malignant poorly differentiated neuroendocrine carcinoma ([place]) 08/12/2014  . Mitral valve regurgitation   . Osteoporosis   . Stroke ([place])    No residual neurological deficits    PAST SURGICAL HISTORY :   Past Surgical History:  Procedure Laterality Date  . ABDOMINAL HYSTERECTOMY    . AV FISTULA PLACEMENT Left 01/19/2016   Procedure: INSERTION OF ARTERIOVENOUS (AV) GORE-TEX GRAFT ARM ( BRACH / AXILLARY );  Surgeon: Katha Cabal, MD;  Location: [facility] ORS;  Service: Vascular;  Laterality: Left;  . BACK SURGERY    . CHOLECYSTECTOMY    . EYE SURGERY Bilateral    Cataract Extraction with IOL  . JOINT REPLACEMENT Bilateral    Knee replacement surgery- bilateral  . PERIPHERAL VASCULAR CATHETERIZATION N/A 07/27/2015   Procedure: Dialysis/Perma Catheter Insertion;  Surgeon: Katha Cabal, MD;  Location: [facility];  Service: Cardiovascular;  Laterality: N/A;  . PERIPHERAL VASCULAR CATHETERIZATION N/A 03/21/2016   Procedure: Dialysis/Perma Catheter Removal;  Surgeon: Katha Cabal, MD;  Location: [facility] CV LAB;  Service: Cardiovascular;  Laterality: N/A;  . SHOULDER SURGERY Bilateral     FAMILY HISTORY :   Family History  Problem Relation Age of Onset  . CAD Father   . Stroke Father   .  Bladder Cancer Father   . Kidney failure Mother   . Kidney cancer Neg Hx     SOCIAL HISTORY:   Social History  Substance Use Topics  . Smoking status: Never Smoker  . Smokeless tobacco: Never Used  . Alcohol use No    ALLERGIES:  is allergic to penicillins.  MEDICATIONS:  Current Outpatient Prescriptions  Medication Sig Dispense Refill  . amLODipine (NORVASC) 5 MG tablet Take 5 mg by mouth every morning.     . calcitRIOL (ROCALTROL)  0.25 MCG capsule Take 0.25 mcg by mouth daily.    Marland Kitchen levothyroxine (SYNTHROID, LEVOTHROID) 88 MCG tablet Take 88 mcg by mouth daily before breakfast.     . metoprolol (LOPRESSOR) 50 MG tablet Take 1 tablet (50 mg total) by mouth 2 (two) times daily. (Patient taking differently: Take 50 mg by mouth daily. ) 60 tablet 0  . torsemide (DEMADEX) 10 MG tablet Take 10 mg by mouth daily.     Marland Kitchen warfarin (COUMADIN) 3 MG tablet Take 3 mg by mouth at bedtime.    Marland Kitchen acetaminophen (TYLENOL) 325 MG tablet Take 650 mg by mouth 4 (four) times daily as needed for mild pain or moderate pain.    . traMADol (ULTRAM) 50 MG tablet Take 50 mg by mouth every 4 (four) hours as needed for moderate pain.     No current facility-administered medications for this visit.     PHYSICAL EXAMINATION: ECOG PERFORMANCE STATUS: 0 - Asymptomatic  BP (!) 147/65 (BP Location: Right Arm, Patient Position: Sitting)   Pulse 71   Temp (!) 97.2 F (36.2 C) (Tympanic)   Resp 18   Wt 151 lb 6.4 oz (68.7 kg)   BMI 25.99 kg/m   Filed Weights   12/06/16 1039  Weight: 151 lb 6.4 oz (68.7 kg)    GENERAL: Frail-appearing thin built Caucasian female patient is walking herself; Alert, no distress and comfortable. Accompanied by daughter/Husband. EYES: no pallor or icterus; positive for conjunctival injection bilaterally. OROPHARYNX: no thrush or ulceration; good dentition  NECK: supple, no masses felt LYMPH:  no palpable lymphadenopathy in the cervical, axillary or inguinal regions LUNGS: clear to auscultation and  No wheeze or crackles HEART/CVS: regular rate & rhythm and no murmurs; No lower extremity edema ABDOMEN:abdomen soft, non-tender and normal bowel sounds Musculoskeletal:no cyanosis of digits and no clubbing  PSYCH: alert & oriented x 3 with fluent speech NEURO: no focal motor/sensory deficits SKIN:  no rashes or significant lesions  LABORATORY DATA:  I have reviewed the data as listed    Component Value Date/Time   NA  138 12/06/2016 1000   NA 135 05/20/2014 1402   K 3.6 12/06/2016 1000   K 4.5 05/20/2014 1402   CL 98 (L) 12/06/2016 1000   CL 106 05/20/2014 1402   CO2 27 12/06/2016 1000   CO2 22 05/20/2014 1402   GLUCOSE 165 (H) 12/06/2016 1000   GLUCOSE 91 05/20/2014 1402   BUN 31 (H) 12/06/2016 1000   BUN 61 (H) 05/20/2014 1402   CREATININE 3.13 (H) 12/06/2016 1000   CREATININE 2.72 (H) 05/20/2014 1402   CALCIUM 8.4 (L) 12/06/2016 1000   CALCIUM 8.7 (L) 05/20/2014 1402   PROT 6.1 (L) 12/06/2016 1000   PROT 7.0 05/20/2014 1402   ALBUMIN 3.3 (L) 12/06/2016 1000   ALBUMIN 4.1 05/20/2014 1402   AST 36 12/06/2016 1000   AST 23 05/20/2014 1402   ALT 23 12/06/2016 1000   ALT 12 (L) 05/20/2014 1402  ALKPHOS 118 12/06/2016 1000   ALKPHOS 90 05/20/2014 1402   BILITOT 1.2 12/06/2016 1000   BILITOT 0.5 05/20/2014 1402   GFRNONAA 12 (L) 12/06/2016 1000   GFRNONAA 15 (L) 05/20/2014 1402   GFRAA 14 (L) 12/06/2016 1000   GFRAA 18 (L) 05/20/2014 1402    No results found for: SPEP, UPEP  Lab Results  Component Value Date   WBC 5.1 12/06/2016   NEUTROABS 3.5 12/06/2016   HGB 12.2 12/06/2016   HCT 36.7 12/06/2016   MCV 97.6 12/06/2016   PLT 102 (L) 12/06/2016      Chemistry      Component Value Date/Time   NA 138 12/06/2016 1000   NA 135 05/20/2014 1402   K 3.6 12/06/2016 1000   K 4.5 05/20/2014 1402   CL 98 (L) 12/06/2016 1000   CL 106 05/20/2014 1402   CO2 27 12/06/2016 1000   CO2 22 05/20/2014 1402   BUN 31 (H) 12/06/2016 1000   BUN 61 (H) 05/20/2014 1402   CREATININE 3.13 (H) 12/06/2016 1000   CREATININE 2.72 (H) 05/20/2014 1402      Component Value Date/Time   CALCIUM 8.4 (L) 12/06/2016 1000   CALCIUM 8.7 (L) 05/20/2014 1402   ALKPHOS 118 12/06/2016 1000   ALKPHOS 90 05/20/2014 1402   AST 36 12/06/2016 1000   AST 23 05/20/2014 1402   ALT 23 12/06/2016 1000   ALT 12 (L) 05/20/2014 1402   BILITOT 1.2 12/06/2016 1000   BILITOT 0.5 05/20/2014 1402       RADIOGRAPHIC  STUDIES: I have personally reviewed the radiological images as listed and agreed with the findings in the report. No results found.   ASSESSMENT & PLAN:   Malignant poorly differentiated neuroendocrine carcinoma ([place]) # METASTATIC CARCINOID of ovary; on sandostatin q monthly. Clinically asymptomatic from carcinoid syndrome. No evidence of any progression clinically.   #Tolerating Sandostatin without any major side effects. Imaging discussed - plan to hold off given her age and multiple comorbidities.   # ANEMIA- /CKD/ on HD- hb 11.8/improved. On PO iron.   # dementia- mild- stable.   # Afib  On coumadin- stable. No falls.   # follow up in 3 months/ labs.chromgranin A continue Sandostatin monthly.   Carcinoid tumor of ovary, malignant ([place]) # METASTATIC CARCINOID of ovary; on sandostatin q monthly. Clinically asymptomatic from carcinoid syndrome. No evidence of any progression clinically.   #Tolerating Sandostatin without any major side effects. Imaging discussed - plan to hold off given her age and multiple comorbidities.   # Mild thrombocytopenia- today- 102/slow trend down; also on coumadin sec to A.fib. No spleen [[id number] CT scan]; discussed re: BMBx; will hold off.   # Vaginal/hematuria- 1 episode/ urology- eval neg; US pelvis- Neg [s/p TAH]; monitor closely.   # ANEMIA- /CKD/ on HD- hb 12/improved. On PO iron.   # dementia- mild- stable.   # Afib  On coumadin- stable. Monitor closely given mild worsening thrombocytopenia.   # follow up in 3 months/ labs.chromgranin A continue Sandostatin monthly.     Orders Placed This Encounter  Procedures  . CBC with Differential/Platelet    Standing Status:   Future    Standing Expiration Date:   12/06/2017  . Comprehensive metabolic panel    Standing Status:   Future    Standing Expiration Date:   12/06/2017   All questions were answered. The patient knows to call the clinic with any problems, questions or concerns.  Cammie Sickle, MD 12/06/2016 1:47 PM

## 2016-12-07 LAB — CHROMOGRANIN A: CHROMOGRANIN A: 48 nmol/L — AB (ref 0–5)

## 2016-12-10 ENCOUNTER — Inpatient Hospital Stay
Admission: EM | Admit: 2016-12-10 | Discharge: 2016-12-12 | DRG: 291 | Disposition: A | Discharge status: Home Health | Payer: Medicare Other | Attending: Internal Medicine | Admitting: Internal Medicine

## 2016-12-10 ENCOUNTER — Emergency Department: Payer: Medicare Other

## 2016-12-10 ENCOUNTER — Other Ambulatory Visit: Payer: Self-pay

## 2016-12-10 DIAGNOSIS — N186 End stage renal disease: Secondary | ICD-10-CM | POA: Diagnosis present

## 2016-12-10 DIAGNOSIS — M1990 Unspecified osteoarthritis, unspecified site: Secondary | ICD-10-CM | POA: Diagnosis present

## 2016-12-10 DIAGNOSIS — R7989 Other specified abnormal findings of blood chemistry: Secondary | ICD-10-CM

## 2016-12-10 DIAGNOSIS — I132 Hypertensive heart and chronic kidney disease with heart failure and with stage 5 chronic kidney disease, or end stage renal disease: Secondary | ICD-10-CM | POA: Diagnosis present

## 2016-12-10 DIAGNOSIS — I340 Nonrheumatic mitral (valve) insufficiency: Secondary | ICD-10-CM | POA: Diagnosis present

## 2016-12-10 DIAGNOSIS — N2581 Secondary hyperparathyroidism of renal origin: Secondary | ICD-10-CM | POA: Diagnosis present

## 2016-12-10 DIAGNOSIS — I5023 Acute on chronic systolic (congestive) heart failure: Secondary | ICD-10-CM | POA: Diagnosis present

## 2016-12-10 DIAGNOSIS — I2510 Atherosclerotic heart disease of native coronary artery without angina pectoris: Secondary | ICD-10-CM | POA: Diagnosis present

## 2016-12-10 DIAGNOSIS — Z992 Dependence on renal dialysis: Secondary | ICD-10-CM

## 2016-12-10 DIAGNOSIS — I480 Paroxysmal atrial fibrillation: Secondary | ICD-10-CM | POA: Diagnosis present

## 2016-12-10 DIAGNOSIS — Z880 Allergy status to penicillin: Secondary | ICD-10-CM

## 2016-12-10 DIAGNOSIS — Z8673 Personal history of transient ischemic attack (TIA), and cerebral infarction without residual deficits: Secondary | ICD-10-CM

## 2016-12-10 DIAGNOSIS — N3281 Overactive bladder: Secondary | ICD-10-CM | POA: Diagnosis present

## 2016-12-10 DIAGNOSIS — F0390 Unspecified dementia without behavioral disturbance: Secondary | ICD-10-CM | POA: Diagnosis present

## 2016-12-10 DIAGNOSIS — Z7901 Long term (current) use of anticoagulants: Secondary | ICD-10-CM

## 2016-12-10 DIAGNOSIS — I509 Heart failure, unspecified: Secondary | ICD-10-CM

## 2016-12-10 DIAGNOSIS — Z96653 Presence of artificial knee joint, bilateral: Secondary | ICD-10-CM | POA: Diagnosis present

## 2016-12-10 DIAGNOSIS — E785 Hyperlipidemia, unspecified: Secondary | ICD-10-CM | POA: Diagnosis present

## 2016-12-10 DIAGNOSIS — Z79899 Other long term (current) drug therapy: Secondary | ICD-10-CM

## 2016-12-10 DIAGNOSIS — M810 Age-related osteoporosis without current pathological fracture: Secondary | ICD-10-CM | POA: Diagnosis present

## 2016-12-10 DIAGNOSIS — D631 Anemia in chronic kidney disease: Secondary | ICD-10-CM | POA: Diagnosis present

## 2016-12-10 DIAGNOSIS — R0602 Shortness of breath: Secondary | ICD-10-CM | POA: Diagnosis present

## 2016-12-10 DIAGNOSIS — E039 Hypothyroidism, unspecified: Secondary | ICD-10-CM | POA: Diagnosis present

## 2016-12-10 DIAGNOSIS — I248 Other forms of acute ischemic heart disease: Secondary | ICD-10-CM | POA: Diagnosis present

## 2016-12-10 DIAGNOSIS — I5033 Acute on chronic diastolic (congestive) heart failure: Secondary | ICD-10-CM | POA: Diagnosis present

## 2016-12-10 DIAGNOSIS — R778 Other specified abnormalities of plasma proteins: Secondary | ICD-10-CM

## 2016-12-10 DIAGNOSIS — I447 Left bundle-branch block, unspecified: Secondary | ICD-10-CM | POA: Diagnosis present

## 2016-12-10 LAB — CBC WITH DIFFERENTIAL/PLATELET
BASOS ABS: 0.1 10*3/uL (ref 0–0.1)
BASOS PCT: 1 %
Eosinophils Absolute: 0.1 10*3/uL (ref 0–0.7)
Eosinophils Relative: 2 %
HEMATOCRIT: 37.7 % (ref 35.0–47.0)
HEMOGLOBIN: 12.4 g/dL (ref 12.0–16.0)
LYMPHS PCT: 12 %
Lymphs Abs: 0.7 10*3/uL — ABNORMAL LOW (ref 1.0–3.6)
MCH: 32.6 pg (ref 26.0–34.0)
MCHC: 33 g/dL (ref 32.0–36.0)
MCV: 98.7 fL (ref 80.0–100.0)
Monocytes Absolute: 0.4 10*3/uL (ref 0.2–0.9)
Monocytes Relative: 7 %
NEUTROS ABS: 4.4 10*3/uL (ref 1.4–6.5)
NEUTROS PCT: 78 %
Platelets: 99 10*3/uL — ABNORMAL LOW (ref 150–440)
RBC: 3.82 MIL/uL (ref 3.80–5.20)
RDW: 15.1 % — ABNORMAL HIGH (ref 11.5–14.5)
WBC: 5.7 10*3/uL (ref 3.6–11.0)

## 2016-12-10 LAB — TROPONIN I
Troponin I: 0.11 ng/mL (ref <0.03)
Troponin I: 0.12 ng/mL (ref <0.03)
Troponin I: 0.11 ng/mL (ref <0.03)
Troponin I: 0.12 ng/mL (ref <0.03)

## 2016-12-10 LAB — HEPATIC FUNCTION PANEL
ALBUMIN: 3.4 g/dL — AB (ref 3.5–5.0)
ALT: 22 U/L (ref 14–54)
AST: 28 U/L (ref 15–41)
Alkaline Phosphatase: 113 U/L (ref 38–126)
BILIRUBIN DIRECT: 0.3 mg/dL (ref 0.1–0.5)
BILIRUBIN TOTAL: 1 mg/dL (ref 0.3–1.2)
Indirect Bilirubin: 0.7 mg/dL (ref 0.3–0.9)
Total Protein: 6.2 g/dL — ABNORMAL LOW (ref 6.5–8.1)

## 2016-12-10 LAB — BASIC METABOLIC PANEL
ANION GAP: 12 (ref 5–15)
BUN: 19 mg/dL (ref 6–20)
CO2: 30 mmol/L (ref 22–32)
Calcium: 8.6 mg/dL — ABNORMAL LOW (ref 8.9–10.3)
Chloride: 97 mmol/L — ABNORMAL LOW (ref 101–111)
Creatinine, Ser: 2.65 mg/dL — ABNORMAL HIGH (ref 0.44–1.00)
GFR calc non Af Amer: 15 mL/min — ABNORMAL LOW (ref >60)
GFR, EST AFRICAN AMERICAN: 17 mL/min — AB (ref >60)
GLUCOSE: 95 mg/dL (ref 65–99)
POTASSIUM: 3.3 mmol/L — AB (ref 3.5–5.1)
Sodium: 139 mmol/L (ref 135–145)

## 2016-12-10 LAB — PROTIME-INR
INR: 1.91
Prothrombin Time: 21.7 seconds — ABNORMAL HIGH (ref 11.4–15.2)

## 2016-12-10 LAB — TSH: TSH: 7.721 u[IU]/mL — ABNORMAL HIGH (ref 0.350–4.500)

## 2016-12-10 LAB — BRAIN NATRIURETIC PEPTIDE: B NATRIURETIC PEPTIDE 5: 3666 pg/mL — AB (ref 0.0–100.0)

## 2016-12-10 LAB — MAGNESIUM: MAGNESIUM: 1.8 mg/dL (ref 1.7–2.4)

## 2016-12-10 LAB — MRSA PCR SCREENING: MRSA by PCR: NEGATIVE

## 2016-12-10 MED ORDER — FUROSEMIDE 10 MG/ML IJ SOLN
80.0000 mg | Freq: Once | INTRAMUSCULAR | Status: AC
  Administered 2016-12-10: 80 mg via INTRAVENOUS
  Filled 2016-12-10: qty 8

## 2016-12-10 MED ORDER — CALCITRIOL 0.25 MCG PO CAPS
0.2500 ug | ORAL_CAPSULE | Freq: Every day | ORAL | Status: DC
  Administered 2016-12-10 – 2016-12-12 (×3): 0.25 ug via ORAL
  Filled 2016-12-10 (×3): qty 1

## 2016-12-10 MED ORDER — TORSEMIDE 20 MG PO TABS: 10.0000 mg | ORAL_TABLET | Freq: Every day | ORAL | Status: DC

## 2016-12-10 MED ORDER — TRAMADOL HCL 50 MG PO TABS
50.0000 mg | ORAL_TABLET | ORAL | Status: DC | PRN
  Administered 2016-12-10 – 2016-12-11 (×3): 50 mg via ORAL
  Filled 2016-12-10 (×4): qty 1

## 2016-12-10 MED ORDER — WARFARIN SODIUM 3 MG PO TABS
3.0000 mg | ORAL_TABLET | Freq: Every day | ORAL | Status: DC
  Administered 2016-12-10 – 2016-12-11 (×2): 3 mg via ORAL
  Filled 2016-12-10 (×3): qty 1

## 2016-12-10 MED ORDER — SODIUM CHLORIDE 0.9% FLUSH
3.0000 mL | Freq: Two times a day (BID) | INTRAVENOUS | Status: DC
  Administered 2016-12-11 – 2016-12-12 (×3): 3 mL via INTRAVENOUS

## 2016-12-10 MED ORDER — COLLOIDAL OATMEAL 1 % EX CREA: 1.0000 "application " | TOPICAL_CREAM | Freq: Two times a day (BID) | CUTANEOUS | Status: DC

## 2016-12-10 MED ORDER — WARFARIN SODIUM 3 MG PO TABS
3.0000 mg | ORAL_TABLET | Freq: Every day | ORAL | Status: DC
  Filled 2016-12-10: qty 1

## 2016-12-10 MED ORDER — LEVOTHYROXINE SODIUM 88 MCG PO TABS
88.0000 ug | ORAL_TABLET | Freq: Every day | ORAL | Status: DC
  Administered 2016-12-10 – 2016-12-12 (×3): 88 ug via ORAL
  Filled 2016-12-10 (×3): qty 1

## 2016-12-10 MED ORDER — ONDANSETRON HCL 4 MG/2ML IJ SOLN: 4.0000 mg | Freq: Four times a day (QID) | INTRAMUSCULAR | Status: DC | PRN

## 2016-12-10 MED ORDER — ACETAMINOPHEN 325 MG PO TABS: 650.0000 mg | ORAL_TABLET | Freq: Four times a day (QID) | ORAL | Status: DC | PRN

## 2016-12-10 MED ORDER — ACETAMINOPHEN 650 MG RE SUPP: 650.0000 mg | Freq: Four times a day (QID) | RECTAL | Status: DC | PRN

## 2016-12-10 MED ORDER — ONDANSETRON HCL 4 MG PO TABS: 4.0000 mg | ORAL_TABLET | Freq: Four times a day (QID) | ORAL | Status: DC | PRN

## 2016-12-10 MED ORDER — TORSEMIDE 20 MG PO TABS: 20.0000 mg | ORAL_TABLET | Freq: Every day | ORAL | Status: DC

## 2016-12-10 MED ORDER — POTASSIUM CHLORIDE CRYS ER 20 MEQ PO TBCR
20.0000 meq | EXTENDED_RELEASE_TABLET | Freq: Once | ORAL | Status: AC
  Administered 2016-12-10: 20 meq via ORAL
  Filled 2016-12-10: qty 1

## 2016-12-10 MED ORDER — WARFARIN - PHYSICIAN DOSING INPATIENT
Freq: Every day | Status: DC
  Administered 2016-12-10: 17:00:00

## 2016-12-10 MED ORDER — AMLODIPINE BESYLATE 5 MG PO TABS
5.0000 mg | ORAL_TABLET | ORAL | Status: DC
  Administered 2016-12-10 – 2016-12-12 (×3): 5 mg via ORAL
  Filled 2016-12-10 (×3): qty 1

## 2016-12-10 MED ORDER — METOPROLOL TARTRATE 50 MG PO TABS
50.0000 mg | ORAL_TABLET | Freq: Two times a day (BID) | ORAL | Status: DC
  Administered 2016-12-10 – 2016-12-12 (×4): 50 mg via ORAL
  Filled 2016-12-10 (×4): qty 1

## 2016-12-10 MED ORDER — GELOCAST UNNAS BOOT EX MISC
1.0000 "application " | CUTANEOUS | Status: DC
  Administered 2016-12-10: 1 via CUTANEOUS
  Filled 2016-12-10: qty 1

## 2016-12-10 MED ORDER — DOCUSATE SODIUM 100 MG PO CAPS
100.0000 mg | ORAL_CAPSULE | Freq: Two times a day (BID) | ORAL | Status: DC
  Administered 2016-12-10 – 2016-12-12 (×5): 100 mg via ORAL
  Filled 2016-12-10 (×5): qty 1

## 2016-12-10 MED ORDER — TORSEMIDE 20 MG PO TABS
40.0000 mg | ORAL_TABLET | Freq: Every day | ORAL | Status: DC
  Administered 2016-12-10 – 2016-12-12 (×2): 40 mg via ORAL
  Filled 2016-12-10 (×2): qty 2

## 2016-12-10 MED ORDER — ASPIRIN 81 MG PO CHEW
324.0000 mg | CHEWABLE_TABLET | Freq: Once | ORAL | Status: AC
  Administered 2016-12-10: 324 mg via ORAL
  Filled 2016-12-10: qty 4

## 2016-12-10 MED ORDER — TORSEMIDE 20 MG PO TABS
30.0000 mg | ORAL_TABLET | Freq: Every day | ORAL | Status: DC
  Filled 2016-12-10: qty 3

## 2016-12-10 NOTE — Progress Notes (Signed)
ANTICOAGULATION CONSULT NOTE - Initial Consult  Pharmacy Consult for Warfarin Dosing  Indication: atrial fibrillation  Allergies  Allergen Reactions  . Penicillins Rash    Has patient had a PCN reaction causing immediate rash, facial/tongue/throat swelling, SOB or lightheadedness with hypotension: Yes Has patient had a PCN reaction causing severe rash involving mucus membranes or skin necrosis: No Has patient had a PCN reaction that required hospitalization No Has patient had a PCN reaction occurring within the last 10 years: No If all of the above answers are "NO", then may proceed with Cephalosporin use.     Patient Measurements: Height: 5\' 3"  (160 cm) Weight: 177 lb 4.8 oz (80.4 kg) IBW/kg (Calculated) : 52.4  Vital Signs: Temp: 97.8 F (36.6 C) (10/21 1605) Temp Source: Oral (10/21 1605) BP: 111/51 (10/21 1605) Pulse Rate: 60 (10/21 1605)  Labs:  Recent Labs  12/10/16 0327 12/10/16 0720 12/10/16 1106 12/10/16 1511  HGB 12.4  --   --   --   HCT 37.7  --   --   --   PLT 99*  --   --   --   LABPROT 21.7*  --   --   --   INR 1.91  --   --   --   CREATININE 2.65*  --   --   --   TROPONINI 0.11* 0.12* 0.11* 0.12*    Estimated Creatinine Clearance: 14.5 mL/min (A) (by C-G formula based on SCr of 2.65 mg/dL (H)).   Medical History: Past Medical History:  Diagnosis Date  . Anemia   . Anxiety   . Arthritis   . Atrial fibrillation ([place])   . Cancer [facility])    Carcinoid Tumor  . CHF (congestive heart failure) ([place])   . Chronic kidney disease    CKD with last known creatinine fo 2.4 in Dec 2015  . Coronary artery disease   . Dementia   . Depression   . Dialysis patient [facility])    Mon. Wed. Fri.  . Dysrhythmia    Atrial Fibrillation  . Hypertension   . Hypothyroidism   . Malignant poorly differentiated neuroendocrine carcinoma ([place]) 08/12/2014  . Mitral valve regurgitation   . Osteoporosis   . Stroke [facility])    No residual neurological deficits    Assessment: Pharmacy consulted for warfarin dosing and monitoring for a 83 yo female with PMH of A. Fib.  INR on admission slightly subtherapeutic at 1.91  Home Regimen: Warfarin 3mg  Daily   DATE INR DOSE 10/21 1.91 3mg   Goal of Therapy:  INR 2-3 Monitor platelets by anticoagulation protocol: Yes   Plan:  INR is almost therapeutic, will continue with patient's home warfarin regimen of 3mg  tonight.  Pharmacy will F/U with AM INR and adjust dose as needed.   Pernell Dupre, PharmD, BCPS Clinical Pharmacist 12/10/2016 5:12 PM

## 2016-12-10 NOTE — H&P (Addendum)
Kerry Walsh is an 83 y.o. female.   Chief Complaint: Shortness of breath HPI: The patient with past medical history of systolic heart failure secondary to severe mitral regurgitation, ESRD on dialysis, atrial fibrillation, hypertension and hypothyroidism presents to the emergency department complaining of shortness of breath. The patient's dyspnea awoke from sleep. She admits to orthopnea but denies chest pain. The patient has not had recent fever, nausea or vomiting however she has developed swelling in her arms, legs and face. She has undergone an extra dialysis treatment this week yet continues to have swelling. Chest x-ray significant for mild interstitial edema and vascular congestion. The patient still makes urine that she was given a dose of Lasix 80 mg IV in the emergency department before the emergency department staff called the hospitalist service for admission.  Past Medical History:  Diagnosis Date  . Anemia   . Anxiety   . Arthritis   . Atrial fibrillation ([place])   . Cancer [facility])    Carcinoid Tumor  . CHF (congestive heart failure) ([place])   . Chronic kidney disease    CKD with last known creatinine fo 2.4 in Dec 2015  . Coronary artery disease   . Dementia   . Depression   . Dialysis patient [facility])    Mon. Wed. Fri.  . Dysrhythmia    Atrial Fibrillation  . Hypertension   . Hypothyroidism   . Malignant poorly differentiated neuroendocrine carcinoma ([place]) 08/12/2014  . Mitral valve regurgitation   . Osteoporosis   . Stroke [facility])    No residual neurological deficits    Past Surgical History:  Procedure Laterality Date  . ABDOMINAL HYSTERECTOMY    . AV FISTULA PLACEMENT Left 01/19/2016   Procedure: INSERTION OF ARTERIOVENOUS (AV) GORE-TEX GRAFT ARM ( BRACH / AXILLARY );  Surgeon: Katha Cabal, MD;  Location: [facility] ORS;  Service: Vascular;  Laterality: Left;  . BACK SURGERY    . CHOLECYSTECTOMY    . EYE SURGERY Bilateral    Cataract Extraction with IOL  . JOINT  REPLACEMENT Bilateral    Knee replacement surgery- bilateral  . PERIPHERAL VASCULAR CATHETERIZATION N/A 07/27/2015   Procedure: Dialysis/Perma Catheter Insertion;  Surgeon: Katha Cabal, MD;  Location: [place] CV LAB;  Service: Cardiovascular;  Laterality: N/A;  . PERIPHERAL VASCULAR CATHETERIZATION N/A 03/21/2016   Procedure: Dialysis/Perma Catheter Removal;  Surgeon: Katha Cabal, MD;  Location: [place] CV LAB;  Service: Cardiovascular;  Laterality: N/A;  . SHOULDER SURGERY Bilateral     Family History  Problem Relation Age of Onset  . CAD Father   . Stroke Father   . Bladder Cancer Father   . Kidney failure Mother   . Kidney cancer Neg Hx    Social History:  reports that she has never smoked. She has never used smokeless tobacco. She reports that she does not drink alcohol or use drugs.  Allergies:  Allergies  Allergen Reactions  . Penicillins Rash    Has patient had a PCN reaction causing immediate rash, facial/tongue/throat swelling, SOB or lightheadedness with hypotension: Yes Has patient had a PCN reaction causing severe rash involving mucus membranes or skin necrosis: No Has patient had a PCN reaction that required hospitalization No Has patient had a PCN reaction occurring within the last 10 years: No If all of the above answers are "NO", then may proceed with Cephalosporin use.     Medications Prior to Admission  Medication Sig Dispense Refill  . acetaminophen (TYLENOL) 325 MG tablet  Take 650 mg by mouth 4 (four) times daily as needed for mild pain or moderate pain.    Marland Kitchen amLODipine (NORVASC) 5 MG tablet Take 5 mg by mouth every morning.     . calcitRIOL (ROCALTROL) 0.25 MCG capsule Take 0.25 mcg by mouth daily.    . Colloidal Oatmeal (EUCERIN ECZEMA RELIEF EX) Apply 1 application topically 2 (two) times daily. Apply to feet and legs    . levothyroxine (SYNTHROID, LEVOTHROID) 88 MCG tablet Take 88 mcg by mouth daily before breakfast.     . metoprolol  (LOPRESSOR) 50 MG tablet Take 1 tablet (50 mg total) by mouth 2 (two) times daily. (Patient taking differently: Take 50 mg by mouth daily. ) 60 tablet 0  . torsemide (DEMADEX) 10 MG tablet Take 10 mg by mouth daily. Take with 20 mg tab for a total of 30 mg    . torsemide (DEMADEX) 20 MG tablet Take 20 mg by mouth daily. Take with 10 mg tab for a total of 30 mg    . traMADol (ULTRAM) 50 MG tablet Take 50 mg by mouth every 4 (four) hours as needed for moderate pain.    Marland Kitchen warfarin (COUMADIN) 3 MG tablet Take 3 mg by mouth at bedtime.    . Wound Dressings (GELOCAST UNNAS BOOT) MISC Apply 1 application topically every 3 (three) days. Apply to bilateral legs      Results for orders placed or performed during the hospital encounter of 12/10/16 (from the past 48 hour(s))  CBC with Differential/Platelet     Status: Abnormal   Collection Time: 12/10/16  3:27 AM  Result Value Ref Range   WBC 5.7 3.6 - 11.0 K/uL   RBC 3.82 3.80 - 5.20 MIL/uL   Hemoglobin 12.4 12.0 - 16.0 g/dL   HCT 37.7 35.0 - 47.0 %   MCV 98.7 80.0 - 100.0 fL   MCH 32.6 26.0 - 34.0 pg   MCHC 33.0 32.0 - 36.0 g/dL   RDW 15.1 (H) 11.5 - 14.5 %   Platelets 99 (L) 150 - 440 K/uL   Neutrophils Relative % 78 %   Neutro Abs 4.4 1.4 - 6.5 K/uL   Lymphocytes Relative 12 %   Lymphs Abs 0.7 (L) 1.0 - 3.6 K/uL   Monocytes Relative 7 %   Monocytes Absolute 0.4 0.2 - 0.9 K/uL   Eosinophils Relative 2 %   Eosinophils Absolute 0.1 0 - 0.7 K/uL   Basophils Relative 1 %   Basophils Absolute 0.1 0 - 0.1 K/uL  Basic metabolic panel     Status: Abnormal   Collection Time: 12/10/16  3:27 AM  Result Value Ref Range   Sodium 139 135 - 145 mmol/L   Potassium 3.3 (L) 3.5 - 5.1 mmol/L   Chloride 97 (L) 101 - 111 mmol/L   CO2 30 22 - 32 mmol/L   Glucose, Bld 95 65 - 99 mg/dL   BUN 19 6 - 20 mg/dL   Creatinine, Ser 2.65 (H) 0.44 - 1.00 mg/dL   Calcium 8.6 (L) 8.9 - 10.3 mg/dL   GFR calc non Af Amer 15 (L) >60 mL/Walsh   GFR calc Af Amer 17 (L) >60  mL/Walsh    Comment: (NOTE) The eGFR has been calculated using the CKD EPI equation. This calculation has not been validated in all clinical situations. eGFR's persistently <60 mL/Walsh signify possible Chronic Kidney Disease.    Anion gap 12 5 - 15  Hepatic function panel     Status:  Abnormal   Collection Time: 12/10/16  3:27 AM  Result Value Ref Range   Total Protein 6.2 (L) 6.5 - 8.1 g/dL   Albumin 3.4 (L) 3.5 - 5.0 g/dL   AST 28 15 - 41 U/L   ALT 22 14 - 54 U/L   Alkaline Phosphatase 113 38 - 126 U/L   Total Bilirubin 1.0 0.3 - 1.2 mg/dL   Bilirubin, Direct 0.3 0.1 - 0.5 mg/dL   Indirect Bilirubin 0.7 0.3 - 0.9 mg/dL  Troponin I     Status: Abnormal   Collection Time: 12/10/16  3:27 AM  Result Value Ref Range   Troponin I 0.11 (HH) <0.03 ng/mL    Comment: CRITICAL RESULT CALLED TO, READ BACK BY AND VERIFIED WITH ANN CALES AT 0406 12/10/16.PMH  Brain natriuretic peptide     Status: Abnormal   Collection Time: 12/10/16  3:27 AM  Result Value Ref Range   B Natriuretic Peptide 3,666.0 (H) 0.0 - 100.0 pg/mL  Magnesium     Status: None   Collection Time: 12/10/16  3:27 AM  Result Value Ref Range   Magnesium 1.8 1.7 - 2.4 mg/dL  Protime-INR     Status: Abnormal   Collection Time: 12/10/16  3:27 AM  Result Value Ref Range   Prothrombin Time 21.7 (H) 11.4 - 15.2 seconds   INR 1.91    Dg Chest Port 1 View  Result Date: 12/10/2016 CLINICAL DATA:  Acute dyspnea EXAM: PORTABLE CHEST 1 VIEW COMPARISON:  07/16/2015 FINDINGS: Stable cardiomegaly with thoracic aortic atherosclerosis. Central vascular congestion with interstitial edema is noted. Trace bilateral pleural effusions with slight blunting of the costophrenic angles. Left shoulder arthroplasty appears intact. Osteoarthritic native right glenohumeral joint. IMPRESSION: Stable cardiomegaly with aortic atherosclerosis. Mild central vascular congestion with interstitial edema, increased relative to prior comparison. Electronically Signed    By: Ashley Royalty M.D.   On: 12/10/2016 03:42    Review of Systems  Constitutional: Positive for malaise/fatigue. Negative for chills and fever.  HENT: Negative for sore throat and tinnitus.   Eyes: Negative for blurred vision and redness.  Respiratory: Positive for shortness of breath. Negative for cough.   Cardiovascular: Positive for orthopnea and leg swelling. Negative for chest pain, palpitations and PND.  Gastrointestinal: Negative for abdominal pain, diarrhea, nausea and vomiting.  Genitourinary: Negative for dysuria, frequency and urgency.  Musculoskeletal: Negative for joint pain and myalgias.  Skin: Negative for rash.       No lesions  Neurological: Negative for speech change, focal weakness and weakness.  Endo/Heme/Allergies: Does not bruise/bleed easily.       No temperature intolerance  Psychiatric/Behavioral: Negative for depression and suicidal ideas.    Blood pressure 125/70, pulse 70, temperature 97.6 F (36.4 C), temperature source Oral, resp. rate 18, height 5' 3"  (1.6 m), weight 80.4 kg (177 lb 4.8 oz), SpO2 96 %. Physical Exam  Vitals reviewed. Constitutional: She is oriented to person, place, and time. She appears well-developed and well-nourished. No distress.  HENT:  Head: Normocephalic and atraumatic.  Mouth/Throat: Oropharynx is clear and moist.  Eyes: Pupils are equal, round, and reactive to light. Conjunctivae and EOM are normal. No scleral icterus.  Neck: Normal range of motion. Neck supple. No JVD present. No tracheal deviation present. No thyromegaly present.  Cardiovascular: Normal rate and regular rhythm.  Exam reveals no gallop and no friction rub.   Murmur heard. Respiratory: Effort normal and breath sounds normal.  GI: Soft. Bowel sounds are normal. She exhibits no distension.  There is no tenderness.  Genitourinary:  Genitourinary Comments: Deferred  Musculoskeletal: Normal range of motion. She exhibits edema.  Lymphadenopathy:    She has no  cervical adenopathy.  Neurological: She is alert and oriented to person, place, and time. No cranial nerve deficit. She exhibits normal muscle tone.  Skin: Skin is warm and dry. No rash noted. No erythema.  Psychiatric: She has a normal mood and affect. Her behavior is normal. Judgment and thought content normal.     Assessment/Plan This is an 83 year old female admitted for CHF exacerbation.  1. CHF: Acute on chronic; systolic. The patient has severe mitral regurgitation. I have reviewed her records and she was found not to be a candidate for mitral repair or clipping. She needs diuresis but this is difficult. She still makes some urine thus I will continue Lasix IV.  2. ESRD: On dialysis; consult nephrology for continuation. The patient appears to have cardiorenal syndrome. 3. Elevated troponin: Likely secondary to decreased clearance as well as demand ischemia. No ischemic changes on EKG. Follow cardiac biomarkers. Consult cardiology. Monitor telemetry. 4. Essential hypertension: Controlled; continue amlodipine and metoprolol 5. Atrial fibrillation: Rate controlled; continue warfarin 6. Hypothyroidism: Check TSH; continue Synthroid 7. DVT prophylaxis: Full dose anticoagulation 8. GI prophylaxis: None The patient is a full code. Time spent on admission orders and patient care approximately 45 minutes  Harrie Foreman, MD 12/10/2016, 7:02 AM

## 2016-12-10 NOTE — Consult Note (Signed)
[facility] Consultation Note  Patient ID: Kerry Walsh, MRN: [id number], DOB/AGE: 11/18/1925 83 y.o. Admit date: 12/10/2016   Date of Consult: 12/10/2016 Primary Physician: Orvis Brill, [facility] Primary Cardiologist: Paraschos  Chief Complaint:  Chief Complaint  Patient presents with  . Shortness of Breath   Reason for Consult: heart failure  HPI: 83 y.o. female with known paroxysmal nonvalvular atrial fibrillation with severe mitral regurgitation likely contributing to this issue and acute on chronic systolic dysfunction congestive heart failure with ejection fraction of 40% by echocardiogram earlier this year. The patient has end-stage renal disease on dialysis and does appear to have some fluid retention with pulmonary edema shortness of breath and weight gain which may need further ultrafiltration length and and/or treatment as per nephrology. The patient has had coronary artery disease but no evidence of chest discomfort but has some back pain which may be unrelated. The patient also has malignant hypertension upon arrival likely due to her fluid retention. She currently has been on amlodipine metoprolol combination for her hypertension control which is improved today. Additionally she is on warfarin for further risk reduction in stroke with atrial fibrillation without apparent evidence of bleeding complications at this time. Heart rate has been well-controlled with metoprolol of atrial fibrillation and does not need changes. Troponin elevation of 0.12 is more consistent with demand ischemia rather than acute coronary syndrome.  Past Medical History:  Diagnosis Date  . Anemia   . Anxiety   . Arthritis   . Atrial fibrillation ([place])   . Cancer [facility])    Carcinoid Tumor  . CHF (congestive heart failure) ([place])   . Chronic kidney disease    CKD with last known creatinine fo 2.4 in Dec 2015  . Coronary artery disease   . Dementia   . Depression   . Dialysis  patient [facility])    Mon. Wed. Fri.  . Dysrhythmia    Atrial Fibrillation  . Hypertension   . Hypothyroidism   . Malignant poorly differentiated neuroendocrine carcinoma ([place]) 08/12/2014  . Mitral valve regurgitation   . Osteoporosis   . Stroke [facility])    No residual neurological deficits      Surgical History:  Past Surgical History:  Procedure Laterality Date  . ABDOMINAL HYSTERECTOMY    . AV FISTULA PLACEMENT Left 01/19/2016   Procedure: INSERTION OF ARTERIOVENOUS (AV) GORE-TEX GRAFT ARM ( BRACH / AXILLARY );  Surgeon: Katha Cabal, MD;  Location: [facility] ORS;  Service: Vascular;  Laterality: Left;  . BACK SURGERY    . CHOLECYSTECTOMY    . EYE SURGERY Bilateral    Cataract Extraction with IOL  . JOINT REPLACEMENT Bilateral    Knee replacement surgery- bilateral  . PERIPHERAL VASCULAR CATHETERIZATION N/A 07/27/2015   Procedure: Dialysis/Perma Catheter Insertion;  Surgeon: Katha Cabal, MD;  Location: [place] CV LAB;  Service: Cardiovascular;  Laterality: N/A;  . PERIPHERAL VASCULAR CATHETERIZATION N/A 03/21/2016   Procedure: Dialysis/Perma Catheter Removal;  Surgeon: Katha Cabal, MD;  Location: [place] CV LAB;  Service: Cardiovascular;  Laterality: N/A;  . SHOULDER SURGERY Bilateral      Home Meds: Prior to Admission medications   Medication Sig Start Date End Date Taking? Authorizing Provider  acetaminophen (TYLENOL) 325 MG tablet Take 650 mg by mouth 4 (four) times daily as needed for mild pain or moderate pain.   Yes [provider]  amLODipine (NORVASC) 5 MG tablet Take 5 mg by mouth every morning.    Yes [provider]  calcitRIOL (ROCALTROL) 0.25 MCG capsule Take 0.25 mcg by mouth daily.   Yes [provider]  Colloidal Oatmeal (EUCERIN ECZEMA RELIEF EX) Apply 1 application topically 2 (two) times daily. Apply to feet and legs   Yes [provider]  levothyroxine (SYNTHROID, LEVOTHROID) 88 MCG tablet Take 88 mcg by  mouth daily before breakfast.  10/23/15  Yes [provider]  metoprolol (LOPRESSOR) 50 MG tablet Take 1 tablet (50 mg total) by mouth 2 (two) times daily. Patient taking differently: Take 50 mg by mouth daily.  09/28/14  Yes Wieting, Richard, MD  torsemide (DEMADEX) 10 MG tablet Take 10 mg by mouth daily. Take with 20 mg tab for a total of 30 mg   Yes [provider]  torsemide (DEMADEX) 20 MG tablet Take 20 mg by mouth daily. Take with 10 mg tab for a total of 30 mg   Yes [provider]  traMADol (ULTRAM) 50 MG tablet Take 50 mg by mouth every 4 (four) hours as needed for moderate pain.   Yes [provider]  warfarin (COUMADIN) 3 MG tablet Take 3 mg by mouth at bedtime.   Yes [provider]  Wound Dressings (GELOCAST UNNAS BOOT) MISC Apply 1 application topically every 3 (three) days. Apply to bilateral legs   Yes [provider]    Inpatient Medications:  . amLODipine  5 mg Oral [id number]  . calcitRIOL  0.25 mcg Oral Daily  . Colloidal Oatmeal  1 application Apply externally BID  . docusate sodium  100 mg Oral BID  . GELOCAST UNNAS BOOT  1 application Apply externally Q72H  . levothyroxine  88 mcg Oral QAC breakfast  . metoprolol tartrate  50 mg Oral BID  . torsemide  40 mg Oral Daily  . warfarin  3 mg Oral QHS  . Warfarin - Physician Dosing Inpatient   Does not apply q1800     Allergies:  Allergies  Allergen Reactions  . Penicillins Rash    Has patient had a PCN reaction causing immediate rash, facial/tongue/throat swelling, SOB or lightheadedness with hypotension: Yes Has patient had a PCN reaction causing severe rash involving mucus membranes or skin necrosis: No Has patient had a PCN reaction that required hospitalization No Has patient had a PCN reaction occurring within the last 10 years: No If all of the above answers are "NO", then may proceed with Cephalosporin use.     Social History   Social History  . Marital  status: Married    Spouse name: N/A  . Number of children: N/A  . Years of education: N/A   Occupational History  . Not on file.   Social History Main Topics  . Smoking status: Never Smoker  . Smokeless tobacco: Never Used  . Alcohol use No  . Drug use: No  . Sexual activity: No     Comment: Hysterectomy   Other Topics Concern  . Not on file   Social History Narrative  . No narrative on file     Family History  Problem Relation Age of Onset  . CAD Father   . Stroke Father   . Bladder Cancer Father   . Kidney failure Mother   . Kidney cancer Neg Hx      Review of Systems Positive for shortness of breath cough congestion of back pain Negative for: General:  chills, fever, night sweats or weight changes.  Cardiovascular: PND orthopnea syncope dizziness  Dermatological skin lesions rashes Respiratory:  Positive for congestion Urologic: Frequent urination urination at night and hematuria Abdominal: negative for nausea, vomiting, diarrhea, bright red blood per rectum, melena, or hematemesis Neurologic: negative for visual changes, and/or hearing changes  All other systems reviewed and are otherwise negative except as noted above.  Labs:  Recent Labs  12/10/16 0327 12/10/16 0720 12/10/16 1106  TROPONINI 0.11* 0.12* 0.11*   Lab Results  Component Value Date   WBC 5.7 12/10/2016   HGB 12.4 12/10/2016   HCT 37.7 12/10/2016   MCV 98.7 12/10/2016   PLT 99 (L) 12/10/2016    Recent Labs Lab 12/10/16 0327  NA 139  K 3.3*  CL 97*  CO2 30  BUN 19  CREATININE 2.65*  CALCIUM 8.6*  PROT 6.2*  BILITOT 1.0  ALKPHOS 113  ALT 22  AST 28  GLUCOSE 95   No results found for: CHOL, HDL, LDLCALC, TRIG No results found for: DDIMER  Radiology/Studies:  Dg Chest Port 1 View  Result Date: 12/10/2016 CLINICAL DATA:  Acute dyspnea EXAM: PORTABLE CHEST 1 VIEW COMPARISON:  07/16/2015 FINDINGS: Stable cardiomegaly with thoracic aortic atherosclerosis. Central vascular  congestion with interstitial edema is noted. Trace bilateral pleural effusions with slight blunting of the costophrenic angles. Left shoulder arthroplasty appears intact. Osteoarthritic native right glenohumeral joint. IMPRESSION: Stable cardiomegaly with aortic atherosclerosis. Mild central vascular congestion with interstitial edema, increased relative to prior comparison. Electronically Signed   By: Ashley Royalty M.D.   On: 12/10/2016 03:42    EKG: Atrial fibrillation with controlled ventricular rate and bundle branch block  Weights: Filed Weights   12/10/16 0321 12/10/16 0634  Weight: 68.5 kg (151 lb) 80.4 kg (177 lb 4.8 oz)     Physical Exam: Blood pressure 128/64, pulse 66, temperature (!) 97.5 F (36.4 C), resp. rate 20, height 5\' 3"  (1.6 m), weight 80.4 kg (177 lb 4.8 oz), SpO2 100 %. Body mass index is 31.41 kg/m. General: Well developed, well nourished, in no acute distress. Head eyes ears nose throat: Normocephalic, atraumatic, sclera non-icteric, no xanthomas, nares are without discharge. No apparent thyromegaly and/or mass  Lungs: Normal respiratory effort.  no wheezes, no rales, no rhonchi.  Heart: Irregular with normal S1 S2. 2+ apical murmur gallop, no rub, PMI is normal size and placement, carotid upstroke normal without bruit, jugular venous pressure is normal Abdomen: Soft, non-tender, non-distended with normoactive bowel sounds. No hepatomegaly. No rebound/guarding. No obvious abdominal masses. Abdominal aorta is normal size without bruit Extremities: 1+ edema. no cyanosis, no clubbing, no ulcers  Peripheral : 2+ bilateral upper extremity pulses, 2+ bilateral femoral pulses, 2+ bilateral dorsal pedal pulse Neuro: Alert and oriented. No facial asymmetry. No focal deficit. Moves all extremities spontaneously. Musculoskeletal: Normal muscle tone without kyphosis Psych:  Responds to questions appropriately with a normal affect.    Assessment: 83 year old female with acute  on chronic systolic dysfunction congestive heart failure multifactorial in nature including end-stage renal disease severe mitral regurgitation and essential hypertension without evidence of myocardial infarction at this time  Plan: 1. Continue metoprolol for better heart rate control of atrial fibrillation without change 2. Hypertension control with amlodipine 3. Warfarin for further risk reduction in stroke with atrial fibrillation without change with a goal INR between 2 and 3 4. Okay to proceed to longer dialysis run if necessary for significant acute on chronic systolic dysfunction heart failure and edema 5. No further cardiac diagnostics necessary at this time 6. No further intervention of for mitral regurgitation other than medication management listed  above  Signed, Corey Skains M.D. [facility] 12/10/2016, 3:09 PM

## 2016-12-10 NOTE — Progress Notes (Signed)
The patient has better shortness breath and cough. On O2 [place] 2L. Vital signs are stable. Physical examination: bilateral basilar mild rales. Continue current treatment. Changed to torsemide and hemodialysis tomorrow per Dr. Juleen China.  I discussed with Dr. Juleen China, the patient's husband and daughter. Time spent about 30 minutes.

## 2016-12-10 NOTE — ED Provider Notes (Signed)
[facility] Emergency Department Provider Note    First MD Initiated Contact with Patient 12/10/16 [phone]     (approximate)  I have reviewed the triage vital signs and the nursing notes.   HISTORY  Chief Complaint Shortness of Breath    HPI Kerry Walsh is a 83 y.o. female with multiple comorbidities as well as congestive heart failure as well as chronic kidney disease on Tuesday Thursday Saturday dialysis with recent increase dialysis requirement due to worsening edema.  Patient had to have 4 sessions this week and woke up suddenly in the middle the night with worsening shortness of breath and orthopnea.  Does not wear home oxygen but was reportedly short of breath with pulse ox readings down to the mid 80s with EMS therefore she was placed on oxygen.  Has not noted any change in urine output.  She does still take diuretics.  But denies any chest pain.  Has had a cough but no fevers.  Past Medical History:  Diagnosis Date  . Anemia   . Anxiety   . Arthritis   . Atrial fibrillation ([place])   . Cancer [facility])    Carcinoid Tumor  . CHF (congestive heart failure) ([place])   . Chronic kidney disease    CKD with last known creatinine fo 2.4 in Dec 2015  . Coronary artery disease   . Dementia   . Depression   . Dialysis patient [facility])    Mon. Wed. Fri.  . Dysrhythmia    Atrial Fibrillation  . Hypertension   . Hypothyroidism   . Malignant poorly differentiated neuroendocrine carcinoma ([place]) 08/12/2014  . Mitral valve regurgitation   . Osteoporosis   . Stroke [facility])    No residual neurological deficits   Family History  Problem Relation Age of Onset  . CAD Father   . Stroke Father   . Bladder Cancer Father   . Kidney failure Mother   . Kidney cancer Neg Hx    Past Surgical History:  Procedure Laterality Date  . ABDOMINAL HYSTERECTOMY    . AV FISTULA PLACEMENT Left 01/19/2016   Procedure: INSERTION OF ARTERIOVENOUS (AV) GORE-TEX GRAFT ARM ( BRACH /  AXILLARY );  Surgeon: Katha Cabal, MD;  Location: [facility] ORS;  Service: Vascular;  Laterality: Left;  . BACK SURGERY    . CHOLECYSTECTOMY    . EYE SURGERY Bilateral    Cataract Extraction with IOL  . JOINT REPLACEMENT Bilateral    Knee replacement surgery- bilateral  . PERIPHERAL VASCULAR CATHETERIZATION N/A 07/27/2015   Procedure: Dialysis/Perma Catheter Insertion;  Surgeon: Katha Cabal, MD;  Location: [place] CV LAB;  Service: Cardiovascular;  Laterality: N/A;  . PERIPHERAL VASCULAR CATHETERIZATION N/A 03/21/2016   Procedure: Dialysis/Perma Catheter Removal;  Surgeon: Katha Cabal, MD;  Location: [place] CV LAB;  Service: Cardiovascular;  Laterality: N/A;  . SHOULDER SURGERY Bilateral    Patient Active Problem List   Diagnosis Date Noted  . Hyperlipidemia 02/29/2016  . ESRD on dialysis ([place]) 02/29/2016  . 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 MI (mitral incompetence) 12/25/2014  . Chronic systolic heart failure ([place]) 10/01/2014  . Elevated troponin 09/25/2014  . HTN (hypertension) 09/25/2014  . Acute CHF ([place]) 09/25/2014  . Essential (primary) hypertension 09/25/2014  . Dizziness 06/28/2014  . A-fib ([place]) 06/28/2014  . Dizziness and giddiness 06/28/2014  . Osteoporosis, post-menopausal 03/09/2014  . ADH disorder ([place]) 01/23/2014  . Disorder of posterior pituitary ([place]) 01/23/2014  .  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 Infected prosthetic knee joint ([place]) 01/12/2014  . Infection and inflammatory reaction due to internal prosthetic device, implant, and graft 01/12/2014  . Arteriosclerosis of coronary artery 08/23/2013  . Hypothyroid 08/23/2013      Prior to Admission medications   Medication Sig Start Date End Date Taking? Authorizing Provider  acetaminophen (TYLENOL) 325 MG tablet Take 650 mg by mouth 4 (four) times daily as needed for mild pain or moderate pain.    [provider]   amLODipine (NORVASC) 5 MG tablet Take 5 mg by mouth every morning.     [provider]  calcitRIOL (ROCALTROL) 0.25 MCG capsule Take 0.25 mcg by mouth daily.    [provider]  levothyroxine (SYNTHROID, LEVOTHROID) 88 MCG tablet Take 88 mcg by mouth daily before breakfast.  10/23/15   [provider]  metoprolol (LOPRESSOR) 50 MG tablet Take 1 tablet (50 mg total) by mouth 2 (two) times daily. Patient taking differently: Take 50 mg by mouth daily.  09/28/14   Loletha Grayer, MD  torsemide (DEMADEX) 10 MG tablet Take 10 mg by mouth daily.     [provider]  traMADol (ULTRAM) 50 MG tablet Take 50 mg by mouth every 4 (four) hours as needed for moderate pain.    [provider]  warfarin (COUMADIN) 3 MG tablet Take 3 mg by mouth at bedtime.    [provider]    Allergies Penicillins    Social History Social History  Substance Use Topics  . Smoking status: Never Smoker  . Smokeless tobacco: Never Used  . Alcohol use No    Review of Systems Patient denies headaches, rhinorrhea, blurry vision, numbness, shortness of breath, chest pain, edema, cough, abdominal pain, nausea, vomiting, diarrhea, dysuria, fevers, rashes or hallucinations unless otherwise stated above in HPI. ____________________________________________   PHYSICAL EXAM:  VITAL SIGNS: Vitals:   12/10/16 0400 12/10/16 0415  BP: (!) 149/69   Pulse: 70 64  Resp: 20 18  Temp:    SpO2: 97% 99%    Constitutional: Alert and oriented. Ill appearing, in mild respiratory distress  Eyes: Conjunctivae are normal.  Head: Atraumatic. Nose: No congestion/rhinnorhea. Mouth/Throat: Mucous membranes are moist.   Neck: No stridor. Painless ROM.  Cardiovascular: Normal rate, regular rhythm. Grossly normal heart sounds.  Good peripheral circulation. Respiratory: tachypneic, speaking in shortened phrases, crackles in posterior lung fields Gastrointestinal: Soft and nontender. No  distention. No abdominal bruits. No CVA tenderness. Musculoskeletal: No lower extremity tenderness, +  BLE edema No joint effusions. Neurologic:  Normal speech and language. No gross focal neurologic deficits are appreciated. No facial droop Skin:  Skin is warm, dry and intact. No rash noted. Psychiatric: Mood and affect are normal. Speech and behavior are normal.  ____________________________________________   LABS (all labs ordered are listed, but only abnormal results are displayed)  Results for orders placed or performed during the hospital encounter of 12/10/16 (from the past 24 hour(s))  CBC with Differential/Platelet     Status: Abnormal   Collection Time: 12/10/16  3:27 AM  Result Value Ref Range   WBC 5.7 3.6 - 11.0 K/uL   RBC 3.82 3.80 - 5.20 MIL/uL   Hemoglobin 12.4 12.0 - 16.0 g/dL   HCT 37.7 35.0 - 47.0 %   MCV 98.7 80.0 - 100.0 fL   MCH 32.6 26.0 - 34.0 pg   MCHC 33.0 32.0 - 36.0 g/dL   RDW 15.1 (H) 11.5 -  14.5 %   Platelets 99 (L) 150 - 440 K/uL   Neutrophils Relative % 78 %   Neutro Abs 4.4 1.4 - 6.5 K/uL   Lymphocytes Relative 12 %   Lymphs Abs 0.7 (L) 1.0 - 3.6 K/uL   Monocytes Relative 7 %   Monocytes Absolute 0.4 0.2 - 0.9 K/uL   Eosinophils Relative 2 %   Eosinophils Absolute 0.1 0 - 0.7 K/uL   Basophils Relative 1 %   Basophils Absolute 0.1 0 - 0.1 K/uL  Basic metabolic panel     Status: Abnormal   Collection Time: 12/10/16  3:27 AM  Result Value Ref Range   Sodium 139 135 - 145 mmol/L   Potassium 3.3 (L) 3.5 - 5.1 mmol/L   Chloride 97 (L) 101 - 111 mmol/L   CO2 30 22 - 32 mmol/L   Glucose, Bld 95 65 - 99 mg/dL   BUN 19 6 - 20 mg/dL   Creatinine, Ser 2.65 (H) 0.44 - 1.00 mg/dL   Calcium 8.6 (L) 8.9 - 10.3 mg/dL   GFR calc non Af Amer 15 (L) >60 mL/min   GFR calc Af Amer 17 (L) >60 mL/min   Anion gap 12 5 - 15  Hepatic function panel     Status: Abnormal   Collection Time: 12/10/16  3:27 AM  Result Value Ref Range   Total Protein 6.2 (L) 6.5 -  8.1 g/dL   Albumin 3.4 (L) 3.5 - 5.0 g/dL   AST 28 15 - 41 U/L   ALT 22 14 - 54 U/L   Alkaline Phosphatase 113 38 - 126 U/L   Total Bilirubin 1.0 0.3 - 1.2 mg/dL   Bilirubin, Direct 0.3 0.1 - 0.5 mg/dL   Indirect Bilirubin 0.7 0.3 - 0.9 mg/dL  Troponin I     Status: Abnormal   Collection Time: 12/10/16  3:27 AM  Result Value Ref Range   Troponin I 0.11 (HH) <0.03 ng/mL  Brain natriuretic peptide     Status: Abnormal   Collection Time: 12/10/16  3:27 AM  Result Value Ref Range   B Natriuretic Peptide 3,666.0 (H) 0.0 - 100.0 pg/mL  Magnesium     Status: None   Collection Time: 12/10/16  3:27 AM  Result Value Ref Range   Magnesium 1.8 1.7 - 2.4 mg/dL  Protime-INR     Status: Abnormal   Collection Time: 12/10/16  3:27 AM  Result Value Ref Range   Prothrombin Time 21.7 (H) 11.4 - 15.2 seconds   INR 1.91    ____________________________________________  EKG My review and personal interpretation at Time: 3:31   Indication: sob  Rate: 70  Rhythm: a fib Axis: left Other: low voltage, lbbb, no sgarbossa ____________________________________________  RADIOLOGY  I personally reviewed all radiographic images ordered to evaluate for the above acute complaints and reviewed radiology reports and findings.  These findings were personally discussed with the patient.  Please see medical record for radiology report.    EMERGENCY DEPARTMENT [place] CARDIAC EXAM "Study: Limited Ultrasound of the Heart and Pericardium"  INDICATIONS:Dyspnea Multiple views of the heart and pericardium were obtained in real-time with a multi-frequency probe.  PERFORMED PO:EUMPNT IMAGES ARCHIVED?: Yes LIMITATIONS:  None VIEWS USED: Parasternal long axis and Apical 4 chamber  INTERPRETATION: Cardiac activity present, Pericardial effusioin absent and Decreased contractility   ____________________________________________   PROCEDURES  Procedure(s) performed:  Procedures    Critical Care performed:  no ____________________________________________   INITIAL IMPRESSION / ASSESSMENT AND PLAN / ED COURSE  Pertinent labs & imaging results that were available during my care of the patient were reviewed by me and considered in my medical decision making (see chart for details).  DDX: Asthma, copd, CHF, pna, ptx, malignancy, Pe, anemia   [place] B Mortell is a 83 y.o. who presents to the ED with shortness of breath as described above.  Patient is ill-appearing but afebrile and otherwise hemodynamically stable.  Does have some mild respiratory distress with increased tachypnea crackles in bases concerning for congestive heart failure despite being on dialysis and receiving increased dialysis treatments this week.  Feel this is less consistent with COPD or pneumonia.  Patient denies any chest pain.  Troponin is elevated which is difficult to interpret in the setting of her congestive heart failure and dialysis.  Patient will be given Lasix for volume overload she does still make urine and based on her worsening condition I do believe that she will require admission to the hospital for further evaluation of and management.  Have discussed with the patient and available family all diagnostics and treatments performed thus far and all questions were answered to the best of my ability. The patient demonstrates understanding and agreement with plan.       ____________________________________________   FINAL CLINICAL IMPRESSION(S) / ED DIAGNOSES  Final diagnoses:  SOB (shortness of breath)  Acute on chronic congestive heart failure, unspecified heart failure type ([place])  Elevated troponin I level      NEW MEDICATIONS STARTED DURING THIS VISIT:  New Prescriptions   No medications on file     Note:  This document was prepared using Dragon voice recognition software and may include unintentional dictation errors.    Merlyn Lot, MD 12/10/16 [phone]

## 2016-12-10 NOTE — Progress Notes (Signed)
[facility]  ROUNDING NOTE   Subjective:   Kerry Walsh admitted to [facility] on 12/10/2016 for SOB (shortness of breath) [R06.02] Elevated troponin I level [R74.8] Acute on chronic congestive heart failure, unspecified heart failure type ([place]) [I50.9]  Daughter at bedside  Patient has been having increasing peripheral edema and shortness of breath. Received hemodialysis on Thursday, Friday and Saturday. However woke up last night with shortness of breath and brought to the ED. She was treated with IV furosemide which seems to have helped.   Estimated dry weight outpatient is 61.5kg however left treatment yesterday at 66.2kg.   Objective:  Vital signs in last 24 hours:  Temp:  [97.5 F (36.4 C)-98.3 F (36.8 C)] 97.5 F (36.4 C) (10/21 0922) Pulse Rate:  [61-75] 66 (10/21 0922) Resp:  [15-23] 20 (10/21 0922) BP: (121-149)/(55-74) 128/64 (10/21 0922) SpO2:  [90 %-100 %] 100 % (10/21 0922) Weight:  [68.5 kg (151 lb)-80.4 kg (177 lb 4.8 oz)] 80.4 kg (177 lb 4.8 oz) (10/21 0634)  Weight change:  Filed Weights   12/10/16 0321 12/10/16 0634  Weight: 68.5 kg (151 lb) 80.4 kg (177 lb 4.8 oz)    Intake/Output: No intake/output data recorded.   Intake/Output this shift:  No intake/output data recorded.  Physical Exam: General: NAD, laying in bed  Head: Normocephalic, atraumatic. Moist oral mucosal membranes  Eyes: Anicteric, PERRL  Neck: Supple, trachea midline  Lungs:  Mild crackles bilaterally  Heart: Regular rate and rhythm  Abdomen:  Soft, nontender,   Extremities:  1+  peripheral edema.  Neurologic: Alert to self and place only  Skin: No lesions  Access: Left AVF    Basic Metabolic Panel:  Recent Labs Lab 12/06/16 1000 12/10/16 0327  NA 138 139  K 3.6 3.3*  CL 98* 97*  CO2 27 30  GLUCOSE 165* 95  BUN 31* 19  CREATININE 3.13* 2.65*  CALCIUM 8.4* 8.6*  MG  --  1.8    Liver Function Tests:  Recent Labs Lab 12/06/16 1000  12/10/16 0327  AST 36 28  ALT 23 22  ALKPHOS 118 113  BILITOT 1.2 1.0  PROT 6.1* 6.2*  ALBUMIN 3.3* 3.4*   No results for input(s): LIPASE, AMYLASE in the last 168 hours. No results for input(s): AMMONIA in the last 168 hours.  CBC:  Recent Labs Lab 12/06/16 1000 12/10/16 0327  WBC 5.1 5.7  NEUTROABS 3.5 4.4  HGB 12.2 12.4  HCT 36.7 37.7  MCV 97.6 98.7  PLT 102* 99*    Cardiac Enzymes:  Recent Labs Lab 12/10/16 0327 12/10/16 0720  TROPONINI 0.11* 0.12*    BNP: Invalid input(s): POCBNP  CBG: No results for input(s): GLUCAP in the last 168 hours.  Microbiology: Results for orders placed or performed during the hospital encounter of 12/10/16  MRSA PCR Screening     Status: None   Collection Time: 12/10/16  6:34 AM  Result Value Ref Range Status   MRSA by PCR NEGATIVE NEGATIVE Final    Comment:        The GeneXpert MRSA Assay (FDA approved for NASAL specimens only), is one component of a comprehensive MRSA colonization surveillance program. It is not intended to diagnose MRSA infection nor to guide or monitor treatment for MRSA infections.     Coagulation Studies:  Recent Labs  12/10/16 0327  LABPROT 21.7*  INR 1.91    Urinalysis: No results for input(s): COLORURINE, LABSPEC, [place], GLUCOSEU, HGBUR, BILIRUBINUR, KETONESUR, PROTEINUR, UROBILINOGEN, NITRITE, LEUKOCYTESUR  in the last 72 hours.  Invalid input(s): APPERANCEUR    Imaging: Dg Chest Port 1 View  Result Date: 12/10/2016 CLINICAL DATA:  Acute dyspnea EXAM: PORTABLE CHEST 1 VIEW COMPARISON:  07/16/2015 FINDINGS: Stable cardiomegaly with thoracic aortic atherosclerosis. Central vascular congestion with interstitial edema is noted. Trace bilateral pleural effusions with slight blunting of the costophrenic angles. Left shoulder arthroplasty appears intact. Osteoarthritic native right glenohumeral joint. IMPRESSION: Stable cardiomegaly with aortic atherosclerosis. Mild central vascular  congestion with interstitial edema, increased relative to prior comparison. Electronically Signed   By: Ashley Royalty M.D.   On: 12/10/2016 03:42     Medications:    . amLODipine  5 mg Oral BH-q7a  . calcitRIOL  0.25 mcg Oral Daily  . Colloidal Oatmeal  1 application Apply externally BID  . docusate sodium  100 mg Oral BID  . GELOCAST UNNAS BOOT  1 application Apply externally Q72H  . levothyroxine  88 mcg Oral QAC breakfast  . metoprolol tartrate  50 mg Oral BID  . torsemide  40 mg Oral Daily  . warfarin  3 mg Oral QHS  . Warfarin - Physician Dosing Inpatient   Does not apply q1800   acetaminophen **OR** acetaminophen, ondansetron **OR** ondansetron (ZOFRAN) IV, traMADol  Assessment/ Plan:  Kerry Walsh is a 83 y.o. white female with hypertension, hypothyroidism, atrial fibrillation, hyperlipidemia, dementia, overactive bladder. First dialysis 08/24/15   TTS [facility]  1. End Stage Renal Disease: TTS. patient seems be volume overloaded.  - Will give PO torsemide today.  - Will lengthen treatment time to 3.5 hours tomorrow in order to get better ultrafiltration.   2. Hypertension: blood pressure at goal.  - amlodipine, and metoprolol - restart torsemide as above  3. Anemia of chronic kidney disease: hemoglobin 12.4 - Mircera as outpatient.   4. Secondary Hyperparathyroidism with hyperphosphatemia: phos 6.6, PTH 719 on 10/18 - calcitriol - not currently on binders. Will consider starting a binder.    LOS: 0 Kerry Walsh 10/21/201811:08 AM

## 2016-12-10 NOTE — Clinical Social Work Note (Signed)
Clinical Social Work Assessment  Patient Details  Name: Kerry Walsh MRN: [id number] Date of Birth: October 26, 1925  Date of referral:  12/10/16               Reason for consult:  Facility Placement                Permission sought to share information with:  Facility Sport and exercise psychologist Permission granted to share information::  Yes, Verbal Permission Granted  Name::        Agency::  [facility]  Relationship::     Contact Information:  [phone]  Housing/Transportation Living arrangements for the past 2 months:  [place] of Information:  Patient, Medical Team, Spouse, Adult Children, Facility Patient Interpreter Needed:  None Criminal Activity/Legal Involvement Pertinent to Current Situation/Hospitalization:  No - Comment as needed Significant Relationships:  Adult Children, Spouse, Community Support Lives with:  Facility Resident Do you feel safe going back to the place where you live?  Yes Need for family participation in patient care:  No (Coment)  Care giving concerns:  Patient admitted from [facility]  Social Worker assessment / plan: The CSW met with the patient, her spouse, and her daughter at bedside to discuss discharge planning. The family and patient stated that the patient plans to return to the ALF via family transport when stable. The facility has confirmed the patient's residency and have stated that she can return when stable. Discharge unknown. Patient receives dialysis TTS through [facility] in [place].   CSW will continue to follow for discharge facilitation.  Employment status:  Retired Forensic scientist:  Medicare PT Recommendations:  Not assessed at this time Information / Referral to community resources:     Patient/Family's Response to care:  The patient and family thanked the CSW for assistance.  Patient/Family's Understanding of and Emotional Response to Diagnosis, Current Treatment, and Prognosis:  The  family understands the diagnosis and are following recommendations by the medical team.  Emotional Assessment Appearance:  Appears stated age Attitude/Demeanor/Rapport:  Other (Pleasant and alert) Affect (typically observed):  Appropriate, Pleasant Orientation:  Oriented to Self, Oriented to Place, Oriented to  Time, Oriented to Situation Alcohol / Substance use:  Never Used Psych involvement (Current and /or in the community):  No (Comment)  Discharge Needs  Concerns to be addressed:  Care Coordination, Discharge Planning Concerns Readmission within the last 30 days:  No Current discharge risk:  None Barriers to Discharge:  Continued Medical Work up   [employer], LCSW 12/10/2016, 3:03 PM

## 2016-12-10 NOTE — ED Triage Notes (Signed)
EMS pt to Rm 5 from [facility]. EMS reports pt woke up this am with congestion, cold sx and shortness of breath. EMS reports O2 sats of 94% on room air and dropped to the mid 80's with talking. Pt has noted congested cough and shortness of breath with talking. Pt daughter to room and reports pt has been having excess fluid and had an extra day of dialysis on Friday.

## 2016-12-10 NOTE — Clinical Social Work Note (Signed)
CSW received consult that patient admitted from an ALF [facility]). CSW will assess when able.  Santiago Bumpers, MSW, Latanya Presser [phone]

## 2016-12-11 LAB — RENAL FUNCTION PANEL
ALBUMIN: 3.2 g/dL — AB (ref 3.5–5.0)
ANION GAP: 13 (ref 5–15)
BUN: 38 mg/dL — ABNORMAL HIGH (ref 6–20)
CO2: 26 mmol/L (ref 22–32)
Calcium: 8.5 mg/dL — ABNORMAL LOW (ref 8.9–10.3)
Chloride: 98 mmol/L — ABNORMAL LOW (ref 101–111)
Creatinine, Ser: 3.89 mg/dL — ABNORMAL HIGH (ref 0.44–1.00)
GFR calc Af Amer: 11 mL/min — ABNORMAL LOW (ref >60)
GFR calc non Af Amer: 9 mL/min — ABNORMAL LOW (ref >60)
GLUCOSE: 112 mg/dL — AB (ref 65–99)
PHOSPHORUS: 6.1 mg/dL — AB (ref 2.5–4.6)
POTASSIUM: 4 mmol/L (ref 3.5–5.1)
Sodium: 137 mmol/L (ref 135–145)

## 2016-12-11 LAB — CBC
HEMATOCRIT: 38.4 % (ref 35.0–47.0)
HEMOGLOBIN: 12.7 g/dL (ref 12.0–16.0)
MCH: 32.4 pg (ref 26.0–34.0)
MCHC: 32.9 g/dL (ref 32.0–36.0)
MCV: 98.4 fL (ref 80.0–100.0)
Platelets: 87 10*3/uL — ABNORMAL LOW (ref 150–440)
RBC: 3.91 MIL/uL (ref 3.80–5.20)
RDW: 14.8 % — ABNORMAL HIGH (ref 11.5–14.5)
WBC: 5.8 10*3/uL (ref 3.6–11.0)

## 2016-12-11 LAB — PROTIME-INR
INR: 2.17
Prothrombin Time: 24 seconds — ABNORMAL HIGH (ref 11.4–15.2)

## 2016-12-11 MED ORDER — WARFARIN - PHARMACIST DOSING INPATIENT
Freq: Every day | Status: DC
  Administered 2016-12-11: 1

## 2016-12-11 NOTE — Progress Notes (Signed)
[facility] Encounter Note  Patient: Kerry Walsh / Admit Date: 12/10/2016 / Date of Encounter: 12/11/2016, 8:49 AM   Subjective: Patient has no significant complaints today. Patient sleep well overnight with no evidence of PND and/or orthopnea or worsening heart failure type symptoms. No evidence of myocardial infarction or anginal symptoms at this time  Review of Systems: Positive for: Shortness of breath Negative for: Vision change, hearing change, syncope, dizziness, nausea, vomiting,diarrhea, bloody stool, stomach pain, cough, congestion, diaphoresis, urinary frequency, urinary pain,skin lesions, skin rashes Others previously listed  Objective: Telemetry: Atrial fibrillation Physical Exam: Blood pressure 137/60, pulse 61, temperature 98.1 F (36.7 C), temperature source Oral, resp. rate 18, height 5\' 3"  (1.6 m), weight 81.6 kg (180 lb), SpO2 93 %. Body mass index is 31.89 kg/m. General: Well developed, well nourished, in no acute distress. Head: Normocephalic, atraumatic, sclera non-icteric, no xanthomas, nares are without discharge. Neck: No apparent masses Lungs: Normal respirations with no wheezes, no rhonchi, no rales , few basilar crackles   Heart: Irregular rate and rhythm, normal S1 S2, no murmur, no rub, no gallop, PMI is normal size and placement, carotid upstroke normal without bruit, jugular venous pressure normal Abdomen: Soft, non-tender, non-distended with normoactive bowel sounds. No hepatosplenomegaly. Abdominal aorta is normal size without bruit Extremities: Trace edema, no clubbing, no cyanosis, no ulcers,  Peripheral: 2+ radial, 2+ femoral, 2+ dorsal pedal pulses Neuro: Alert and oriented. Moves all extremities spontaneously. Psych:  Responds to questions appropriately with a normal affect.   Intake/Output Summary (Last 24 hours) at 12/11/16 0849 Last data filed at 12/10/16 1840  Gross per 24 hour  Intake              240 ml   Output                0 ml  Net              240 ml    Inpatient Medications:  . amLODipine  5 mg Oral BH-q7a  . calcitRIOL  0.25 mcg Oral Daily  . Colloidal Oatmeal  1 application Apply externally BID  . docusate sodium  100 mg Oral BID  . GELOCAST UNNAS BOOT  1 application Apply externally Q72H  . levothyroxine  88 mcg Oral QAC breakfast  . metoprolol tartrate  50 mg Oral BID  . sodium chloride flush  3 mL Intravenous Q12H  . torsemide  40 mg Oral Daily  . warfarin  3 mg Oral q1800   Infusions:   Labs:  Recent Labs  12/10/16 0327  NA 139  K 3.3*  CL 97*  CO2 30  GLUCOSE 95  BUN 19  CREATININE 2.65*  CALCIUM 8.6*  MG 1.8    Recent Labs  12/10/16 0327  AST 28  ALT 22  ALKPHOS 113  BILITOT 1.0  PROT 6.2*  ALBUMIN 3.4*    Recent Labs  12/10/16 0327  WBC 5.7  NEUTROABS 4.4  HGB 12.4  HCT 37.7  MCV 98.7  PLT 99*    Recent Labs  12/10/16 0327 12/10/16 0720 12/10/16 1106 12/10/16 1511  TROPONINI 0.11* 0.12* 0.11* 0.12*   Invalid input(s): POCBNP No results for input(s): HGBA1C in the last 72 hours.   Weights: Filed Weights   12/10/16 0321 12/10/16 0634 12/11/16 0500  Weight: 68.5 kg (151 lb) 80.4 kg (177 lb 4.8 oz) 81.6 kg (180 lb)     Radiology/Studies:  Dg Chest Port 1 View  Result Date: 12/10/2016  CLINICAL DATA:  Acute dyspnea EXAM: PORTABLE CHEST 1 VIEW COMPARISON:  07/16/2015 FINDINGS: Stable cardiomegaly with thoracic aortic atherosclerosis. Central vascular congestion with interstitial edema is noted. Trace bilateral pleural effusions with slight blunting of the costophrenic angles. Left shoulder arthroplasty appears intact. Osteoarthritic native right glenohumeral joint. IMPRESSION: Stable cardiomegaly with aortic atherosclerosis. Mild central vascular congestion with interstitial edema, increased relative to prior comparison. Electronically Signed   By: Ashley Royalty M.D.   On: 12/10/2016 03:42     Assessment and Recommendation  83  y.o. female with the known chronic nonvalvular atrial fibrillation with bundle branch block severe mitral regurgitation chronic kidney disease stage V essential hypertension makes hyperlipidemia and coronary artery disease with acute on chronic systolic dysfunction heart failure with no worsening ejection fraction and likely due to bundle branch block and atrial fibrillation and also having significant pulmonary edema from needing slightly more dialysis 1. Proceed to dialysis today with longer run as per nephrology to improve a volume status 2. Continue metoprolol for heart rate control of chronic nonvalvular atrial fibrillation X 3. Continue warfarin for further risk reduction in stroke with atrial fibrillation without change today 4. No further intervention from the cardiac standpoint due to no evidence of myocardial infarction at this time  5. K for discharged home with follow-up with adjustments of medication management after dialysis if no further evidence of significant symptoms  Signed, Serafina Royals M.D. FACC

## 2016-12-11 NOTE — Progress Notes (Signed)
[facility]  ROUNDING NOTE   Subjective:   Kerry Walsh admitted to [facility] on 12/10/2016 for SOB (shortness of breath) [R06.02] Elevated troponin I level [R74.8] Acute on chronic congestive heart failure, unspecified heart failure type ([place]) [I50.9]  Granddaughter Daughter at bedside  Patient has been having increasing peripheral edema and shortness of breath. Received hemodialysis on Thursday, Friday and Saturday. However woke up Friday night with shortness of breath and brought to the ED. She was treated with IV furosemide which seems to have helped.   Currently on room air.  States that overall she feels better but not back to baseline  Objective:  Vital signs in last 24 hours:  Temp:  [97.4 F (36.3 C)-98.1 F (36.7 C)] 98.1 F (36.7 C) (10/22 0800) Pulse Rate:  [60-62] 61 (10/22 0800) Resp:  [18-22] 18 (10/22 0800) BP: (111-137)/(51-60) 137/60 (10/22 0800) SpO2:  [93 %-98 %] 93 % (10/22 0800) Weight:  [81.6 kg (180 lb)] 81.6 kg (180 lb) (10/22 0500)  Weight change: 13.2 kg (29 lb) Filed Weights   12/10/16 0321 12/10/16 0634 12/11/16 0500  Weight: 68.5 kg (151 lb) 80.4 kg (177 lb 4.8 oz) 81.6 kg (180 lb)    Intake/Output: I/O last 3 completed shifts: In: 240 [P.O.:240] Out: -    Intake/Output this shift:  Total I/O In: 360 [P.O.:360] Out: -   Physical Exam: General: NAD, laying in bed  Head: Normocephalic, atraumatic. Moist oral mucosal membranes  Eyes: Anicteric,   Neck: Supple, trachea midline  Lungs:  Mild basilarcrackles bilaterally  Heart: No rub  Abdomen:  Soft, nontender,   Extremities:  1+  peripheral edema.  Neurologic: Alert to self and place only  Skin: No lesions  Access: Left AVG    Basic Metabolic Panel:  Recent Labs Lab 12/06/16 1000 12/10/16 0327  NA 138 139  K 3.6 3.3*  CL 98* 97*  CO2 27 30  GLUCOSE 165* 95  BUN 31* 19  CREATININE 3.13* 2.65*  CALCIUM 8.4* 8.6*  MG  --  1.8    Liver Function  Tests:  Recent Labs Lab 12/06/16 1000 12/10/16 0327  AST 36 28  ALT 23 22  ALKPHOS 118 113  BILITOT 1.2 1.0  PROT 6.1* 6.2*  ALBUMIN 3.3* 3.4*   No results for input(s): LIPASE, AMYLASE in the last 168 hours. No results for input(s): AMMONIA in the last 168 hours.  CBC:  Recent Labs Lab 12/06/16 1000 12/10/16 0327  WBC 5.1 5.7  NEUTROABS 3.5 4.4  HGB 12.2 12.4  HCT 36.7 37.7  MCV 97.6 98.7  PLT 102* 99*    Cardiac Enzymes:  Recent Labs Lab 12/10/16 0327 12/10/16 0720 12/10/16 1106 12/10/16 1511  TROPONINI 0.11* 0.12* 0.11* 0.12*    BNP: Invalid input(s): POCBNP  CBG: No results for input(s): GLUCAP in the last 168 hours.  Microbiology: Results for orders placed or performed during the hospital encounter of 12/10/16  Blood culture (routine x 2)     Status: None (Preliminary result)   Collection Time: 12/10/16  4:15 AM  Result Value Ref Range Status   Specimen Description BLOOD RIGHT FOREARM  Final   Special Requests   Final    BOTTLES DRAWN AEROBIC AND ANAEROBIC Blood Culture adequate volume   Culture NO GROWTH 1 DAY  Final   Report Status PENDING  Incomplete  Blood culture (routine x 2)     Status: None (Preliminary result)   Collection Time: 12/10/16  4:22 AM  Result  Value Ref Range Status   Specimen Description BLOOD RIGHT ANTECUBITAL  Final   Special Requests   Final    BOTTLES DRAWN AEROBIC AND ANAEROBIC Blood Culture adequate volume   Culture NO GROWTH 1 DAY  Final   Report Status PENDING  Incomplete  MRSA PCR Screening     Status: None   Collection Time: 12/10/16  6:34 AM  Result Value Ref Range Status   MRSA by PCR NEGATIVE NEGATIVE Final    Comment:        The GeneXpert MRSA Assay (FDA approved for NASAL specimens only), is one component of a comprehensive MRSA colonization surveillance program. It is not intended to diagnose MRSA infection nor to guide or monitor treatment for MRSA infections.     Coagulation  Studies:  Recent Labs  12/10/16 0327 12/11/16 0430  LABPROT 21.7* 24.0*  INR 1.91 2.17    Urinalysis: No results for input(s): COLORURINE, LABSPEC, PHURINE, GLUCOSEU, HGBUR, BILIRUBINUR, KETONESUR, PROTEINUR, UROBILINOGEN, NITRITE, LEUKOCYTESUR in the last 72 hours.  Invalid input(s): APPERANCEUR    Imaging: Dg Chest Port 1 View  Result Date: 12/10/2016 CLINICAL DATA:  Acute dyspnea EXAM: PORTABLE CHEST 1 VIEW COMPARISON:  07/16/2015 FINDINGS: Stable cardiomegaly with thoracic aortic atherosclerosis. Central vascular congestion with interstitial edema is noted. Trace bilateral pleural effusions with slight blunting of the costophrenic angles. Left shoulder arthroplasty appears intact. Osteoarthritic native right glenohumeral joint. IMPRESSION: Stable cardiomegaly with aortic atherosclerosis. Mild central vascular congestion with interstitial edema, increased relative to prior comparison. Electronically Signed   By: Ashley Royalty M.D.   On: 12/10/2016 03:42     Medications:    . amLODipine  5 mg Oral BH-q7a  . calcitRIOL  0.25 mcg Oral Daily  . Colloidal Oatmeal  1 application Apply externally BID  . docusate sodium  100 mg Oral BID  . GELOCAST UNNAS BOOT  1 application Apply externally Q72H  . levothyroxine  88 mcg Oral QAC breakfast  . metoprolol tartrate  50 mg Oral BID  . sodium chloride flush  3 mL Intravenous Q12H  . torsemide  40 mg Oral Daily  . warfarin  3 mg Oral q1800  . Warfarin - Pharmacist Dosing Inpatient   Does not apply q1800   acetaminophen **OR** acetaminophen, ondansetron **OR** ondansetron (ZOFRAN) IV, traMADol  Assessment/ Plan:  Kerry Walsh is a 83 y.o. white female with hypertension, hypothyroidism, atrial fibrillation, hyperlipidemia, dementia, overactive bladder. First dialysis 08/24/15   TTS [facility] [place] L AVF  1. End Stage Renal Disease: TTS. patient seems be volume overloaded.  -Extra Dialysis treatment today for volume  removal -Estimated volume removal 2.5 L as tolerated -Routine dialysis tomorrow based on assessment in the morning  2. Hypertension: blood pressure at goal.  - amlodipine, and metoprolol - restart torsemide as above  3. Anemia of chronic kidney disease: hemoglobin 12.4 - Mircera as outpatient.   4. Secondary Hyperparathyroidism with hyperphosphatemia:   monitor phosphorus during hospital stay   LOS: 1 Naiyah Klostermann 10/22/201812:28 PM

## 2016-12-11 NOTE — Progress Notes (Signed)
HD TX started  

## 2016-12-11 NOTE — Progress Notes (Signed)
Pt back from dialysis and pt noted to have swelling and hematoma to inside of left arm next to fistula site, lower gauze dressing of fistula site saturated but not actively bleeding or draining, Caryl Pina RN at bedside as well, hematoma to left arm soft but size of golf ball, Dialysis nurse Deloris Ping, RN called and made aware that pts left arm was of concern and was asked to look at site.  Deloris Ping, RN agreed.   1930 Pt sent to dialysis via bed to have arm assessed, pt left in stable condition with no s/s of distress or discomfort and no signs of active bleeding. Report given to Delrae Rend, RN and Delrae Rend, RN made aware of pts arm and that pt sent to dialysis.

## 2016-12-11 NOTE — Progress Notes (Signed)
[place] [place] NAME: Kerry Walsh    MR#:  [id number]  DATE OF BIRTH:  [birth year]  SUBJECTIVE:  CHIEF COMPLAINT:   Chief Complaint  Patient presents with  . Shortness of Breath   Better shortness breath and cough. Off O2 [place]. REVIEW OF SYSTEMS:  Review of Systems  Constitutional: Positive for malaise/fatigue. Negative for chills and fever.  HENT: Negative for sore throat.   Eyes: Negative for blurred vision and double vision.  Respiratory: Positive for cough and shortness of breath. Negative for hemoptysis, wheezing and stridor.   Cardiovascular: Negative for chest pain, palpitations, orthopnea and leg swelling.  Gastrointestinal: Negative for abdominal pain, blood in stool, diarrhea, melena, nausea and vomiting.  Genitourinary: Negative for dysuria, flank pain and hematuria.  Musculoskeletal: Negative for back pain and joint pain.  Skin: Negative for rash.  Neurological: Positive for weakness. Negative for dizziness, sensory change, focal weakness, seizures, loss of consciousness and headaches.  Endo/Heme/Allergies: Negative for polydipsia.  Psychiatric/Behavioral: Negative for depression. The patient is not nervous/anxious.     DRUG ALLERGIES:   Allergies  Allergen Reactions  . Penicillins Rash    Has patient had a PCN reaction causing immediate rash, facial/tongue/throat swelling, SOB or lightheadedness with hypotension: Yes Has patient had a PCN reaction causing severe rash involving mucus membranes or skin necrosis: No Has patient had a PCN reaction that required hospitalization No Has patient had a PCN reaction occurring within the last 10 years: No If all of the above answers are "NO", then may proceed with Cephalosporin use.    VITALS:  Blood pressure (!) 144/64, pulse 63, temperature 98 F (36.7 C), temperature source Oral, resp. rate 18, height 5\' 3"  (1.6 m), weight 179 lb 14.3 oz (81.6 kg), SpO2 100  %. PHYSICAL EXAMINATION:  Physical Exam  Constitutional: She is oriented to person, place, and time and well-developed, well-nourished, and in no distress.  HENT:  Head: Normocephalic.  Mouth/Throat: Oropharynx is clear and moist.  Eyes: Pupils are equal, round, and reactive to light. Conjunctivae and EOM are normal. No scleral icterus.  Neck: Normal range of motion. Neck supple. No JVD present. No tracheal deviation present.  Cardiovascular: Normal rate, regular rhythm and normal heart sounds.  Exam reveals no gallop.   No murmur heard. Pulmonary/Chest: Effort normal. No respiratory distress. She has no wheezes. She has rales.  Abdominal: Soft. Bowel sounds are normal. She exhibits no distension. There is no tenderness. There is no rebound.  Musculoskeletal: Normal range of motion. She exhibits no edema or tenderness.  Neurological: She is alert and oriented to person, place, and time. No cranial nerve deficit.  Skin: No rash noted. No erythema.  Psychiatric: Affect normal.   LABORATORY PANEL:  Female CBC  Recent Labs Lab 12/10/16 0327  WBC 5.7  HGB 12.4  HCT 37.7  PLT 99*   ------------------------------------------------------------------------------------------------------------------ Chemistries   Recent Labs Lab 12/10/16 0327  NA 139  K 3.3*  CL 97*  CO2 30  GLUCOSE 95  BUN 19  CREATININE 2.65*  CALCIUM 8.6*  MG 1.8  AST 28  ALT 22  ALKPHOS 113  BILITOT 1.0   RADIOLOGY:  No results found. ASSESSMENT AND PLAN:   This is an 83 year old female admitted for CHF exacerbation.  1. CHF: Acute on chronic; systolic. The patient has severe mitral regurgitation.  She was given Lasix IV, restarted torsemide, continue HD today.  2. ESRD: on HD. 3. Elevated troponin: Likely secondary  to decreased clearance as well as demand ischemia. No ischemic changes on EKG. Follow cardiac biomarkers. Consult cardiology. 4. Essential hypertension: Controlled; restarted torsemide,  continue amlodipine and metoprolol 5. Atrial fibrillation: Rate controlled; continue warfarin 6. Hypothyroidism: TSH is elevated; continue Synthroid  All the records are reviewed and case discussed with Care Management/Social Worker. Management plans discussed with the patient, family and they are in agreement.  CODE STATUS: Full Code  TOTAL TIME TAKING CARE OF THIS PATIENT: 32 minutes.   More than 50% of the time was spent in counseling/coordination of care: YES  POSSIBLE D/C IN 1-2 DAYS, DEPENDING ON CLINICAL CONDITION.   Demetrios Loll M.D on 12/11/2016 at 4:00 PM  Between 7am to 6pm - Pager - [phone]  After 6pm go to [email or web address] - [facility]

## 2016-12-11 NOTE — Care Management Note (Addendum)
Case Management Note  Patient Details  Name: Kerry Walsh MRN: [id number] Date of Birth: 1926/02/13  Subjective/Objective:      Admitted to [facility] with the diagnosis of acute on chronic diastolic heart failure. A resident of [facility], Daughter is Kerry Walsh [phone]). [place] comes to see Kerry Walsh at the facility. Established dialysis patient at [facility]) in [place], Tuesday-Thursday-Friday. Kerry Walsh, dialysis representative updated. History of bladder cancer.               Action/Plan: Noted that Kerry Walsh has [place] in the facility.  Nursing assistant at the bedside, unable to complete assessment at this time.   Expected Discharge Date:                  Expected Discharge Plan:     In-House Referral:     Discharge planning Services     Post Acute Care Choice:    Choice offered to:     DME Arranged:    DME Agency:     HH Arranged:    HH Agency:     Status of Service:     If discussed at [employer], dates discussed:    Additional Comments:  Kerry Ammons, RN MSN CCM Care Management 12/11/2016, 1:00 PM

## 2016-12-11 NOTE — Progress Notes (Signed)
Post Hd   

## 2016-12-11 NOTE — Progress Notes (Signed)
PT Cancellation Note  Patient Details Name: Kerry Walsh MRN: [id number] DOB: December 04, 1925   Cancelled Treatment:    Reason Eval/Treat Not Completed: Patient at procedure or test/unavailable.  Pt currently off floor for dialysis.  Will re-attempt PT evaluation at a later date/time.  Leitha Bleak, PT 12/11/16, 3:46 PM [phone]

## 2016-12-11 NOTE — Progress Notes (Signed)
PRE HD   

## 2016-12-11 NOTE — Progress Notes (Signed)
HD TX ended  

## 2016-12-11 NOTE — Progress Notes (Signed)
ANTICOAGULATION CONSULT NOTE - Initial Consult  Pharmacy Consult for Warfarin Dosing  Indication: atrial fibrillation  Allergies  Allergen Reactions  . Penicillins Rash    Has patient had a PCN reaction causing immediate rash, facial/tongue/throat swelling, SOB or lightheadedness with hypotension: Yes Has patient had a PCN reaction causing severe rash involving mucus membranes or skin necrosis: No Has patient had a PCN reaction that required hospitalization No Has patient had a PCN reaction occurring within the last 10 years: No If all of the above answers are "NO", then may proceed with Cephalosporin use.     Patient Measurements: Height: 5\' 3"  (160 cm) Weight: 180 lb (81.6 kg) IBW/kg (Calculated) : 52.4  Labs:  Recent Labs  12/10/16 0327 12/10/16 0720 12/10/16 1106 12/10/16 1511 12/11/16 0430  HGB 12.4  --   --   --   --   HCT 37.7  --   --   --   --   PLT 99*  --   --   --   --   LABPROT 21.7*  --   --   --  24.0*  INR 1.91  --   --   --  2.17  CREATININE 2.65*  --   --   --   --   TROPONINI 0.11* 0.12* 0.11* 0.12*  --     Estimated Creatinine Clearance: 14.6 mL/min (A) (by C-G formula based on SCr of 2.65 mg/dL (H)).  Assessment: Pharmacy consulted for warfarin dosing and monitoring for a 83 yo female with PMH of A. Fib.  INR on admission slightly subtherapeutic at 1.91  Home Regimen: Warfarin 3mg  Daily   DATE INR DOSE 10/21 1.9 3mg  10/22 2.1   Goal of Therapy:  INR 2-3 Monitor platelets by anticoagulation protocol: Yes   Plan:  INR is therapeutic today with slight increase from yesterday. Will continue home regimen of warfarin 3 mg PO this evening and recheck INR with AM labs tomorrow.  Lenis Noon, PharmD, BCPS Clinical Pharmacist 12/11/2016 9:52 AM

## 2016-12-11 NOTE — Procedures (Signed)
Pt arrived to HDU via hospital transport staff prior to my arrival and was assessed prior to tx by [facility] RN and found to have a LUA AVF, which was prepared per p&p and cannulated w 15 ga needles w/o issue; order includes RTD 3.5 hours on 3251 bath, BFR 400, DFR 800 and attempt to achieve 2.5 kg fluid removal as tolerated by pt; pt initiated at 1315 w/o issue per report; I took over care of this patient at or about 1515 and until the last 15 minutes of tx and then care was taken back by [facility] RN; pt completed her entire tx per report and achieved target of 2.5 kg fluid removal w/o complication, all blood was rinsed back and needles aseptically de-cannulated and pressure dressings applied; hemostasis achieved w/o prolonged bleeding and pt was stable at the end of her tx and when leaving HDU under the care of transport staff; report called to primary RN by Hughie Closs RN

## 2016-12-11 NOTE — Progress Notes (Signed)
T/c from primary RN about hematoma formation and dressing bleeding through; asked nurse to reinforce dressing and then if uncomfortable redressing she can send her down with transport after reinforced and I will apply a new dressing as I am currently caring for two other patients and am unable to come to her.

## 2016-12-11 NOTE — Progress Notes (Signed)
PRE HD assessment 

## 2016-12-11 NOTE — Progress Notes (Signed)
Removed saturated pressure dressing to arterial portion of access and cleaned the access arm and placed new dressing; also replaced venous dressing and retaped both sites; hemostasis achieved w/o issue

## 2016-12-12 LAB — HEPATITIS B CORE ANTIBODY, TOTAL: HEP B C TOTAL AB: NEGATIVE

## 2016-12-12 LAB — PROTIME-INR
INR: 2.55
Prothrombin Time: 27.2 seconds — ABNORMAL HIGH (ref 11.4–15.2)

## 2016-12-12 LAB — HEPATITIS B SURFACE ANTIBODY,QUALITATIVE: Hep B S Ab: REACTIVE

## 2016-12-12 LAB — T4: T4 TOTAL: 6.7 ug/dL (ref 4.5–12.0)

## 2016-12-12 LAB — HEPATITIS B SURFACE ANTIGEN: Hepatitis B Surface Ag: NEGATIVE

## 2016-12-12 MED ORDER — TORSEMIDE 20 MG PO TABS: 40.0000 mg | ORAL_TABLET | Freq: Every day | ORAL | 0 refills | Status: AC

## 2016-12-12 NOTE — NC FL2 (Signed)
[place] LEVEL OF CARE SCREENING TOOL     IDENTIFICATION  Patient Name: Kerry Walsh Birthdate: March 03, 1925 Sex: female Admission Date (Current Location): 12/10/2016  [place] and [place] Number:  Engineering geologist and Address:  [facility], [address] [place], [place] [address]      Provider Number: [id number]  Attending Physician Name and Address:  Demetrios Loll, MD  Relative Name and Phone Number:       Current Level of Care: Hospital Recommended Level of Care: [place] Prior Approval Number:    Date Approved/Denied:   PASRR Number:    Discharge Plan: Domiciliary (Rest home)    Current Diagnoses: Patient Active Problem List   Diagnosis Date Noted  . Acute on chronic diastolic heart failure ([place]) 12/10/2016  . Hyperlipidemia 02/29/2016  . ESRD on dialysis ([place]) 02/29/2016  . 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 MI (mitral incompetence) 12/25/2014  . Chronic systolic heart failure ([place]) 10/01/2014  . Elevated troponin 09/25/2014  . HTN (hypertension) 09/25/2014  . Acute CHF ([place]) 09/25/2014  . Essential (primary) hypertension 09/25/2014  . Dizziness 06/28/2014  . A-fib ([place]) 06/28/2014  . Dizziness and giddiness 06/28/2014  . Osteoporosis, post-menopausal 03/09/2014  . ADH disorder ([place]) 01/23/2014  . Disorder of posterior pituitary ([place]) 01/23/2014  . 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 Infection and inflammatory reaction due to internal prosthetic device, implant, and graft 01/12/2014  . Arteriosclerosis of coronary artery 08/23/2013  . Hypothyroid 08/23/2013    Orientation RESPIRATION BLADDER Height & Weight     Self, Time, Situation, Place  O2 (o2 2 L) Continent Weight: 181 lb 9.6 oz (82.4 kg) Height:  5\' 3"  (160 cm)  BEHAVIORAL SYMPTOMS/MOOD NEUROLOGICAL BOWEL NUTRITION  STATUS      Continent Diet (Renal diet)  AMBULATORY STATUS COMMUNICATION OF NEEDS Skin   Supervision Verbally Normal                       Personal Care Assistance Level of Assistance  Bathing, Feeding, Dressing Bathing Assistance: Limited assistance Feeding assistance: Independent Dressing Assistance: Limited assistance     Functional Limitations Info             SPECIAL CARE FACTORS FREQUENCY                       Contractures Contractures Info: Not present    Additional Factors Info  Code Status, Allergies Code Status Info: Full Allergies Info: Penicillins           Current Medications (12/12/2016):  This is the current hospital active medication list Current Facility-Administered Medications  Medication Dose Route Frequency Provider Last Rate Last Dose  . acetaminophen (TYLENOL) tablet 650 mg  650 mg Oral Q6H PRN Harrie Foreman, MD       Or  . acetaminophen (TYLENOL) suppository 650 mg  650 mg Rectal Q6H PRN Harrie Foreman, MD      . amLODipine (NORVASC) tablet 5 mg  5 mg Oral Edwinna Areola, MD   5 mg at 12/12/16 0556  . calcitRIOL (ROCALTROL) capsule 0.25 mcg  0.25 mcg Oral Daily Harrie Foreman, MD   0.25 mcg at 12/12/16 0915  . docusate sodium (COLACE) capsule 100 mg  100 mg Oral BID Harrie Foreman, MD   100 mg at 12/12/16 0915  .  [place] 1 application  1 application Apply externally Q72H Harrie Foreman, MD   1 application at 00/93/81 [phone]  . levothyroxine (SYNTHROID, LEVOTHROID) tablet 88 mcg  88 mcg Oral QAC breakfast Harrie Foreman, MD   88 mcg at 12/12/16 0556  . metoprolol tartrate (LOPRESSOR) tablet 50 mg  50 mg Oral BID Harrie Foreman, MD   50 mg at 12/12/16 0916  . ondansetron (ZOFRAN) tablet 4 mg  4 mg Oral Q6H PRN Harrie Foreman, MD       Or  . ondansetron [facility]) injection 4 mg  4 mg Intravenous Q6H PRN Harrie Foreman, MD      . sodium chloride flush (NS) 0.9 % injection 3  mL  3 mL Intravenous Q12H Demetrios Loll, MD   3 mL at 12/12/16 0915  . torsemide (DEMADEX) tablet 40 mg  40 mg Oral Daily Kolluru, Sarath, MD   40 mg at 12/12/16 0916  . traMADol (ULTRAM) tablet 50 mg  50 mg Oral Q4H PRN Harrie Foreman, MD   50 mg at 12/11/16 0845  . warfarin (COUMADIN) tablet 3 mg  3 mg Oral q1800 Demetrios Loll, MD   3 mg at 12/11/16 1737  . Warfarin - Pharmacist Dosing Inpatient   Does not apply q1800 Lenis Noon, [facility]   1 each at 12/11/16 1800     Discharge Medications: Please see discharge summary for a list of discharge medications.  TAKE these medications   acetaminophen 325 MG tablet Commonly known as:  TYLENOL Take 650 mg by mouth 4 (four) times daily as needed for mild pain or moderate pain.   amLODipine 5 MG tablet Commonly known as:  NORVASC Take 5 mg by mouth every morning.   calcitRIOL 0.25 MCG capsule Commonly known as:  ROCALTROL Take 0.25 mcg by mouth daily.   EUCERIN ECZEMA RELIEF EX Apply 1 application topically 2 (two) times daily. Apply to feet and legs   GELOCAST UNNAS BOOT Misc Apply 1 application topically every 3 (three) days. Apply to bilateral legs   levothyroxine 88 MCG tablet Commonly known as:  SYNTHROID, LEVOTHROID Take 88 mcg by mouth daily before breakfast.   metoprolol tartrate 50 MG tablet Commonly known as:  LOPRESSOR Take 1 tablet (50 mg total) by mouth 2 (two) times daily. What changed:  when to take this   torsemide 20 MG tablet Commonly known as:  DEMADEX Take 2 tablets (40 mg total) by mouth daily. What changed:  medication strength  how much to take  additional instructions  Another medication with the same name was removed. Continue taking this medication, and follow the directions you see here.   traMADol 50 MG tablet Commonly known as:  ULTRAM Take 50 mg by mouth every 4 (four) hours as needed for moderate pain.   warfarin 3 MG tablet Commonly known as:  COUMADIN Take 3 mg by mouth at  bedtime.     Relevant Imaging Results:  Relevant Lab Results:   Additional Information SS#[id number]  Ross Ludwig, [place]

## 2016-12-12 NOTE — Care Management (Signed)
Patient is from [facility] living.  Admitted with heart failure.  Plans for discharge today and have arranged for home health SN and PT through [place] as this is facility preference and patient does not have a preference

## 2016-12-12 NOTE — Progress Notes (Signed)
[facility]  ROUNDING NOTE   Subjective:   [name] admitted to [facility] on 12/10/2016 for SOB (shortness of breath) [R06.02] Elevated troponin I level [R74.8] Acute on chronic congestive heart failure, unspecified heart failure type ([place]) [I50.9]  Feels better this morning.  2500 cc of fluid was removed with dialysis yesterday Patient tolerated treatment well.  However, she did have infiltration of the access towards the end causing some bleeding from the arterial portion of the access.  It was cleaned and new dressing was placed by dialysis nurse This morning, patient denies any acute shortness of breath Sitting up in the chair  Objective:  Vital signs in last 24 hours:  Temp:  [97.5 F (36.4 C)-98.3 F (36.8 C)] 97.8 F (36.6 C) (10/23 0833) Pulse Rate:  [63-73] 72 (10/23 0833) Resp:  [14-19] 18 (10/23 0833) BP: (98-155)/(53-98) 143/65 (10/23 0833) SpO2:  [90 %-91 %] 90 % (10/23 0833) Weight:  [82.4 kg (181 lb 9.6 oz)] 82.4 kg (181 lb 9.6 oz) (10/23 0422)  Weight change: -0.047 kg (-1.7 oz) Filed Weights   12/11/16 0500 12/11/16 1315 12/12/16 0422  Weight: 81.6 kg (180 lb) 81.6 kg (179 lb 14.3 oz) 82.4 kg (181 lb 9.6 oz)    Intake/Output: I/O last 3 completed shifts: In: 840 [P.O.:840] Out: 2500 [Other:2500]   Intake/Output this shift:  Total I/O In: 123 [P.O.:120; I.V.:3] Out: 0   Physical Exam: General: NAD, sitting up in the chair  Head: Normocephalic, atraumatic. Moist oral mucosal membranes  Eyes: Anicteric,   Neck: Supple, trachea midline  Lungs:   Clear to auscultation  Heart: No rub  Abdomen:  Soft, nontender,   Extremities:  Trace peripheral edema.  Neurologic: Alert to self and place only  Skin: No lesions  Access: Left AVG, good bruit    Basic Metabolic Panel:  Recent Labs Lab 12/06/16 1000 12/10/16 0327 12/11/16 1617  NA 138 139 137  K 3.6 3.3* 4.0  CL 98* 97* 98*  CO2 27 30 26   GLUCOSE 165* 95 112*  BUN 31* 19  38*  CREATININE 3.13* 2.65* 3.89*  CALCIUM 8.4* 8.6* 8.5*  MG  --  1.8  --   PHOS  --   --  6.1*    Liver Function Tests:  Recent Labs Lab 12/06/16 1000 12/10/16 0327 12/11/16 1617  AST 36 28  --   ALT 23 22  --   ALKPHOS 118 113  --   BILITOT 1.2 1.0  --   PROT 6.1* 6.2*  --   ALBUMIN 3.3* 3.4* 3.2*   No results for input(s): LIPASE, AMYLASE in the last 168 hours. No results for input(s): AMMONIA in the last 168 hours.  CBC:  Recent Labs Lab 12/06/16 1000 12/10/16 0327 12/11/16 1617  WBC 5.1 5.7 5.8  NEUTROABS 3.5 4.4  --   HGB 12.2 12.4 12.7  HCT 36.7 37.7 38.4  MCV 97.6 98.7 98.4  PLT 102* 99* 87*    Cardiac Enzymes:  Recent Labs Lab 12/10/16 0327 12/10/16 0720 12/10/16 1106 12/10/16 1511  TROPONINI 0.11* 0.12* 0.11* 0.12*    BNP: Invalid input(s): POCBNP  CBG: No results for input(s): GLUCAP in the last 168 hours.  Microbiology: Results for orders placed or performed during the hospital encounter of 12/10/16  Blood culture (routine x 2)     Status: None (Preliminary result)   Collection Time: 12/10/16  4:15 AM  Result Value Ref Range Status   Specimen Description BLOOD RIGHT FOREARM  Final   Special Requests   Final    BOTTLES DRAWN AEROBIC AND ANAEROBIC Blood Culture adequate volume   Culture NO GROWTH 2 DAYS  Final   Report Status PENDING  Incomplete  Blood culture (routine x 2)     Status: None (Preliminary result)   Collection Time: 12/10/16  4:22 AM  Result Value Ref Range Status   Specimen Description BLOOD RIGHT ANTECUBITAL  Final   Special Requests   Final    BOTTLES DRAWN AEROBIC AND ANAEROBIC Blood Culture adequate volume   Culture NO GROWTH 2 DAYS  Final   Report Status PENDING  Incomplete  MRSA PCR Screening     Status: None   Collection Time: 12/10/16  6:34 AM  Result Value Ref Range Status   MRSA by PCR NEGATIVE NEGATIVE Final    Comment:        The GeneXpert MRSA Assay (FDA approved for NASAL specimens only), is one  component of a comprehensive MRSA colonization surveillance program. It is not intended to diagnose MRSA infection nor to guide or monitor treatment for MRSA infections.     Coagulation Studies:  Recent Labs  12/10/16 0327 12/11/16 0430 12/12/16 0554  LABPROT 21.7* 24.0* 27.2*  INR 1.91 2.17 2.55    Urinalysis: No results for input(s): COLORURINE, LABSPEC, PHURINE, GLUCOSEU, HGBUR, BILIRUBINUR, KETONESUR, PROTEINUR, UROBILINOGEN, NITRITE, LEUKOCYTESUR in the last 72 hours.  Invalid input(s): APPERANCEUR    Imaging: No results found.   Medications:    . amLODipine  5 mg Oral BH-q7a  . calcitRIOL  0.25 mcg Oral Daily  . docusate sodium  100 mg Oral BID  . GELOCAST UNNAS BOOT  1 application Apply externally Q72H  . levothyroxine  88 mcg Oral QAC breakfast  . metoprolol tartrate  50 mg Oral BID  . sodium chloride flush  3 mL Intravenous Q12H  . torsemide  40 mg Oral Daily  . warfarin  3 mg Oral q1800  . Warfarin - Pharmacist Dosing Inpatient   Does not apply q1800   acetaminophen **OR** acetaminophen, ondansetron **OR** ondansetron (ZOFRAN) IV, traMADol  Assessment/ Plan:  [name] with hypertension, hypothyroidism, atrial fibrillation, hyperlipidemia, dementia, overactive bladder. First dialysis 08/24/15   TTS [facility]  1. End Stage Renal Disease: TTS. patient seems be volume overloaded.  -Patient will continue her routine dialysis on Thursday.  We will rest the access today Clinically, she appears well, no shortness of breath.  2500 cc removed yesterday  2. Hypertension: blood pressure at goal.  - amlodipine, and metoprolol -Continue torsemide  3. Anemia of chronic kidney disease: hemoglobin 12.7 - [facility] as outpatient.   4. Secondary Hyperparathyroidism with hyperphosphatemia:   monitor phosphorus during hospital stay   LOS: 2 Hydeia Mcatee 10/23/20182:05 PM

## 2016-12-12 NOTE — Progress Notes (Signed)
Pt. VSS, no complaints at this time, sent back to [facility]  Via wheelchair w/ daughter. Report given to [facility].

## 2016-12-12 NOTE — Discharge Instructions (Signed)
Heart healthy diet. °HHPT °

## 2016-12-12 NOTE — Care Management Important Message (Signed)
Important Message  Patient Details  Name: Kerry Walsh MRN: [id number] Date of Birth: 10-21-1925   Medicare Important Message Given:  N/A - LOS <3 / Initial given by admissions    Katrina Stack, RN 12/12/2016, 11:38 AM

## 2016-12-12 NOTE — Clinical Social Work Note (Signed)
Patient to be d/c'ed today to [facility].  Patient and family agreeable to plans will transport via daughter's car RN to call report [phone].  Evette Cristal, MSW, [place]

## 2016-12-12 NOTE — Plan of Care (Signed)
Problem: Tissue Perfusion: Goal: Risk factors for ineffective tissue perfusion will decrease Outcome: Progressing Left arm fistula site without further complications.  No bleeding noted this shift.

## 2016-12-12 NOTE — Evaluation (Signed)
Physical Therapy Evaluation Patient Details Name: Kerry Walsh MRN: [id number] DOB: [birth year] Today's Date: 12/12/2016   History of Present Illness  83 y/o female  with past medical history of systolic heart failure secondary to severe mitral regurgitation, ESRD on dialysis, atrial fibrillation, hypertension and hypothyroidism presents to the emergency department complaining of shortness of breath  Clinical Impression  Pt with some confusion but was able to do all requested acts with good relative safety.  She showed some impulsiveness and needed extra cuing but overall did well.  Gait training for appropriate walker use, proper posture and attempts at increasing cadence consistency and step length.    Follow Up Recommendations Home health PT    Equipment Recommendations  None recommended by PT    Recommendations for Other Services       Precautions / Restrictions Precautions Precautions: Fall Restrictions Weight Bearing Restrictions: No      Mobility  Bed Mobility Overal bed mobility: Modified Independent             General bed mobility comments: Able to get to sitting w/o issue  Transfers Overall transfer level: Modified independent Equipment used: Rolling walker (2 wheeled)             General transfer comment: Pt able to rise to standing w/o direct assist, did need cues to insure she had her AD there when she was first standing.  Other than verbal cues for safety she did well.  Ambulation/Gait Ambulation/Gait assistance: Min guard Ambulation Distance (Feet): 100 Feet Assistive device: Rolling walker (2 wheeled)       General Gait Details: Pt was able to ambulate relatively well, she is able to take hesitant but safe steps.    Stairs            Wheelchair Mobility    Modified Rankin (Stroke Patients Only)       Balance Overall balance assessment: Modified Independent                                            Pertinent Vitals/Pain Pain Assessment: No/denies pain    Home Living Family/patient expects to be discharged to:: Assisted living               Home Equipment: Walker - 4 wheels      Prior Function Level of Independence: Independent with assistive device(s)         Comments: Pt able to walk down to meals 3x/day, not overly active but functional     Hand Dominance        Extremity/Trunk Assessment   Upper Extremity Assessment Upper Extremity Assessment: Generalized weakness (age appropriate limitations)    Lower Extremity Assessment Lower Extremity Assessment: Generalized weakness (age appropriate limitations)       Communication   Communication: HOH  Cognition Arousal/Alertness: Awake/alert Behavior During Therapy: WFL for tasks assessed/performed Overall Cognitive Status: Impaired/Different from baseline                                 General Comments: Pt with some confusion, delayed responses, daughter reports she is not at baseline      General Comments      Exercises     Assessment/Plan    PT Assessment Patient needs continued PT services  PT Problem List Decreased strength;Decreased range  of motion;Decreased activity tolerance;Decreased balance;Decreased mobility;Decreased cognition;Decreased knowledge of use of DME;Decreased coordination;Decreased safety awareness;Decreased knowledge of precautions;Cardiopulmonary status limiting activity       PT Treatment Interventions DME instruction;Gait training;Stair training;Functional mobility training;Therapeutic exercise;Therapeutic activities;Balance training;Patient/family education;Cognitive remediation    PT Goals (Current goals can be found in the Care Plan section)  Acute Rehab PT Goals Patient Stated Goal: Go back to [facility] PT Goal Formulation: With patient Time For Goal Achievement: 12/26/16 Potential to Achieve Goals: Fair    Frequency Min 2X/week   Barriers  to discharge        Co-evaluation               AM-PAC PT "6 Clicks" Daily Activity  Outcome Measure Difficulty turning over in bed (including adjusting bedclothes, sheets and blankets)?: A Little Difficulty moving from lying on back to sitting on the side of the bed? : A Little Difficulty sitting down on and standing up from a chair with arms (e.g., wheelchair, bedside commode, etc,.)?: A Little Help needed moving to and from a bed to chair (including a wheelchair)?: A Little Help needed walking in hospital room?: A Little Help needed climbing 3-5 steps with a railing? : A Lot 6 Click Score: 17    End of Session Equipment Utilized During Treatment: Gait belt Activity Tolerance: Patient limited by fatigue Patient left: with chair alarm set;with call bell/phone within reach;with family/visitor present   PT Visit Diagnosis: Muscle weakness (generalized) (M62.81);Difficulty in walking, not elsewhere classified (R26.2)    Time: 1884-1660 PT Time Calculation (min) (ACUTE ONLY): 23 min   Charges:   PT Evaluation $PT Eval Low Complexity: 1 Low PT Treatments $Gait Training: 8-22 mins   PT G Codes:   PT G-Codes **NOT FOR INPATIENT CLASS** Functional Assessment Tool Used: AM-PAC 6 Clicks Basic Mobility Functional Limitation: Mobility: Walking and moving around Mobility: Walking and Moving Around Current Status (Y3016): At least 40 percent but less than 60 percent impaired, limited or restricted Mobility: Walking and Moving Around Goal Status [phone]): At least 1 percent but less than 20 percent impaired, limited or restricted    Kreg Shropshire, DPT 12/12/2016, 11:04 AM

## 2016-12-12 NOTE — Discharge Summary (Signed)
[facility] NAME: Kerry Walsh    MR#:  [id number]  DATE OF BIRTH:  December 10, 1925  DATE OF ADMISSION:  12/10/2016   ADMITTING PHYSICIAN: Harrie Foreman, MD  DATE OF DISCHARGE: 12/12/2016 PRIMARY CARE PHYSICIAN: [facility]   ADMISSION DIAGNOSIS:  SOB (shortness of breath) [R06.02] Elevated troponin I level [R74.8] Acute on chronic congestive heart failure, unspecified heart failure type ([place]) [I50.9] DISCHARGE DIAGNOSIS:  Active Problems:   Acute on chronic diastolic heart failure ([place])  SECONDARY DIAGNOSIS:   Past Medical History:  Diagnosis Date  . Anemia   . Anxiety   . Arthritis   . Atrial fibrillation ([place])   . Cancer [facility])    Carcinoid Tumor  . CHF (congestive heart failure) ([place])   . Chronic kidney disease    CKD with last known creatinine fo 2.4 in Dec 2015  . Coronary artery disease   . Dementia   . Depression   . Dialysis patient [facility])    Mon. Wed. Fri.  . Dysrhythmia    Atrial Fibrillation  . Hypertension   . Hypothyroidism   . Malignant poorly differentiated neuroendocrine carcinoma ([place]) 08/12/2014  . Mitral valve regurgitation   . Osteoporosis   . Stroke [facility])    No residual neurological deficits   HOSPITAL COURSE:  This is an 83 year old female admitted for CHF exacerbation.  1. CHF: Acute on chronic; systolic. The patient has severe mitral regurgitation.  She was given LasixIV, restarted torsemide 40 mg po daily, no need HD today per Dr. Candiss Norse. 2. ESRD: on HD. 3. Elevated troponin: Likely secondary to decreased clearance as well as demand ischemia. No ischemic changes on EKG. Follow cardiac biomarkers. Consult cardiology. 4. Essential hypertension: Controlled; restarted torsemide, continue amlodipine and metoprolol 5. Atrial fibrillation: Rate controlled; continue warfarin 6. Hypothyroidism: TSH is elevated; continue Synthroid Weakness: HHPT. Discussed with Dr.  Candiss Norse. DISCHARGE CONDITIONS:  Stable, discharge to ALF with HHPT. CONSULTS OBTAINED:  Treatment Team:  Lavonia Dana, MD Corey Skains, MD DRUG ALLERGIES:   Allergies  Allergen Reactions  . Penicillins Rash    Has patient had a PCN reaction causing immediate rash, facial/tongue/throat swelling, SOB or lightheadedness with hypotension: Yes Has patient had a PCN reaction causing severe rash involving mucus membranes or skin necrosis: No Has patient had a PCN reaction that required hospitalization No Has patient had a PCN reaction occurring within the last 10 years: No If all of the above answers are "NO", then may proceed with Cephalosporin use.    DISCHARGE MEDICATIONS:   Allergies as of 12/12/2016      Reactions   Penicillins Rash   Has patient had a PCN reaction causing immediate rash, facial/tongue/throat swelling, SOB or lightheadedness with hypotension: Yes Has patient had a PCN reaction causing severe rash involving mucus membranes or skin necrosis: No Has patient had a PCN reaction that required hospitalization No Has patient had a PCN reaction occurring within the last 10 years: No If all of the above answers are "NO", then may proceed with Cephalosporin use.      Medication List    TAKE these medications   acetaminophen 325 MG tablet Commonly known as:  TYLENOL Take 650 mg by mouth 4 (four) times daily as needed for mild pain or moderate pain.   amLODipine 5 MG tablet Commonly known as:  NORVASC Take 5 mg by mouth every morning.   calcitRIOL 0.25 MCG capsule Commonly known as:  ROCALTROL Take 0.25 mcg by mouth daily.   EUCERIN ECZEMA RELIEF EX Apply 1 application topically 2 (two) times daily. Apply to feet and legs   GELOCAST UNNAS BOOT Misc Apply 1 application topically every 3 (three) days. Apply to bilateral legs   levothyroxine 88 MCG tablet Commonly known as:  SYNTHROID, LEVOTHROID Take 88 mcg by mouth daily before breakfast.   metoprolol  tartrate 50 MG tablet Commonly known as:  LOPRESSOR Take 1 tablet (50 mg total) by mouth 2 (two) times daily. What changed:  when to take this   torsemide 20 MG tablet Commonly known as:  DEMADEX Take 2 tablets (40 mg total) by mouth daily. What changed:  medication strength  how much to take  additional instructions  Another medication with the same name was removed. Continue taking this medication, and follow the directions you see here.   traMADol 50 MG tablet Commonly known as:  ULTRAM Take 50 mg by mouth every 4 (four) hours as needed for moderate pain.   warfarin 3 MG tablet Commonly known as:  COUMADIN Take 3 mg by mouth at bedtime.        DISCHARGE INSTRUCTIONS:  See AVS.   If you experience worsening of your admission symptoms, develop shortness of breath, life threatening emergency, suicidal or homicidal thoughts you must seek medical attention immediately by calling 911 or calling your MD immediately  if symptoms less severe.  You Must read complete instructions/literature along with all the possible adverse reactions/side effects for all the Medicines you take and that have been prescribed to you. Take any new Medicines after you have completely understood and accpet all the possible adverse reactions/side effects.   Please note  You were cared for by a hospitalist during your hospital stay. If you have any questions about your discharge medications or the care you received while you were in the hospital after you are discharged, you can call the unit and asked to speak with the hospitalist on call if the hospitalist that took care of you is not available. Once you are discharged, your primary care physician will handle any further medical issues. Please note that NO REFILLS for any discharge medications will be authorized once you are discharged, as it is imperative that you return to your primary care physician (or establish a relationship with a primary care  physician if you do not have one) for your aftercare needs so that they can reassess your need for medications and monitor your lab values.    On the day of Discharge:  VITAL SIGNS:  Blood pressure (!) 143/65, pulse 72, temperature 97.8 F (36.6 C), temperature source Oral, resp. rate 18, height 5\' 3"  (1.6 m), weight 181 lb 9.6 oz (82.4 kg), SpO2 90 %. PHYSICAL EXAMINATION:  GENERAL:  83 y.o.-year-old patient lying in the bed with no acute distress.  EYES: Pupils equal, round, reactive to light and accommodation. No scleral icterus. Extraocular muscles intact.  HEENT: Head atraumatic, normocephalic. Oropharynx and nasopharynx clear.  NECK:  Supple, no jugular venous distention. No thyroid enlargement, no tenderness.  LUNGS: Normal breath sounds bilaterally, no wheezing, rales,rhonchi or crepitation. No use of accessory muscles of respiration.  CARDIOVASCULAR: S1, S2 normal. No murmurs, rubs, or gallops.  ABDOMEN: Soft, non-tender, non-distended. Bowel sounds present. No organomegaly or mass.  EXTREMITIES: No pedal edema, cyanosis, or clubbing.  NEUROLOGIC: Cranial nerves II through XII are intact. Muscle strength 4/5 in all extremities. Sensation intact. Gait not checked.  PSYCHIATRIC: The patient is  alert and oriented x 3.  SKIN: No obvious rash, lesion, or ulcer.  DATA REVIEW:   CBC  Recent Labs Lab 12/11/16 1617  WBC 5.8  HGB 12.7  HCT 38.4  PLT 87*    Chemistries   Recent Labs Lab 12/10/16 0327 12/11/16 1617  NA 139 137  K 3.3* 4.0  CL 97* 98*  CO2 30 26  GLUCOSE 95 112*  BUN 19 38*  CREATININE 2.65* 3.89*  CALCIUM 8.6* 8.5*  MG 1.8  --   AST 28  --   ALT 22  --   ALKPHOS 113  --   BILITOT 1.0  --      Microbiology Results  Results for orders placed or performed during the hospital encounter of 12/10/16  Blood culture (routine x 2)     Status: None (Preliminary result)   Collection Time: 12/10/16  4:15 AM  Result Value Ref Range Status   Specimen  Description BLOOD RIGHT FOREARM  Final   Special Requests   Final    BOTTLES DRAWN AEROBIC AND ANAEROBIC Blood Culture adequate volume   Culture NO GROWTH 2 DAYS  Final   Report Status PENDING  Incomplete  Blood culture (routine x 2)     Status: None (Preliminary result)   Collection Time: 12/10/16  4:22 AM  Result Value Ref Range Status   Specimen Description BLOOD RIGHT ANTECUBITAL  Final   Special Requests   Final    BOTTLES DRAWN AEROBIC AND ANAEROBIC Blood Culture adequate volume   Culture NO GROWTH 2 DAYS  Final   Report Status PENDING  Incomplete  MRSA PCR Screening     Status: None   Collection Time: 12/10/16  6:34 AM  Result Value Ref Range Status   MRSA by PCR NEGATIVE NEGATIVE Final    Comment:        The GeneXpert MRSA Assay (FDA approved for NASAL specimens only), is one component of a comprehensive MRSA colonization surveillance program. It is not intended to diagnose MRSA infection nor to guide or monitor treatment for MRSA infections.     RADIOLOGY:  No results found.   Management plans discussed with the patient, her daughter and they are in agreement.  CODE STATUS: Full Code   TOTAL TIME TAKING CARE OF THIS PATIENT: 33 minutes.    Demetrios Loll M.D on 12/12/2016 at 10:23 AM  Between 7am to 6pm - Pager - [phone]  After 6pm go to [email or web address] - Proofreader  [facility]  Office  [phone]  CC: Primary care physician; [facility]   Note: This dictation was prepared with Dragon dictation along with smaller phrase technology. Any transcriptional errors that result from this process are unintentional.

## 2016-12-13 ENCOUNTER — Emergency Department: Payer: Medicare Other

## 2016-12-13 ENCOUNTER — Emergency Department
Admission: EM | Admit: 2016-12-13 | Discharge: 2016-12-13 | Disposition: A | Discharge status: Home / Self Care | Payer: Medicare Other | Source: Home / Self Care | Attending: Emergency Medicine | Admitting: Emergency Medicine

## 2016-12-13 DIAGNOSIS — I132 Hypertensive heart and chronic kidney disease with heart failure and with stage 5 chronic kidney disease, or end stage renal disease: Secondary | ICD-10-CM | POA: Insufficient documentation

## 2016-12-13 DIAGNOSIS — M545 Low back pain, unspecified: Secondary | ICD-10-CM

## 2016-12-13 DIAGNOSIS — Z992 Dependence on renal dialysis: Secondary | ICD-10-CM | POA: Insufficient documentation

## 2016-12-13 DIAGNOSIS — I2510 Atherosclerotic heart disease of native coronary artery without angina pectoris: Secondary | ICD-10-CM

## 2016-12-13 DIAGNOSIS — I509 Heart failure, unspecified: Secondary | ICD-10-CM | POA: Insufficient documentation

## 2016-12-13 DIAGNOSIS — Z859 Personal history of malignant neoplasm, unspecified: Secondary | ICD-10-CM

## 2016-12-13 DIAGNOSIS — Z79899 Other long term (current) drug therapy: Secondary | ICD-10-CM | POA: Insufficient documentation

## 2016-12-13 DIAGNOSIS — Z7901 Long term (current) use of anticoagulants: Secondary | ICD-10-CM | POA: Insufficient documentation

## 2016-12-13 DIAGNOSIS — E039 Hypothyroidism, unspecified: Secondary | ICD-10-CM

## 2016-12-13 DIAGNOSIS — N186 End stage renal disease: Secondary | ICD-10-CM | POA: Insufficient documentation

## 2016-12-13 LAB — BASIC METABOLIC PANEL
Anion gap: 11 (ref 5–15)
BUN: 35 mg/dL — ABNORMAL HIGH (ref 6–20)
CALCIUM: 9 mg/dL (ref 8.9–10.3)
CO2: 29 mmol/L (ref 22–32)
CREATININE: 3.87 mg/dL — AB (ref 0.44–1.00)
Chloride: 97 mmol/L — ABNORMAL LOW (ref 101–111)
GFR calc Af Amer: 11 mL/min — ABNORMAL LOW (ref >60)
GFR calc non Af Amer: 9 mL/min — ABNORMAL LOW (ref >60)
GLUCOSE: 129 mg/dL — AB (ref 65–99)
Potassium: 3.9 mmol/L (ref 3.5–5.1)
Sodium: 137 mmol/L (ref 135–145)

## 2016-12-13 LAB — URINALYSIS, COMPLETE (UACMP) WITH MICROSCOPIC
BILIRUBIN URINE: NEGATIVE
Bacteria, UA: NONE SEEN
Glucose, UA: NEGATIVE mg/dL
Ketones, ur: NEGATIVE mg/dL
Leukocytes, UA: NEGATIVE
Nitrite: NEGATIVE
PROTEIN: 100 mg/dL — AB
SPECIFIC GRAVITY, URINE: 1.013 (ref 1.005–1.030)
pH: 5 (ref 5.0–8.0)

## 2016-12-13 LAB — CBC
HCT: 39.5 % (ref 35.0–47.0)
HEMOGLOBIN: 13.1 g/dL (ref 12.0–16.0)
MCH: 32.8 pg (ref 26.0–34.0)
MCHC: 33.3 g/dL (ref 32.0–36.0)
MCV: 98.7 fL (ref 80.0–100.0)
PLATELETS: 107 10*3/uL — AB (ref 150–440)
RBC: 4 MIL/uL (ref 3.80–5.20)
RDW: 14.9 % — ABNORMAL HIGH (ref 11.5–14.5)
WBC: 7.1 10*3/uL (ref 3.6–11.0)

## 2016-12-13 LAB — TROPONIN I
TROPONIN I: 0.1 ng/mL — AB (ref <0.03)
TROPONIN I: 0.09 ng/mL — AB (ref <0.03)

## 2016-12-13 MED ORDER — MORPHINE SULFATE (PF) 2 MG/ML IV SOLN
2.0000 mg | Freq: Once | INTRAVENOUS | Status: AC
  Administered 2016-12-13: 2 mg via INTRAVENOUS
  Filled 2016-12-13: qty 1

## 2016-12-13 MED ORDER — LIDOCAINE 5 % EX PTCH
1.0000 | MEDICATED_PATCH | CUTANEOUS | Status: DC
  Administered 2016-12-13: 1 via TRANSDERMAL
  Filled 2016-12-13: qty 1

## 2016-12-13 MED ORDER — LIDOCAINE 5 % EX PTCH: 1.0000 | MEDICATED_PATCH | CUTANEOUS | 0 refills | Status: AC

## 2016-12-13 NOTE — ED Provider Notes (Signed)
[facility] Emergency Department Provider Note    First MD Initiated Contact with Patient 12/13/16 0320     (approximate)  I have reviewed the triage vital signs and the nursing notes.  level V caveat: Dementia HISTORY  Chief Complaint Back Pain    HPI Kerry Walsh is a 83 y.o. female with below list of chronic medical conditions presents to the emergency department via EMS [facility] with complaint of low back pain. EMS stated the patient admitted to abdominal pain however she denies any abdominal pain at present and stated that she did not have any earlier.   Past Medical History:  Diagnosis Date  . Anemia   . Anxiety   . Arthritis   . Atrial fibrillation ([place])   . Cancer [facility])    Carcinoid Tumor  . CHF (congestive heart failure) ([place])   . Chronic kidney disease    CKD with last known creatinine fo 2.4 in Dec 2015  . Coronary artery disease   . Dementia   . Depression   . Dialysis patient [facility])    Mon. Wed. Fri.  . Dysrhythmia    Atrial Fibrillation  . Hypertension   . Hypothyroidism   . Malignant poorly differentiated neuroendocrine carcinoma ([place]) 08/12/2014  . Mitral valve regurgitation   . Osteoporosis   . Stroke [facility])    No residual neurological deficits    Patient Active Problem List   Diagnosis Date Noted  . Acute on chronic diastolic heart failure ([place]) 12/10/2016  . Hyperlipidemia 02/29/2016  . ESRD on dialysis ([place]) 02/29/2016  . 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 MI (mitral incompetence) 12/25/2014  . Chronic systolic heart failure ([place]) 10/01/2014  . Elevated troponin 09/25/2014  . HTN (hypertension) 09/25/2014  . Acute CHF ([place]) 09/25/2014  . Essential (primary) hypertension 09/25/2014  . Dizziness 06/28/2014  . A-fib ([place]) 06/28/2014  . Dizziness and giddiness 06/28/2014  . Osteoporosis, post-menopausal 03/09/2014  . ADH disorder ([place]) 01/23/2014  . Disorder of posterior pituitary  ([place]) 01/23/2014  . Syndrome of inappropriate secretion of antidiuretic hormone ([place]) 01/23/2014  . Infected prosthetic knee joint ([place]) 01/12/2014  . Infection and inflammatory reaction due to internal prosthetic device, implant, and graft 01/12/2014  . Arteriosclerosis of coronary artery 08/23/2013  . Hypothyroid 08/23/2013    Past Surgical History:  Procedure Laterality Date  . ABDOMINAL HYSTERECTOMY    . AV FISTULA PLACEMENT Left 01/19/2016   Procedure: INSERTION OF ARTERIOVENOUS (AV) GORE-TEX GRAFT ARM ( BRACH / AXILLARY );  Surgeon: Katha Cabal, MD;  Location: [facility] ORS;  Service: Vascular;  Laterality: Left;  . BACK SURGERY    . CHOLECYSTECTOMY    . EYE SURGERY Bilateral    Cataract Extraction with IOL  . JOINT REPLACEMENT Bilateral    Knee replacement surgery- bilateral  . PERIPHERAL VASCULAR CATHETERIZATION N/A 07/27/2015   Procedure: Dialysis/Perma Catheter Insertion;  Surgeon: Katha Cabal, MD;  Location: [facility] LAB;  Service: Cardiovascular;  Laterality: N/A;  . PERIPHERAL VASCULAR CATHETERIZATION N/A 03/21/2016   Procedure: Dialysis/Perma Catheter Removal;  Surgeon: Katha Cabal, MD;  Location: [facility];  Service: Cardiovascular;  Laterality: N/A;  . SHOULDER SURGERY Bilateral     Prior to Admission medications   Medication Sig Start Date End Date Taking? Authorizing Provider  acetaminophen (TYLENOL) 325 MG tablet Take 650 mg by mouth 4 (four) times daily as needed for mild pain or moderate pain.   Yes [provider]  amLODipine (NORVASC) 5 MG tablet Take 5 mg by mouth every morning.    Yes [provider]  calcitRIOL (ROCALTROL) 0.25 MCG capsule Take 0.25 mcg by mouth daily.   Yes [provider]  Colloidal Oatmeal (EUCERIN ECZEMA RELIEF EX) Apply 1 application topically 2 (two) times daily. Apply to feet and legs   Yes [provider]  levothyroxine (SYNTHROID, LEVOTHROID) 88 MCG tablet Take 88 mcg  by mouth daily before breakfast.  10/23/15  Yes [provider]  metoprolol (LOPRESSOR) 50 MG tablet Take 1 tablet (50 mg total) by mouth 2 (two) times daily. 09/28/14  Yes Wieting, Richard, MD  torsemide (DEMADEX) 20 MG tablet Take 2 tablets (40 mg total) by mouth daily. 12/13/16  Yes Demetrios Loll, MD  traMADol (ULTRAM) 50 MG tablet Take 50 mg by mouth every 4 (four) hours as needed for moderate pain.   Yes [provider]  warfarin (COUMADIN) 3 MG tablet Take 3 mg by mouth at bedtime.   Yes [provider]  Wound Dressings (GELOCAST UNNAS BOOT) MISC Apply 1 application topically every 3 (three) days. Apply to bilateral legs   Yes [provider]    Allergies Penicillins  Family History  Problem Relation Age of Onset  . CAD Father   . Stroke Father   . Bladder Cancer Father   . Kidney failure Mother   . Kidney cancer Neg Hx     Social History Social History  Substance Use Topics  . Smoking status: Never Smoker  . Smokeless tobacco: Never Used  . Alcohol use No    Review of Systems Constitutional: No fever/chills Eyes: No visual changes. ENT: No sore throat. Cardiovascular: Denies chest pain. Respiratory: Denies shortness of breath. Gastrointestinal: No abdominal pain.  No nausea, no vomiting.  No diarrhea.  No constipation. Genitourinary: Negative for dysuria. Musculoskeletal: Negative for neck pain.  positive for back pain. Integumentary: Negative for rash. Neurological: Negative for headaches, focal weakness or numbness.   ____________________________________________   PHYSICAL EXAM:  VITAL SIGNS: ED Triage Vitals  Enc Vitals Group     BP 12/13/16 0326 (!) 153/75     Pulse Rate 12/13/16 0326 75     Resp 12/13/16 0326 18     Temp 12/13/16 0326 97.8 F (36.6 C)     Temp Source 12/13/16 0326 Oral     SpO2 12/13/16 0324 96 %     Weight 12/13/16 0327 82.1 kg (181 lb)     Height 12/13/16 0327 1.626 m (5\' 4" )     Head Circumference  --      Peak Flow --      Pain Score 12/13/16 0618 Asleep     Pain Loc --      Pain Edu? --      Excl. in [place]? --     Constitutional: Alert and oriented. Well appearing and in no acute distress. Eyes: Conjunctivae are normal. Head: Atraumatic. Mouth/Throat: Mucous membranes are moist.  Oropharynx non-erythematous. Neck: No stridor.   Cardiovascular: Normal rate, regular rhythm. Good peripheral circulation. Grossly normal heart sounds. Respiratory: Normal respiratory effort.  No retractions. Lungs CTAB. Gastrointestinal: Soft and nontender. No distention.  Musculoskeletal: No lower extremity tenderness nor edema. No gross deformities of extremities.pain at right lumbar paraspinal muscle palpation Neurologic:  Normal speech and language. No gross focal neurologic deficits are appreciated.  Skin:  Skin is warm, dry and intact. No rash noted. Psychiatric: Mood and affect are normal. Speech and behavior  are normal.  ____________________________________________   LABS (all labs ordered are listed, but only abnormal results are displayed)  Labs Reviewed  BASIC METABOLIC PANEL - Abnormal; Notable for the following:       Result Value   Chloride 97 (*)    Glucose, Bld 129 (*)    BUN 35 (*)    Creatinine, Ser 3.87 (*)    GFR calc non Af Amer 9 (*)    GFR calc Af Amer 11 (*)    All other components within normal limits  CBC - Abnormal; Notable for the following:    RDW 14.9 (*)    Platelets 107 (*)    All other components within normal limits  TROPONIN I - Abnormal; Notable for the following:    Troponin I 0.10 (*)    All other components within normal limits  URINALYSIS, COMPLETE (UACMP) WITH MICROSCOPIC - Abnormal; Notable for the following:    Color, Urine YELLOW (*)    APPearance CLEAR (*)    Hgb urine dipstick SMALL (*)    Protein, ur 100 (*)    Squamous Epithelial / LPF 0-5 (*)    All other components within normal limits  TROPONIN I - Abnormal; Notable for the following:     Troponin I 0.09 (*)    All other components within normal limits     RADIOLOGY I, Kapowsin N Javyon Fontan, personally viewed and evaluated these images (plain radiographs) as part of my medical decision making, as well as reviewing the written report by the radiologist.  Dg Thoracic Spine 2 View  Result Date: 12/13/2016 CLINICAL DATA:  Acute onset of upper back pain.  Initial encounter. EXAM: THORACIC SPINE 2 VIEWS COMPARISON:  Chest radiograph performed 07/16/2015 FINDINGS: There is no evidence of fracture or subluxation. Vertebral bodies demonstrate normal height and alignment. Intervertebral disc spaces are preserved. The visualized portions of both lungs are clear. The mediastinum is unremarkable in appearance. Scattered calcification is seen along the thoracic and proximal abdominal aorta. IMPRESSION: 1. No evidence of fracture or subluxation along the thoracic spine. 2. Scattered aortic atherosclerosis. Electronically Signed   By: Garald Balding M.D.   On: 12/13/2016 05:34   Dg Lumbar Spine Complete  Result Date: 12/13/2016 CLINICAL DATA:  Acute onset of right flank pain.  Initial encounter. EXAM: LUMBAR SPINE - COMPLETE 4+ VIEW COMPARISON:  CT of the abdomen and pelvis from 09/14/2016 FINDINGS: There is no evidence of fracture or subluxation. The patient is status post lumbar spinal fusion at L3-S1. Vertebral bodies demonstrate normal height and alignment. Intervertebral disc spaces are grossly preserved. The visualized bowel gas pattern is unremarkable in appearance; air and stool are noted within the colon. The sacroiliac joints are within normal limits. Scattered calcification is seen along the abdominal aorta. IMPRESSION: 1. No evidence of fracture or subluxation along the lumbar spine. Status post lumbosacral spinal fusion at L3-S1. 2. Scattered aortic atherosclerosis. Electronically Signed   By: Garald Balding M.D.   On: 12/13/2016 05:33      Procedures   ____________________________________________   INITIAL IMPRESSION / ASSESSMENT AND PLAN / ED COURSE  As part of my medical decision making, I reviewed the following data within the electronic MEDICAL RECORD NUMBER 83 year old female presenting with above stated history of physical exam of low back pain.suspect musculoskeletal etiology for the patient's discomfort. Considered possibility of potential aortic disease reviewed CT scan from 09/14/2016 which showed a normal caliber aorta patient is normotensive at this time. Consider possibility of pyelonephritisand a  such urinalysis obtained which revealed no evidence of urinary tract infection. Lidoderm patch applied to the patient's lower back we'll recommend outpatient follow-up     ____________________________________________  FINAL CLINICAL IMPRESSION(S) / ED DIAGNOSES  Final diagnoses:  Acute right-sided low back pain without sciatica     MEDICATIONS GIVEN DURING THIS VISIT:  Medications  morphine 2 MG/ML injection 2 mg (2 mg Intravenous Given 12/13/16 0414)     NEW OUTPATIENT MEDICATIONS STARTED DURING THIS VISIT:  New Prescriptions   No medications on file    Modified Medications   No medications on file    Discontinued Medications   No medications on file     Note:  This document was prepared using Dragon voice recognition software and may include unintentional dictation errors.    Gregor Hams, MD 12/13/16 [phone]

## 2016-12-13 NOTE — ED Notes (Signed)
Date and time results received: 12/13/16 @0506am  Test: Troponin Critical Value: 0.10 ng/mL  Name of Provider Notified: Dr Owens Shark  Orders Received? Or Actions Taken?: No new orders; continue to monitor

## 2016-12-13 NOTE — ED Triage Notes (Addendum)
Pt arrives to ED via [facility] from [facility] with c/o abdominal/back pain. EMS states pt was d/c'd from this facility 6-8 hours ago. Pt denies N/V/D, no SHOB; last BM was "yesterday". Dr Owens Shark at bedside upon pt's arrival; during EDP assessment, pt denies any c/o abdominal pain or tenderness; reports only back pain, localized above the RIGHT flank area during palpitation.

## 2016-12-13 NOTE — ED Notes (Signed)
Patient does not appear to be in any acute distress at time of discharge. Patient to lobby in wheelchair  Patient/family member denies any comments or concerns regarding discharge.

## 2016-12-14 ENCOUNTER — Encounter: Payer: Self-pay | Admitting: Emergency Medicine

## 2016-12-14 ENCOUNTER — Other Ambulatory Visit: Payer: Self-pay

## 2016-12-14 ENCOUNTER — Inpatient Hospital Stay
Admission: EM | Admit: 2016-12-14 | Discharge: 2016-12-18 | DRG: 291 | Disposition: A | Discharge status: Skilled Nursing Facility | Payer: Medicare Other | Attending: Internal Medicine | Admitting: Internal Medicine

## 2016-12-14 ENCOUNTER — Emergency Department: Payer: Medicare Other

## 2016-12-14 DIAGNOSIS — J9601 Acute respiratory failure with hypoxia: Secondary | ICD-10-CM | POA: Diagnosis present

## 2016-12-14 DIAGNOSIS — N3281 Overactive bladder: Secondary | ICD-10-CM | POA: Diagnosis present

## 2016-12-14 DIAGNOSIS — F419 Anxiety disorder, unspecified: Secondary | ICD-10-CM | POA: Diagnosis present

## 2016-12-14 DIAGNOSIS — N186 End stage renal disease: Secondary | ICD-10-CM | POA: Diagnosis present

## 2016-12-14 DIAGNOSIS — Z79899 Other long term (current) drug therapy: Secondary | ICD-10-CM | POA: Diagnosis not present

## 2016-12-14 DIAGNOSIS — M810 Age-related osteoporosis without current pathological fracture: Secondary | ICD-10-CM | POA: Diagnosis present

## 2016-12-14 DIAGNOSIS — F329 Major depressive disorder, single episode, unspecified: Secondary | ICD-10-CM | POA: Diagnosis present

## 2016-12-14 DIAGNOSIS — I340 Nonrheumatic mitral (valve) insufficiency: Secondary | ICD-10-CM | POA: Diagnosis present

## 2016-12-14 DIAGNOSIS — Z992 Dependence on renal dialysis: Secondary | ICD-10-CM | POA: Diagnosis not present

## 2016-12-14 DIAGNOSIS — E785 Hyperlipidemia, unspecified: Secondary | ICD-10-CM | POA: Diagnosis present

## 2016-12-14 DIAGNOSIS — I4891 Unspecified atrial fibrillation: Secondary | ICD-10-CM | POA: Diagnosis present

## 2016-12-14 DIAGNOSIS — Z8673 Personal history of transient ischemic attack (TIA), and cerebral infarction without residual deficits: Secondary | ICD-10-CM

## 2016-12-14 DIAGNOSIS — J811 Chronic pulmonary edema: Secondary | ICD-10-CM | POA: Diagnosis present

## 2016-12-14 DIAGNOSIS — R0600 Dyspnea, unspecified: Secondary | ICD-10-CM | POA: Diagnosis present

## 2016-12-14 DIAGNOSIS — N2581 Secondary hyperparathyroidism of renal origin: Secondary | ICD-10-CM | POA: Diagnosis present

## 2016-12-14 DIAGNOSIS — D631 Anemia in chronic kidney disease: Secondary | ICD-10-CM | POA: Diagnosis present

## 2016-12-14 DIAGNOSIS — Z96653 Presence of artificial knee joint, bilateral: Secondary | ICD-10-CM | POA: Diagnosis present

## 2016-12-14 DIAGNOSIS — E039 Hypothyroidism, unspecified: Secondary | ICD-10-CM | POA: Diagnosis present

## 2016-12-14 DIAGNOSIS — Z880 Allergy status to penicillin: Secondary | ICD-10-CM

## 2016-12-14 DIAGNOSIS — R0902 Hypoxemia: Secondary | ICD-10-CM

## 2016-12-14 DIAGNOSIS — I2510 Atherosclerotic heart disease of native coronary artery without angina pectoris: Secondary | ICD-10-CM | POA: Diagnosis present

## 2016-12-14 DIAGNOSIS — R451 Restlessness and agitation: Secondary | ICD-10-CM | POA: Diagnosis not present

## 2016-12-14 DIAGNOSIS — F0390 Unspecified dementia without behavioral disturbance: Secondary | ICD-10-CM | POA: Diagnosis present

## 2016-12-14 DIAGNOSIS — I132 Hypertensive heart and chronic kidney disease with heart failure and with stage 5 chronic kidney disease, or end stage renal disease: Secondary | ICD-10-CM | POA: Diagnosis present

## 2016-12-14 DIAGNOSIS — I5043 Acute on chronic combined systolic (congestive) and diastolic (congestive) heart failure: Secondary | ICD-10-CM | POA: Diagnosis present

## 2016-12-14 DIAGNOSIS — Z7901 Long term (current) use of anticoagulants: Secondary | ICD-10-CM | POA: Diagnosis not present

## 2016-12-14 LAB — TROPONIN I
TROPONIN I: 0.1 ng/mL — AB (ref <0.03)
Troponin I: 0.11 ng/mL (ref <0.03)
Troponin I: 0.09 ng/mL (ref <0.03)

## 2016-12-14 LAB — CBC WITH DIFFERENTIAL/PLATELET
BASOS ABS: 0.1 10*3/uL (ref 0–0.1)
Basophils Relative: 1 %
Eosinophils Absolute: 0 10*3/uL (ref 0–0.7)
Eosinophils Relative: 1 %
HCT: 39.1 % (ref 35.0–47.0)
HEMOGLOBIN: 12.8 g/dL (ref 12.0–16.0)
LYMPHS PCT: 9 %
Lymphs Abs: 0.6 10*3/uL — ABNORMAL LOW (ref 1.0–3.6)
MCH: 32.1 pg (ref 26.0–34.0)
MCHC: 32.8 g/dL (ref 32.0–36.0)
MCV: 97.9 fL (ref 80.0–100.0)
MONO ABS: 0.5 10*3/uL (ref 0.2–0.9)
Monocytes Relative: 8 %
NEUTROS PCT: 81 %
Neutro Abs: 5.7 10*3/uL (ref 1.4–6.5)
Platelets: 122 10*3/uL — ABNORMAL LOW (ref 150–440)
RBC: 4 MIL/uL (ref 3.80–5.20)
RDW: 14.8 % — AB (ref 11.5–14.5)
WBC: 6.9 10*3/uL (ref 3.6–11.0)

## 2016-12-14 LAB — COMPREHENSIVE METABOLIC PANEL WITH GFR
ALT: 34 U/L (ref 14–54)
AST: 46 U/L — ABNORMAL HIGH (ref 15–41)
Albumin: 3.2 g/dL — ABNORMAL LOW (ref 3.5–5.0)
Alkaline Phosphatase: 142 U/L — ABNORMAL HIGH (ref 38–126)
Anion gap: 17 — ABNORMAL HIGH (ref 5–15)
BUN: 44 mg/dL — ABNORMAL HIGH (ref 6–20)
CO2: 22 mmol/L (ref 22–32)
Calcium: 8.7 mg/dL — ABNORMAL LOW (ref 8.9–10.3)
Chloride: 96 mmol/L — ABNORMAL LOW (ref 101–111)
Creatinine, Ser: 4.54 mg/dL — ABNORMAL HIGH (ref 0.44–1.00)
GFR calc Af Amer: 9 mL/min — ABNORMAL LOW
GFR calc non Af Amer: 8 mL/min — ABNORMAL LOW
Glucose, Bld: 117 mg/dL — ABNORMAL HIGH (ref 65–99)
Potassium: 4.2 mmol/L (ref 3.5–5.1)
Sodium: 135 mmol/L (ref 135–145)
Total Bilirubin: 1 mg/dL (ref 0.3–1.2)
Total Protein: 6.2 g/dL — ABNORMAL LOW (ref 6.5–8.1)

## 2016-12-14 LAB — PROTIME-INR
INR: 2.93
Prothrombin Time: 30.3 s — ABNORMAL HIGH (ref 11.4–15.2)

## 2016-12-14 LAB — TSH: TSH: 5.106 u[IU]/mL — ABNORMAL HIGH (ref 0.350–4.500)

## 2016-12-14 LAB — BRAIN NATRIURETIC PEPTIDE: B Natriuretic Peptide: >4500 pg/mL — ABNORMAL HIGH (ref 0.0–100.0)

## 2016-12-14 MED ORDER — WARFARIN - PHARMACIST DOSING INPATIENT: Freq: Every day | Status: DC

## 2016-12-14 MED ORDER — GUAIFENESIN-DM 100-10 MG/5ML PO SYRP
5.0000 mL | ORAL_SOLUTION | ORAL | Status: DC | PRN
  Administered 2016-12-14: 5 mL via ORAL
  Filled 2016-12-14: qty 5

## 2016-12-14 MED ORDER — ACETAMINOPHEN 325 MG PO TABS: 650.0000 mg | ORAL_TABLET | Freq: Four times a day (QID) | ORAL | Status: DC | PRN

## 2016-12-14 MED ORDER — CALCITRIOL 0.25 MCG PO CAPS
0.2500 ug | ORAL_CAPSULE | Freq: Every day | ORAL | Status: DC
  Administered 2016-12-15 – 2016-12-18 (×3): 0.25 ug via ORAL
  Filled 2016-12-14 (×4): qty 1

## 2016-12-14 MED ORDER — METOPROLOL TARTRATE 50 MG PO TABS
50.0000 mg | ORAL_TABLET | Freq: Two times a day (BID) | ORAL | Status: DC
  Administered 2016-12-14 – 2016-12-18 (×7): 50 mg via ORAL
  Filled 2016-12-14 (×7): qty 1

## 2016-12-14 MED ORDER — SODIUM CHLORIDE 0.9% FLUSH: 3.0000 mL | INTRAVENOUS | Status: DC | PRN

## 2016-12-14 MED ORDER — ONDANSETRON HCL 4 MG PO TABS: 4.0000 mg | ORAL_TABLET | Freq: Four times a day (QID) | ORAL | Status: DC | PRN

## 2016-12-14 MED ORDER — TORSEMIDE 20 MG PO TABS
40.0000 mg | ORAL_TABLET | Freq: Every day | ORAL | Status: DC
  Administered 2016-12-15 – 2016-12-18 (×3): 40 mg via ORAL
  Filled 2016-12-14 (×4): qty 2

## 2016-12-14 MED ORDER — SODIUM CHLORIDE 0.9% FLUSH
3.0000 mL | Freq: Two times a day (BID) | INTRAVENOUS | Status: DC
  Administered 2016-12-14 – 2016-12-18 (×6): 3 mL via INTRAVENOUS

## 2016-12-14 MED ORDER — SODIUM CHLORIDE 0.9 % IV SOLN: 250.0000 mL | INTRAVENOUS | Status: DC | PRN

## 2016-12-14 MED ORDER — LEVOTHYROXINE SODIUM 88 MCG PO TABS
88.0000 ug | ORAL_TABLET | Freq: Every day | ORAL | Status: DC
  Administered 2016-12-15 – 2016-12-18 (×4): 88 ug via ORAL
  Filled 2016-12-14 (×4): qty 1

## 2016-12-14 MED ORDER — FUROSEMIDE 10 MG/ML IJ SOLN
60.0000 mg | Freq: Once | INTRAMUSCULAR | Status: AC
  Administered 2016-12-14: 60 mg via INTRAVENOUS
  Filled 2016-12-14: qty 8

## 2016-12-14 MED ORDER — ACETAMINOPHEN 325 MG PO TABS
650.0000 mg | ORAL_TABLET | Freq: Four times a day (QID) | ORAL | Status: DC | PRN
  Administered 2016-12-16 – 2016-12-17 (×2): 650 mg via ORAL
  Filled 2016-12-14 (×2): qty 2

## 2016-12-14 MED ORDER — BISACODYL 10 MG RE SUPP: 10.0000 mg | Freq: Every day | RECTAL | Status: DC | PRN

## 2016-12-14 MED ORDER — HYDROCODONE-ACETAMINOPHEN 5-325 MG PO TABS
1.0000 | ORAL_TABLET | ORAL | Status: DC | PRN
  Administered 2016-12-14: 2 via ORAL
  Filled 2016-12-14: qty 2

## 2016-12-14 MED ORDER — WARFARIN SODIUM 2 MG PO TABS
2.0000 mg | ORAL_TABLET | Freq: Once | ORAL | Status: AC
  Administered 2016-12-14: 2 mg via ORAL
  Filled 2016-12-14: qty 1

## 2016-12-14 MED ORDER — GELOCAST UNNAS BOOT EX MISC: 1.0000 "application " | CUTANEOUS | Status: DC

## 2016-12-14 MED ORDER — AMLODIPINE BESYLATE 5 MG PO TABS
5.0000 mg | ORAL_TABLET | ORAL | Status: DC
  Administered 2016-12-15 – 2016-12-18 (×2): 5 mg via ORAL
  Filled 2016-12-14 (×2): qty 1

## 2016-12-14 MED ORDER — TRAMADOL HCL 50 MG PO TABS: 50.0000 mg | ORAL_TABLET | ORAL | Status: DC | PRN

## 2016-12-14 MED ORDER — ACETAMINOPHEN 650 MG RE SUPP: 650.0000 mg | Freq: Four times a day (QID) | RECTAL | Status: DC | PRN

## 2016-12-14 MED ORDER — SENNOSIDES-DOCUSATE SODIUM 8.6-50 MG PO TABS: 1.0000 | ORAL_TABLET | Freq: Every evening | ORAL | Status: DC | PRN

## 2016-12-14 MED ORDER — ONDANSETRON HCL 4 MG/2ML IJ SOLN: 4.0000 mg | Freq: Four times a day (QID) | INTRAMUSCULAR | Status: DC | PRN

## 2016-12-14 NOTE — ED Triage Notes (Signed)
Pt to ed via [facility] from [facility] with c/o increased shortness of breath started yesterday. Ems reports pt recvs dialysis T-TH-Sat and did recve tx on Tuesday. Pt supposed to have dialysis today but did not go. Ems reports room air oxygen 87%, placed on 2-3liters saturation now at 97-98%. Pt a/ox3 on arrival to ed with edp at bedside. Protocols initiated.

## 2016-12-14 NOTE — Progress Notes (Signed)
Patient sent to dialysis on oxygen

## 2016-12-14 NOTE — Procedures (Signed)
Pt arrived to HDU via hospital transport staff at or about 1430 and was assessed prior to tx and found to have a LUA AVG, which was prepared per p&p and cannulated w 15 ga needles w/o issue; order includes RTD 3.5 hours on 3251 bath, BFR 400, DFR 800 and attempt to achieve 2 kg fluid removal as tolerated by pt; pt initiated at 1445 w/o issue; pt completed her entire tx and achieved excess of target of 2.5 kg fluid removal w/o complication, all blood was rinsed back and needles aseptically de-cannulated and pressure dressings applied; hemostasis achieved w slight prolonged bleeding to arterial site and pt was stable at the end of her tx and when leaving HDU under the care of transport staff; report called to primary RN

## 2016-12-14 NOTE — ED Provider Notes (Addendum)
[facility] Emergency Department Provider Note  ____________________________________________   I have reviewed the triage vital signs and the nursing notes.   HISTORY  Chief Complaint Shortness of Breath    HPI Kerry Walsh is a 83 y.o. female a history of ESRD, dementia, masses which is supposed to be Tuesday Thursday Saturday according to family and patient, CHF, CK, who presents today feeling shortness of breath.  Patient was going to dialysis but was too hypoxic to make it there.  According to patient she is not normally on home oxygen.  Patient was last dialyzed on Monday during her last hospital stay.  She denies any chest pain fever or significant leg swelling, nothing makes his symptoms better except for oxygen nothing makes it worse except for moving around.  Prior treatment included oxygen from EMS.  Symptoms began today or yesterday she believes.  Patient at her baseline according to family.  Level 5 chart caveat; no further history available due to patient status.  Past Medical History:  Diagnosis Date  . Anemia   . Anxiety   . Arthritis   . Atrial fibrillation ([place])   . Cancer [facility])    Carcinoid Tumor  . CHF (congestive heart failure) ([place])   . Chronic kidney disease    CKD with last known creatinine fo 2.4 in Dec 2015  . Coronary artery disease   . Dementia   . Depression   . Dialysis patient [facility])    Mon. Wed. Fri.  . Dysrhythmia    Atrial Fibrillation  . Hypertension   . Hypothyroidism   . Malignant poorly differentiated neuroendocrine carcinoma ([place]) 08/12/2014  . Mitral valve regurgitation   . Osteoporosis   . Stroke [facility])    No residual neurological deficits    Patient Active Problem List   Diagnosis Date Noted  . Acute on chronic diastolic heart failure ([place]) 12/10/2016  . Hyperlipidemia 02/29/2016  . ESRD on dialysis ([place]) 02/29/2016  . 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 MI (mitral incompetence)  12/25/2014  . Chronic systolic heart failure ([place]) 10/01/2014  . Elevated troponin 09/25/2014  . HTN (hypertension) 09/25/2014  . Acute CHF ([place]) 09/25/2014  . Essential (primary) hypertension 09/25/2014  . Dizziness 06/28/2014  . A-fib ([place]) 06/28/2014  . Dizziness and giddiness 06/28/2014  . Osteoporosis, post-menopausal 03/09/2014  . ADH disorder ([place]) 01/23/2014  . Disorder of posterior pituitary ([place]) 01/23/2014  . Syndrome of inappropriate secretion of antidiuretic hormone ([place]) 01/23/2014  . Infected prosthetic knee joint ([place]) 01/12/2014  . Infection and inflammatory reaction due to internal prosthetic device, implant, and graft 01/12/2014  . Arteriosclerosis of coronary artery 08/23/2013  . Hypothyroid 08/23/2013    Past Surgical History:  Procedure Laterality Date  . ABDOMINAL HYSTERECTOMY    . AV FISTULA PLACEMENT Left 01/19/2016   Procedure: INSERTION OF ARTERIOVENOUS (AV) GORE-TEX GRAFT ARM ( BRACH / AXILLARY );  Surgeon: Katha Cabal, MD;  Location: [facility] ORS;  Service: Vascular;  Laterality: Left;  . BACK SURGERY    . CHOLECYSTECTOMY    . EYE SURGERY Bilateral    Cataract Extraction with IOL  . JOINT REPLACEMENT Bilateral    Knee replacement surgery- bilateral  . PERIPHERAL VASCULAR CATHETERIZATION N/A 07/27/2015   Procedure: Dialysis/Perma Catheter Insertion;  Surgeon: Katha Cabal, MD;  Location: [facility];  Service: Cardiovascular;  Laterality: N/A;  . PERIPHERAL VASCULAR CATHETERIZATION N/A 03/21/2016   Procedure: Dialysis/Perma Catheter Removal;  Surgeon: Katha Cabal, MD;  Location: [facility];  Service: Cardiovascular;  Laterality: N/A;  . SHOULDER SURGERY Bilateral     Prior to Admission medications   Medication Sig Start Date End Date Taking? Authorizing Provider  amLODipine (NORVASC) 5 MG tablet Take 5 mg by mouth every morning.    Yes [provider]  calcitRIOL (ROCALTROL) 0.25 MCG capsule Take 0.25 mcg by  mouth daily.   Yes [provider]  Colloidal Oatmeal (EUCERIN ECZEMA RELIEF EX) Apply 1 application topically 2 (two) times daily. Apply to feet and legs   Yes [provider]  levothyroxine (SYNTHROID, LEVOTHROID) 88 MCG tablet Take 88 mcg by mouth daily before breakfast.  10/23/15  Yes [provider]  lidocaine (LIDODERM) 5 % Place 1 patch onto the skin daily. Remove & Discard patch within 12 hours or as directed by MD 12/13/16 12/23/16 Yes Gregor Hams, MD  metoprolol (LOPRESSOR) 50 MG tablet Take 1 tablet (50 mg total) by mouth 2 (two) times daily. 09/28/14  Yes Wieting, Richard, MD  torsemide (DEMADEX) 20 MG tablet Take 2 tablets (40 mg total) by mouth daily. 12/13/16  Yes Demetrios Loll, MD  warfarin (COUMADIN) 4 MG tablet Take 4 mg by mouth at bedtime.    Yes [provider]  acetaminophen (TYLENOL) 325 MG tablet Take 650 mg by mouth 4 (four) times daily as needed for mild pain or moderate pain.    [provider]  traMADol (ULTRAM) 50 MG tablet Take 50 mg by mouth every 4 (four) hours as needed for moderate pain.    [provider]  Wound Dressings (GELOCAST UNNAS BOOT) MISC Apply 1 application topically every 3 (three) days. Apply to bilateral legs    [provider]    Allergies Penicillins  Family History  Problem Relation Age of Onset  . CAD Father   . Stroke Father   . Bladder Cancer Father   . Kidney failure Mother   . Kidney cancer Neg Hx     Social History Social History  Substance Use Topics  . Smoking status: Never Smoker  . Smokeless tobacco: Never Used  . Alcohol use No    Review of Systems Constitutional: No fever/chills Eyes: No visual changes. ENT: No sore throat. No stiff neck no neck pain Cardiovascular: Denies chest pain. Respiratory: Positive shortness of breath. Gastrointestinal:   no vomiting.  No diarrhea.  No constipation. Genitourinary: Negative for dysuria. Musculoskeletal: Negative  lower extremity swelling Skin: Negative for rash. Neurological: Negative for severe headaches, focal weakness or numbness.   ____________________________________________   PHYSICAL EXAM:  VITAL SIGNS: ED Triage Vitals  Enc Vitals Group     BP --      Pulse Rate 12/14/16 1042 71     Resp 12/14/16 1042 (!) 21     Temp 12/14/16 1042 97.6 F (36.4 C)     Temp Source 12/14/16 1042 Oral     SpO2 12/14/16 1042 97 %     Weight 12/14/16 1038 181 lb (82.1 kg)     Height --      Head Circumference --      Peak Flow --      Pain Score 12/14/16 1037 0     Pain Loc --      Pain Edu? --      Excl. in [place]? --     Constitutional: Alert and oriented to self and place pleasantly demented.  Eyes: Conjunctivae are normal Head: Atraumatic HEENT: No congestion/rhinnorhea. Mucous membranes are  moist.  Oropharynx non-erythematous Neck:   Nontender with no meningismus, no masses, no stridor Cardiovascular: Normal rate, regular rhythm. Grossly normal heart sounds.  Good peripheral circulation. Respiratory: Diffuse rales appreciated, patient sounds wet Abdominal: Soft and nontender. No distention. No guarding no rebound Back:  There is no focal tenderness or step off.  there is no midline tenderness there are no lesions noted. there is no CVA tenderness Musculoskeletal: No lower extremity tenderness, no upper extremity tenderness. No joint effusions, no DVT signs strong distal pulses minimal symmetric edema Neurologic:  Normal speech and language. No gross focal neurologic deficits are appreciated.  Skin:  Skin is warm, dry and intact. No rash noted. Psychiatric: Mood and affect are normal. Speech and behavior are normal.  ____________________________________________   LABS (all labs ordered are listed, but only abnormal results are displayed)  Labs Reviewed  CBC WITH DIFFERENTIAL/PLATELET - Abnormal; Notable for the following:       Result Value   RDW 14.8 (*)    Platelets 122 (*)    Lymphs  Abs 0.6 (*)    All other components within normal limits  PROTIME-INR - Abnormal; Notable for the following:    Prothrombin Time 30.3 (*)    All other components within normal limits  COMPREHENSIVE METABOLIC PANEL  TROPONIN I  BRAIN NATRIURETIC PEPTIDE    Pertinent labs  results that were available during my care of the patient were reviewed by me and considered in my medical decision making (see chart for details). ____________________________________________  EKG  I personally interpreted any EKGs ordered by me or triage Likely atrial fibrillation rate 81 bpm, LVH and IV CD noted. ____________________________________________  RADIOLOGY  Pertinent labs & imaging results that were available during my care of the patient were reviewed by me and considered in my medical decision making (see chart for details). If possible, patient and/or family made aware of any abnormal findings. ____________________________________________    PROCEDURES  Procedure(s) performed: None  Procedures  Critical Care performed: None  ____________________________________________   INITIAL IMPRESSION / ASSESSMENT AND PLAN / ED COURSE  Pertinent labs & imaging results that were available during my care of the patient were reviewed by me and considered in my medical decision making (see chart for details).  Patient here with shortness of breath, she was scheduled to get dialysis today but was too short of breath to make it.  At this time there is no evidence of pneumonia or acute infection, this is likely fluid overload from chronic dialysis.  Apparently she did have dialysis on Monday instead of Tuesday this week and that was likely too long for her to go.  Discussed with the hospitalist to admit patient.  Potassium is pending. Still makes urine and we are ring her IV diuretics pending dialysis.  Hospitalist has discussed with nephrology   ____________________________________________   FINAL  CLINICAL IMPRESSION(S) / ED DIAGNOSES  Final diagnoses:  Dyspnea, unspecified type      This chart was dictated using voice recognition software.  Despite best efforts to proofread,  errors can occur which can change meaning.      Schuyler Amor, MD 12/14/16 1215    Schuyler Amor, MD 12/14/16 [phone]

## 2016-12-14 NOTE — ED Notes (Signed)
Patient transported to X-ray 

## 2016-12-14 NOTE — Progress Notes (Signed)
[facility]  ROUNDING NOTE   Subjective:   [name] admitted to [facility] on 12/14/2016 for breathing diffculty  Patient is known to our practice from previous admission.  She was just discharged from the hospital on 24th.  She returns today for shortness of breath.  She states that he does not know what happened all of a sudden she got short of breath.  Currently she has crackles in her lungs and has oxygen saturation 100% on 2 L nasal cannula.  She denies any pain at present.  She does have cough which is nonproductive.  Appetite is fair.  Patient states she ate a little. She was seen in the emergency room yesterday for abdominal and back pain.  She was discharged later.   Objective:  Vital signs in last 24 hours:  Temp:  [97.6 F (36.4 C)] 97.6 F (36.4 C) (10/25 1042) Pulse Rate:  [64-71] 64 (10/25 1230) Resp:  [18-22] 22 (10/25 1230) BP: (133-136)/(63-71) 136/63 (10/25 1230) SpO2:  [97 %-100 %] 99 % (10/25 1230) Weight:  [82.1 kg (181 lb)] 82.1 kg (181 lb) (10/25 1038)  Weight change:  Filed Weights   12/14/16 1038  Weight: 82.1 kg (181 lb)    Intake/Output: No intake/output data recorded.   Intake/Output this shift:  No intake/output data recorded.  Physical Exam: General: NAD, laying in the bed in the emergency room  Head: Normocephalic, atraumatic. Moist oral mucosal membranes, [place] oxygen  Eyes: Anicteric,   Neck: Supple, distended neck veins noted  Lungs:   Bilateral diffuse crackles  Heart: No rub, irregular bradycardic  Abdomen:  Soft, nontender,   Extremities:  Trace to 1+ peripheral edema.  Neurologic: Alert to self and place only  Skin: No acute lesions  Access: Left AVG, good bruit    Basic Metabolic Panel:  Recent Labs Lab 12/10/16 0327 12/11/16 1617 12/13/16 0330 12/14/16 1043  NA 139 137 137 135  K 3.3* 4.0 3.9 4.2  CL 97* 98* 97* 96*  CO2 30 26 29 22   GLUCOSE 95 112* 129* 117*  BUN 19 38* 35* 44*  CREATININE 2.65*  3.89* 3.87* 4.54*  CALCIUM 8.6* 8.5* 9.0 8.7*  MG 1.8  --   --   --   PHOS  --  6.1*  --   --     Liver Function Tests:  Recent Labs Lab 12/10/16 0327 12/11/16 1617 12/14/16 1043  AST 28  --  46*  ALT 22  --  34  ALKPHOS 113  --  142*  BILITOT 1.0  --  1.0  PROT 6.2*  --  6.2*  ALBUMIN 3.4* 3.2* 3.2*   No results for input(s): LIPASE, AMYLASE in the last 168 hours. No results for input(s): AMMONIA in the last 168 hours.  CBC:  Recent Labs Lab 12/10/16 0327 12/11/16 1617 12/13/16 0330 12/14/16 1043  WBC 5.7 5.8 7.1 6.9  NEUTROABS 4.4  --   --  5.7  HGB 12.4 12.7 13.1 12.8  HCT 37.7 38.4 39.5 39.1  MCV 98.7 98.4 98.7 97.9  PLT 99* 87* 107* 122*    Cardiac Enzymes:  Recent Labs Lab 12/10/16 0720 12/10/16 1106 12/10/16 1511 12/13/16 0330 12/13/16 0615  TROPONINI 0.12* 0.11* 0.12* 0.10* 0.09*    BNP: Invalid input(s): POCBNP  CBG: No results for input(s): GLUCAP in the last 168 hours.  Microbiology: Results for orders placed or performed during the hospital encounter of 12/10/16  Blood culture (routine x 2)  Status: None (Preliminary result)   Collection Time: 12/10/16  4:15 AM  Result Value Ref Range Status   Specimen Description BLOOD RIGHT FOREARM  Final   Special Requests   Final    BOTTLES DRAWN AEROBIC AND ANAEROBIC Blood Culture adequate volume   Culture NO GROWTH 4 DAYS  Final   Report Status PENDING  Incomplete  Blood culture (routine x 2)     Status: None (Preliminary result)   Collection Time: 12/10/16  4:22 AM  Result Value Ref Range Status   Specimen Description BLOOD RIGHT ANTECUBITAL  Final   Special Requests   Final    BOTTLES DRAWN AEROBIC AND ANAEROBIC Blood Culture adequate volume   Culture NO GROWTH 4 DAYS  Final   Report Status PENDING  Incomplete  MRSA PCR Screening     Status: None   Collection Time: 12/10/16  6:34 AM  Result Value Ref Range Status   MRSA by PCR NEGATIVE NEGATIVE Final    Comment:        The  GeneXpert MRSA Assay (FDA approved for NASAL specimens only), is one component of a comprehensive MRSA colonization surveillance program. It is not intended to diagnose MRSA infection nor to guide or monitor treatment for MRSA infections.     Coagulation Studies:  Recent Labs  12/12/16 0554 12/14/16 1043  LABPROT 27.2* 30.3*  INR 2.55 2.93    Urinalysis:  Recent Labs  12/13/16 0547  COLORURINE YELLOW*  LABSPEC 1.013  PHURINE 5.0  GLUCOSEU NEGATIVE  HGBUR SMALL*  BILIRUBINUR NEGATIVE  KETONESUR NEGATIVE  PROTEINUR 100*  NITRITE NEGATIVE  LEUKOCYTESUR NEGATIVE      Imaging: Dg Chest 2 View  Result Date: 12/14/2016 CLINICAL DATA:  Shortness of breath. EXAM: CHEST  2 VIEW COMPARISON:  12/10/2016 FINDINGS: The cardiac silhouette remains enlarged. Aortic atherosclerotic cysts is noted. The lungs are hypoinflated, slightly more so than on the prior study. There is central pulmonary vascular congestion with diffuse interstitial densities bilaterally which have mildly increased. Small pleural effusions are present. No pneumothorax is seen. A left shoulder arthroplasty is again noted. IMPRESSION: Mild worsening of interstitial edema.  Small pleural effusions. Electronically Signed   By: Logan Bores M.D.   On: 12/14/2016 11:11   Dg Thoracic Spine 2 View  Result Date: 12/13/2016 CLINICAL DATA:  Acute onset of upper back pain.  Initial encounter. EXAM: THORACIC SPINE 2 VIEWS COMPARISON:  Chest radiograph performed 07/16/2015 FINDINGS: There is no evidence of fracture or subluxation. Vertebral bodies demonstrate normal height and alignment. Intervertebral disc spaces are preserved. The visualized portions of both lungs are clear. The mediastinum is unremarkable in appearance. Scattered calcification is seen along the thoracic and proximal abdominal aorta. IMPRESSION: 1. No evidence of fracture or subluxation along the thoracic spine. 2. Scattered aortic atherosclerosis.  Electronically Signed   By: Garald Balding M.D.   On: 12/13/2016 05:34   Dg Lumbar Spine Complete  Result Date: 12/13/2016 CLINICAL DATA:  Acute onset of right flank pain.  Initial encounter. EXAM: LUMBAR SPINE - COMPLETE 4+ VIEW COMPARISON:  CT of the abdomen and pelvis from 09/14/2016 FINDINGS: There is no evidence of fracture or subluxation. The patient is status post lumbar spinal fusion at L3-S1. Vertebral bodies demonstrate normal height and alignment. Intervertebral disc spaces are grossly preserved. The visualized bowel gas pattern is unremarkable in appearance; air and stool are noted within the colon. The sacroiliac joints are within normal limits. Scattered calcification is seen along the abdominal aorta. IMPRESSION: 1. No  evidence of fracture or subluxation along the lumbar spine. Status post lumbosacral spinal fusion at L3-S1. 2. Scattered aortic atherosclerosis. Electronically Signed   By: Garald Balding M.D.   On: 12/13/2016 05:33     Medications:       Assessment/ Plan:  [name] with hypertension, hypothyroidism, atrial fibrillation, hyperlipidemia, dementia, overactive bladder. First dialysis 08/24/15   TTS [facility]  1. End Stage Renal Disease: TTS. patient presents with acute pulmonary edema.  -Urgent dialysis today. orders entered. Nurse notified -Fluid removal as tolerated goal of 1.5-2 kg  2.  Acute pulmonary edema -As above -Continue torsemide  3. Anemia of chronic kidney disease: hemoglobin 12.8 - Mircera as outpatient.  -We will hold Procrit for now  4. Secondary Hyperparathyroidism with hyperphosphatemia:   monitor phosphorus during hospital stay   LOS: 0 Branda Chaudhary 10/25/201812:43 PM

## 2016-12-14 NOTE — Progress Notes (Signed)
Family Meeting Note  Advance Directive:yes  Today a meeting took place with the Patient.and family The following clinical team members were present during this meeting:MD  The following were discussed:Patient's diagnosis: severe MR  CHF ESRD on HD  Patient's progosis: Unable to determine and Goals for treatment: Palliative Care  Additional follow-up to be provided: [facility] consult requested  Time spent during discussion:20 minutes  Kerry Walsh, Kerry Bold, MD

## 2016-12-14 NOTE — Progress Notes (Signed)
ANTICOAGULATION CONSULT NOTE - Initial Consult  Pharmacy Consult for warfarin Indication: atrial fibrillation  Allergies  Allergen Reactions  . Penicillins Rash    Has patient had a PCN reaction causing immediate rash, facial/tongue/throat swelling, SOB or lightheadedness with hypotension: Yes Has patient had a PCN reaction causing severe rash involving mucus membranes or skin necrosis: No Has patient had a PCN reaction that required hospitalization No Has patient had a PCN reaction occurring within the last 10 years: No If all of the above answers are "NO", then may proceed with Cephalosporin use.     Patient Measurements: Weight: 181 lb (82.1 kg)   Vital Signs: Temp: 97.5 F (36.4 C) (10/25 1445) Temp Source: Oral (10/25 1445) BP: 139/60 (10/25 1515) Pulse Rate: 64 (10/25 1515)  Labs:  Recent Labs  12/11/16 1617 12/12/16 0554 12/13/16 0330 12/13/16 0615 12/14/16 1043  HGB 12.7  --  13.1  --  12.8  HCT 38.4  --  39.5  --  39.1  PLT 87*  --  107*  --  122*  LABPROT  --  27.2*  --   --  30.3*  INR  --  2.55  --   --  2.93  CREATININE 3.89*  --  3.87*  --  4.54*  TROPONINI  --   --  0.10* 0.09* 0.11*    Estimated Creatinine Clearance: 8.7 mL/min (A) (by C-G formula based on SCr of 4.54 mg/dL (H)).  Assessment: 83 yo female on warfarin prior to admission for AFib. Pt with ESRD on HD admitted for acute exacerbation of CHF.  INR has been trending up steadily since previous admission earlier this week. Pt had been receiving 3 mg of warfarin during the previous admission. INR now borderline for being high. PTA med list notes dose of warfarin 4 mg daily. Pt off floor at dialysis currently.   Goal of Therapy:  INR 2-3 Monitor platelets by anticoagulation protocol: Yes   Plan:  Will order reduced dose of warfarin 2 mg for tonight. INR in AM (daily). Will need to check CBC at least every three days per policy.   Pharmacy will continue to follow.   Rayna Sexton  L 12/14/2016,3:30 PM

## 2016-12-14 NOTE — ED Notes (Signed)
Nephrology to bedside at this time.

## 2016-12-14 NOTE — H&P (Signed)
[facility] [place] NAME: Kerry Walsh    MR#:  [id number]  DATE OF BIRTH:  1925/12/29  DATE OF ADMISSION:  12/14/2016  PRIMARY CARE PHYSICIAN: [facility]   REQUESTING/REFERRING PHYSICIAN: dr Burlene Arnt  CHIEF COMPLAINT:   SOB HISTORY OF PRESENT ILLNESS:  Kerry Walsh  is a 83 y.o. female with a known history of end-stage renal disease on hemodialysis, chronic combined systolic and diastolic heart failure with ejection fraction 45% and severe mitral regurgitation, essential hypertension who presents to the emergency room due to increasing shortness of breath. Patient was discharged from the hospital a few days ago due to shortness of breath from severe mitral regurgitation and pulmonary edema. She is brought back to the hospital for ongoing symptoms. She denies PND, orthopnea, but has increased LEE. Chest x-ray today shows mild interstitial edema. She was given 60 mg IV of Lasix. Shortness of breath is somewhat improved.  She has dialysis on Tuesday, Thursday and Saturday. She received dialysis on Tuesday. She is supposed to have dialysis today but she did not go today. Her oxygen level was 87% at [facility] when EMS arrived.  PAST MEDICAL HISTORY:   Past Medical History:  Diagnosis Date  . Anemia   . Anxiety   . Arthritis   . Atrial fibrillation ([place])   . [facility])    Carcinoid Tumor  . CHF (congestive heart failure) ([place])   . Chronic kidney disease    CKD with last known creatinine fo 2.4 in Dec 2015  . Coronary artery disease   . Dementia   . Depression   . Dialysis patient [facility])    Mon. Wed. Fri.  . Dysrhythmia    Atrial Fibrillation  . Hypertension   . Hypothyroidism   . Malignant poorly differentiated neuroendocrine carcinoma ([place]) 08/12/2014  . Mitral valve regurgitation   . Osteoporosis   . Stroke ([place])    No residual neurological deficits    PAST SURGICAL HISTORY:   Past Surgical History:   Procedure Laterality Date  . ABDOMINAL HYSTERECTOMY    . AV FISTULA PLACEMENT Left 01/19/2016   Procedure: INSERTION OF ARTERIOVENOUS (AV) GORE-TEX GRAFT ARM ( BRACH / AXILLARY );  Surgeon: Katha Cabal, MD;  Location: [facility] ORS;  Service: Vascular;  Laterality: Left;  . BACK SURGERY    . CHOLECYSTECTOMY    . EYE SURGERY Bilateral    Cataract Extraction with IOL  . JOINT REPLACEMENT Bilateral    Knee replacement surgery- bilateral  . PERIPHERAL VASCULAR CATHETERIZATION N/A 07/27/2015   Procedure: Dialysis/Perma Catheter Insertion;  Surgeon: Katha Cabal, MD;  Location: [place] CV LAB;  Service: Cardiovascular;  Laterality: N/A;  . PERIPHERAL VASCULAR CATHETERIZATION N/A 03/21/2016   Procedure: Dialysis/Perma Catheter Removal;  Surgeon: Katha Cabal, MD;  Location: [place] CV LAB;  Service: Cardiovascular;  Laterality: N/A;  . SHOULDER SURGERY Bilateral     SOCIAL HISTORY:   Social History  Substance Use Topics  . Smoking status: Never Smoker  . Smokeless tobacco: Never Used  . Alcohol use No    FAMILY HISTORY:   Family History  Problem Relation Age of Onset  . CAD Father   . Stroke Father   . Bladder Cancer Father   . Kidney failure Mother   . Kidney cancer Neg Hx     DRUG ALLERGIES:   Allergies  Allergen Reactions  . Penicillins Rash    Has patient had a PCN reaction causing  immediate rash, facial/tongue/throat swelling, SOB or lightheadedness with hypotension: Yes Has patient had a PCN reaction causing severe rash involving mucus membranes or skin necrosis: No Has patient had a PCN reaction that required hospitalization No Has patient had a PCN reaction occurring within the last 10 years: No If all of the above answers are "NO", then may proceed with Cephalosporin use.     REVIEW OF SYSTEMS:   Review of Systems  Constitutional: Negative.  Negative for chills, fever and malaise/fatigue.  HENT: Negative.  Negative for ear discharge, ear  pain, hearing loss, nosebleeds and sore throat.   Eyes: Negative.  Negative for blurred vision and pain.  Respiratory: Positive for shortness of breath. Negative for cough, hemoptysis and wheezing.   Cardiovascular: Positive for leg swelling. Negative for chest pain and palpitations.  Gastrointestinal: Negative.  Negative for abdominal pain, blood in stool, diarrhea, nausea and vomiting.  Genitourinary: Negative.  Negative for dysuria.  Musculoskeletal: Negative.  Negative for back pain.  Skin: Negative.   Neurological: Negative for dizziness, tremors, speech change, focal weakness, seizures and headaches.  Endo/Heme/Allergies: Negative.  Does not bruise/bleed easily.  Psychiatric/Behavioral: Negative.  Negative for depression, hallucinations and suicidal ideas.    MEDICATIONS AT HOME:   Prior to Admission medications   Medication Sig Start Date End Date Taking? Authorizing Provider  amLODipine (NORVASC) 5 MG tablet Take 5 mg by mouth every morning.    Yes [provider]  calcitRIOL (ROCALTROL) 0.25 MCG capsule Take 0.25 mcg by mouth daily.   Yes [provider]  Colloidal Oatmeal (EUCERIN ECZEMA RELIEF EX) Apply 1 application topically 2 (two) times daily. Apply to feet and legs   Yes [provider]  levothyroxine (SYNTHROID, LEVOTHROID) 88 MCG tablet Take 88 mcg by mouth daily before breakfast.  10/23/15  Yes [provider]  lidocaine (LIDODERM) 5 % Place 1 patch onto the skin daily. Remove & Discard patch within 12 hours or as directed by MD 12/13/16 12/23/16 Yes Gregor Hams, MD  metoprolol (LOPRESSOR) 50 MG tablet Take 1 tablet (50 mg total) by mouth 2 (two) times daily. 09/28/14  Yes Wieting, Richard, MD  torsemide (DEMADEX) 20 MG tablet Take 2 tablets (40 mg total) by mouth daily. 12/13/16  Yes Demetrios Loll, MD  warfarin (COUMADIN) 4 MG tablet Take 4 mg by mouth at bedtime.    Yes [provider]  acetaminophen (TYLENOL) 325 MG tablet Take  650 mg by mouth 4 (four) times daily as needed for mild pain or moderate pain.    [provider]  traMADol (ULTRAM) 50 MG tablet Take 50 mg by mouth every 4 (four) hours as needed for moderate pain.    [provider]  Wound Dressings (GELOCAST UNNAS BOOT) MISC Apply 1 application topically every 3 (three) days. Apply to bilateral legs    [provider]      VITAL SIGNS:  Pulse 71, temperature 97.6 F (36.4 C), temperature source Oral, resp. rate (!) 21, weight 82.1 kg (181 lb), SpO2 97 %.  PHYSICAL EXAMINATION:   Physical Exam  Constitutional: She is oriented to person, place, and time and well-developed, well-nourished, and in no distress. No distress.  HENT:  Head: Normocephalic.  Eyes: No scleral icterus.  Neck: Normal range of motion. Neck supple. No JVD present. No tracheal deviation present.  Cardiovascular: Normal rate and regular rhythm.  Exam reveals no gallop and no friction rub.   Murmur heard. Pulmonary/Chest: Effort normal. No respiratory distress. She has  no wheezes. She has rales. She exhibits no tenderness.  Abdominal: Soft. Bowel sounds are normal. She exhibits no distension and no mass. There is no tenderness. There is no rebound and no guarding.  Musculoskeletal: Normal range of motion. She exhibits edema (mild 1+).  Neurological: She is alert and oriented to person, place, and time.  Skin: Skin is warm. No rash noted. No erythema.  Psychiatric: Affect and judgment normal.      LABORATORY PANEL:   CBC  Recent Labs Lab 12/14/16 1043  WBC 6.9  HGB 12.8  HCT 39.1  PLT 122*   ------------------------------------------------------------------------------------------------------------------  Chemistries   Recent Labs Lab 12/10/16 0327  12/13/16 0330  NA 139  < > 137  K 3.3*  < > 3.9  CL 97*  < > 97*  CO2 30  < > 29  GLUCOSE 95  < > 129*  BUN 19  < > 35*  CREATININE 2.65*  < > 3.87*  CALCIUM 8.6*  < > 9.0  MG 1.8  --    --   AST 28  --   --   ALT 22  --   --   ALKPHOS 113  --   --   BILITOT 1.0  --   --   < > = values in this interval not displayed. ------------------------------------------------------------------------------------------------------------------  Cardiac Enzymes  Recent Labs Lab 12/13/16 0615  TROPONINI 0.09*   ------------------------------------------------------------------------------------------------------------------  RADIOLOGY:  Dg Chest 2 View  Result Date: 12/14/2016 CLINICAL DATA:  Shortness of breath. EXAM: CHEST  2 VIEW COMPARISON:  12/10/2016 FINDINGS: The cardiac silhouette remains enlarged. Aortic atherosclerotic cysts is noted. The lungs are hypoinflated, slightly more so than on the prior study. There is central pulmonary vascular congestion with diffuse interstitial densities bilaterally which have mildly increased. Small pleural effusions are present. No pneumothorax is seen. A left shoulder arthroplasty is again noted. IMPRESSION: Mild worsening of interstitial edema.  Small pleural effusions. Electronically Signed   By: Logan Bores M.D.   On: 12/14/2016 11:11   Dg Thoracic Spine 2 View  Result Date: 12/13/2016 CLINICAL DATA:  Acute onset of upper back pain.  Initial encounter. EXAM: THORACIC SPINE 2 VIEWS COMPARISON:  Chest radiograph performed 07/16/2015 FINDINGS: There is no evidence of fracture or subluxation. Vertebral bodies demonstrate normal height and alignment. Intervertebral disc spaces are preserved. The visualized portions of both lungs are clear. The mediastinum is unremarkable in appearance. Scattered calcification is seen along the thoracic and proximal abdominal aorta. IMPRESSION: 1. No evidence of fracture or subluxation along the thoracic spine. 2. Scattered aortic atherosclerosis. Electronically Signed   By: Garald Balding M.D.   On: 12/13/2016 05:34   Dg Lumbar Spine Complete  Result Date: 12/13/2016 CLINICAL DATA:  Acute onset of right  flank pain.  Initial encounter. EXAM: LUMBAR SPINE - COMPLETE 4+ VIEW COMPARISON:  CT of the abdomen and pelvis from 09/14/2016 FINDINGS: There is no evidence of fracture or subluxation. The patient is status post lumbar spinal fusion at L3-S1. Vertebral bodies demonstrate normal height and alignment. Intervertebral disc spaces are grossly preserved. The visualized bowel gas pattern is unremarkable in appearance; air and stool are noted within the colon. The sacroiliac joints are within normal limits. Scattered calcification is seen along the abdominal aorta. IMPRESSION: 1. No evidence of fracture or subluxation along the lumbar spine. Status post lumbosacral spinal fusion at L3-S1. 2. Scattered aortic atherosclerosis. Electronically Signed   By: Garald Balding M.D.   On: 12/13/2016 05:33  EKG:  Atrial fib HR 71   No ST elevation or Q waves in the anterior leads IMPRESSION AND PLAN:   83 year old female with history of combined systolic and diastolic heart failure, severe mitral regurgitation, atrial fibrillation currently on warfarin and end-stage renal disease on hemodialysis who presents with worsening shortness of breath and lower extremity edema.  1. Acute hypoxic respiratory failure in the setting of combined systolic and diastolic congestive heart failure  Wean oxygen as tolerated  2. Combined systolic and diastolic heart failure with ejection fraction of 45% and severe mitral regurgitation by echocardiogram in July Cardiology consultation requested Monitor intake and output with daily weight She will undergo dialysis today Continue torsemide  3. ESRD on hemodialysis: I have spoken with nephrologist on call and she will undergo dialysis today.  4. Atrial fibrillation, chronic: Coumadin as per pharmacy consultation Continue metoprolol for heart rate control  5. Essential hypertension: Continue metoprolol and Norvasc   6. Hypothyroidism: Continue Synthroid  I will consult  palliative care.   All the records are reviewed and case discussed with ED provider. Management plans discussed with the patient and she is in agreement  CODE STATUS: FULL  TOTAL TIME TAKING CARE OF THIS PATIENT: 44 minutes.    Hudsyn Barich M.D on 12/14/2016 at 11:50 AM  Between 7am to 6pm - Pager - [phone]  After 6pm go to [email or web address] - Proofreader  [facility]  Office  [phone]  CC: Primary care physician; [facility]

## 2016-12-15 LAB — TROPONIN I: Troponin I: 0.09 ng/mL (ref <0.03)

## 2016-12-15 LAB — PROTIME-INR
INR: 3.43
Prothrombin Time: 34.3 seconds — ABNORMAL HIGH (ref 11.4–15.2)

## 2016-12-15 LAB — CULTURE, BLOOD (ROUTINE X 2)
CULTURE: NO GROWTH
Culture: NO GROWTH
Special Requests: ADEQUATE
Special Requests: ADEQUATE

## 2016-12-15 MED ORDER — HYDROCODONE-ACETAMINOPHEN 5-325 MG PO TABS
1.0000 | ORAL_TABLET | ORAL | Status: DC | PRN
  Administered 2016-12-17: 2 via ORAL
  Filled 2016-12-15: qty 2

## 2016-12-15 MED ORDER — TRAMADOL HCL 50 MG PO TABS: 50.0000 mg | ORAL_TABLET | Freq: Two times a day (BID) | ORAL | Status: DC | PRN

## 2016-12-15 NOTE — Plan of Care (Signed)
Problem: Education: Goal: Knowledge of [facility] Education information/materials will improve Outcome: Not Progressing Patient has dementia and needs assistance.  Problem: Health Behavior/Discharge Planning: Goal: Ability to manage health-related needs will improve Outcome: Not Progressing Patient needs assistance.   

## 2016-12-15 NOTE — Care Management (Signed)
Amanda Morris HD liaison notified of admission.  

## 2016-12-15 NOTE — Progress Notes (Signed)
Physical Therapy Evaluation Patient Details Name: ANDREY HOOBLER MRN: [id number] DOB: 02/23/1925 Today's Date: 12/15/2016   History of Present Illness  pt admitted for pulmonary edema, reports of SOB and did not go to scheduled dialysis yesterday due to hypoxia. PMH of end stage renal disease on hemodialysis, chronic systolic/diastolic heart failure, severe mitral regurgitation, dementia and essential hypertension.   Clinical Impression  Pt is a pleasant 83 year old female admitted for pulmonary edema. Pt performed supine there-ex, bed mobility/transfers with mod. I, amb with RW with CGA. Pt amb a total of 100 ft, slow but safe and steady amb. Pt on 2L oxygen (not baseline), monitored O2 sats, 90% on room air, dropped to 84% with exertion, >95% when placed back on oxygen.  Pt demonstrates deficits with strength/endurance/amb. Would benefit from further PT services to address above deficits and promote optimal return to home. Recommend transition to [place] upon DC from acute hospitalization.     Follow Up Recommendations Home health PT    Equipment Recommendations  None recommended by PT;Other (comment) (Pt has RW, recommended oxygen )    Recommendations for Other Services       Precautions / Restrictions Precautions Precautions: Fall Restrictions Weight Bearing Restrictions: No      Mobility  Bed Mobility Overal bed mobility: Needs Assistance Bed Mobility: Supine to Sit     Supine to sit: Modified independent (Device/Increase time)     General bed mobility comments: Able to get to seated EOB with no assist, utilizes bed rails  Transfers Overall transfer level: Needs assistance Equipment used: Rolling walker (2 wheeled) Transfers: Sit to/from Stand Sit to Stand: Modified independent (Device/Increase time)         General transfer comment: Pt able to rise to standing with no assistance  Ambulation/Gait Ambulation/Gait assistance: Min guard Ambulation Distance  (Feet): 100 Feet Assistive device: Rolling walker (2 wheeled) Gait Pattern/deviations: Step-through pattern     General Gait Details: Pt amb with alternating gait, takes short but consistent steps, able to maintain pace and upright posture.   Stairs            Wheelchair Mobility    Modified Rankin (Stroke Patients Only)       Balance Overall balance assessment: Needs assistance Sitting-balance support: Feet supported Sitting balance-Leahy Scale: Good Sitting balance - Comments: No assist needed   Standing balance support: Bilateral upper extremity supported Standing balance-Leahy Scale: Good Standing balance comment: Pt able to stand with RW with no assist, CGA for safety                             Pertinent Vitals/Pain Pain Assessment: No/denies pain    Home Living Family/patient expects to be discharged to:: Assisted living Living Arrangements: Spouse/significant other             Home Equipment: Walker - 4 wheels Additional Comments: Pt lives with husband at [facility]    Prior Function Level of Independence: Independent with assistive device(s)         Comments: Pt reports amb with RW for safety, able to walk to cafeteria for her meals, able to perform ADLS without assistance     Hand Dominance        Extremity/Trunk Assessment   Upper Extremity Assessment Upper Extremity Assessment: Generalized weakness (UE MMT grossly 3+/5)    Lower Extremity Assessment Lower Extremity Assessment: Generalized weakness (LE MMT grossly 3+/5)  Communication   Communication: HOH  Cognition Arousal/Alertness: Awake/alert Behavior During Therapy: WFL for tasks assessed/performed Overall Cognitive Status: Within Functional Limits for tasks assessed                                 General Comments: Pt requires instructions to be repeated several times      General Comments      Exercises Other Exercises Other  Exercises: Supine ther-ex, 10x each side, ankle pumps, SLR, hip abd/add. Pt requires demonstration and tactile cues to perform ther-ex with proper technique. No assist needed   Assessment/Plan    PT Assessment Patient needs continued PT services  PT Problem List Decreased strength;Decreased range of motion;Decreased activity tolerance;Decreased balance;Decreased mobility;Decreased coordination;Decreased cognition;Decreased knowledge of use of DME;Cardiopulmonary status limiting activity       PT Treatment Interventions DME instruction;Gait training;Stair training;Functional mobility training;Therapeutic activities;Therapeutic exercise;Patient/family education    PT Goals (Current goals can be found in the Care Plan section)  Acute Rehab PT Goals Patient Stated Goal: to go back home PT Goal Formulation: With patient Time For Goal Achievement: 12/29/16 Potential to Achieve Goals: Good    Frequency Min 2X/week   Barriers to discharge        Co-evaluation               AM-PAC PT "6 Clicks" Daily Activity  Outcome Measure Difficulty turning over in bed (including adjusting bedclothes, sheets and blankets)?: None Difficulty moving from lying on back to sitting on the side of the bed? : None Difficulty sitting down on and standing up from a chair with arms (e.g., wheelchair, bedside commode, etc,.)?: None Help needed moving to and from a bed to chair (including a wheelchair)?: A Little Help needed walking in hospital room?: A Little Help needed climbing 3-5 steps with a railing? : A Lot 6 Click Score: 20    End of Session Equipment Utilized During Treatment: Gait belt;Oxygen (2L ) Activity Tolerance: Patient tolerated treatment well Patient left: in chair;with call bell/phone within reach;with chair alarm set Nurse Communication: Mobility status PT Visit Diagnosis: Other abnormalities of gait and mobility (R26.89);Muscle weakness (generalized) (M62.81)    Time:  7062-3762 PT Time Calculation (min) (ACUTE ONLY): 30 min   Charges:         PT G Codes:   PT G-Codes **NOT FOR INPATIENT CLASS** Functional Assessment Tool Used: AM-PAC 6 Clicks Basic Mobility Functional Limitation: Mobility: Walking and moving around Mobility: Walking and Moving Around Current Status (G3151): At least 20 percent but less than 40 percent impaired, limited or restricted Mobility: Walking and Moving Around Goal Status [phone]): At least 1 percent but less than 20 percent impaired, limited or restricted    Manfred Arch, SPT  Manfred Arch 12/15/2016, 12:14 PM

## 2016-12-15 NOTE — Progress Notes (Signed)
[facility]  ROUNDING NOTE   Subjective:   [name] admitted to [facility] on 12/14/2016 for Dyspnea, unspecified type [R06.00] Patient was dialyzed yesterday.  2.5 L of fluid was removed This afternoon, she feels well.  She is still requiring oxygen by nasal cannula   Objective:  Vital signs in last 24 hours:  Temp:  [97.5 F (36.4 C)-97.9 F (36.6 C)] 97.8 F (36.6 C) (10/26 1248) Pulse Rate:  [62-126] 62 (10/26 1248) Resp:  [12-20] 16 (10/26 1248) BP: (113-151)/(52-101) 113/52 (10/26 1248) SpO2:  [93 %-100 %] 97 % (10/26 1248) Weight:  [62.9 kg (138 lb 11.2 oz)-63.5 kg (140 lb)] 62.9 kg (138 lb 11.2 oz) (10/26 0500)  Weight change:  Filed Weights   12/14/16 1038 12/14/16 1900 12/15/16 0500  Weight: 82.1 kg (181 lb) 63.5 kg (140 lb) 62.9 kg (138 lb 11.2 oz)    Intake/Output: I/O last 3 completed shifts: In: 3 [I.V.:3] Out: 2500 [Other:2500]   Intake/Output this shift:  Total I/O In: 240 [P.O.:240] Out: -   Physical Exam: General: NAD, sitting up in the recliner chair  Head: Normocephalic, atraumatic. Moist oral mucosal membranes, Energy oxygen  Eyes: Anicteric,   Neck: Supple, distended neck veins noted  Lungs:   Mild bilateral diffuse crackles  Heart: No rub, irregular bradycardic  Abdomen:  Soft, nontender,   Extremities:  Trace to 1+ peripheral edema.  Neurologic: Alert to self and place only  Skin: No acute lesions  Access: Left AVG, good bruit    Basic Metabolic Panel:  Recent Labs Lab 12/10/16 0327 12/11/16 1617 12/13/16 0330 12/14/16 1043  NA 139 137 137 135  K 3.3* 4.0 3.9 4.2  CL 97* 98* 97* 96*  CO2 30 26 29 22   GLUCOSE 95 112* 129* 117*  BUN 19 38* 35* 44*  CREATININE 2.65* 3.89* 3.87* 4.54*  CALCIUM 8.6* 8.5* 9.0 8.7*  MG 1.8  --   --   --   PHOS  --  6.1*  --   --     Liver Function Tests:  Recent Labs Lab 12/10/16 0327 12/11/16 1617 12/14/16 1043  AST 28  --  46*  ALT 22  --  34  ALKPHOS 113  --  142*   BILITOT 1.0  --  1.0  PROT 6.2*  --  6.2*  ALBUMIN 3.4* 3.2* 3.2*   No results for input(s): LIPASE, AMYLASE in the last 168 hours. No results for input(s): AMMONIA in the last 168 hours.  CBC:  Recent Labs Lab 12/10/16 0327 12/11/16 1617 12/13/16 0330 12/14/16 1043  WBC 5.7 5.8 7.1 6.9  NEUTROABS 4.4  --   --  5.7  HGB 12.4 12.7 13.1 12.8  HCT 37.7 38.4 39.5 39.1  MCV 98.7 98.4 98.7 97.9  PLT 99* 87* 107* 122*    Cardiac Enzymes:  Recent Labs Lab 12/13/16 0615 12/14/16 1043 12/14/16 1449 12/14/16 1940 12/15/16 0145  TROPONINI 0.09* 0.11* 0.09* 0.10* 0.09*    BNP: Invalid input(s): POCBNP  CBG: No results for input(s): GLUCAP in the last 168 hours.  Microbiology: Results for orders placed or performed during the hospital encounter of 12/10/16  Blood culture (routine x 2)     Status: None   Collection Time: 12/10/16  4:15 AM  Result Value Ref Range Status   Specimen Description BLOOD RIGHT FOREARM  Final   Special Requests   Final    BOTTLES DRAWN AEROBIC AND ANAEROBIC Blood Culture adequate volume   Culture  NO GROWTH 5 DAYS  Final   Report Status 12/15/2016 FINAL  Final  Blood culture (routine x 2)     Status: None   Collection Time: 12/10/16  4:22 AM  Result Value Ref Range Status   Specimen Description BLOOD RIGHT ANTECUBITAL  Final   Special Requests   Final    BOTTLES DRAWN AEROBIC AND ANAEROBIC Blood Culture adequate volume   Culture NO GROWTH 5 DAYS  Final   Report Status 12/15/2016 FINAL  Final  MRSA PCR Screening     Status: None   Collection Time: 12/10/16  6:34 AM  Result Value Ref Range Status   MRSA by PCR NEGATIVE NEGATIVE Final    Comment:        The GeneXpert MRSA Assay (FDA approved for NASAL specimens only), is one component of a comprehensive MRSA colonization surveillance program. It is not intended to diagnose MRSA infection nor to guide or monitor treatment for MRSA infections.     Coagulation Studies:  Recent Labs   12/14/16 1043 12/15/16 0145  LABPROT 30.3* 34.3*  INR 2.93 3.43    Urinalysis:  Recent Labs  12/13/16 0547  COLORURINE YELLOW*  LABSPEC 1.013  PHURINE 5.0  GLUCOSEU NEGATIVE  HGBUR SMALL*  BILIRUBINUR NEGATIVE  KETONESUR NEGATIVE  PROTEINUR 100*  NITRITE NEGATIVE  LEUKOCYTESUR NEGATIVE      Imaging: Dg Chest 2 View  Result Date: 12/14/2016 CLINICAL DATA:  Shortness of breath. EXAM: CHEST  2 VIEW COMPARISON:  12/10/2016 FINDINGS: The cardiac silhouette remains enlarged. Aortic atherosclerotic cysts is noted. The lungs are hypoinflated, slightly more so than on the prior study. There is central pulmonary vascular congestion with diffuse interstitial densities bilaterally which have mildly increased. Small pleural effusions are present. No pneumothorax is seen. A left shoulder arthroplasty is again noted. IMPRESSION: Mild worsening of interstitial edema.  Small pleural effusions. Electronically Signed   By: Logan Bores M.D.   On: 12/14/2016 11:11     Medications:       Assessment/ Plan:  [name] with hypertension, hypothyroidism, atrial fibrillation, hyperlipidemia, dementia, overactive bladder. First dialysis 08/24/15   TTS [facility] [place] [facility]  1. End Stage Renal Disease: TTS. patient presents with acute pulmonary edema.  -We will plan for dialysis tomorrow  2.  Acute pulmonary edema -Improved with fluid removal.  2.5 L were removed -Continue torsemide  3. Anemia of chronic kidney disease: hemoglobin 12.8 - Mircera as outpatient.  -We will hold Procrit for now  4. Secondary Hyperparathyroidism with hyperphosphatemia:   monitor phosphorus during hospital stay   LOS: 1 Kerry Walsh 10/26/20184:02 PM

## 2016-12-15 NOTE — Consult Note (Signed)
Reason for Consult:CHF, SOB, CM Referring Physician: Dr Benjie Karvonen hospitalist, [facility]  Kerry Walsh is an 83 y.o. female.  HPI: Pt is a 83 y/o WF with a history of ERSD, HTN, SOB, CAD AFIB, Anemia, Anxiety, CHF Dementia who presents with recurrent CHF and SOB. She has had multiple admission recently for DOE SOB and weakness. Patient has had mild to mod CM EF=45%.  Past Medical History:  Diagnosis Date  . Anemia   . Anxiety   . Arthritis   . Atrial fibrillation ([place])   . Cancer [facility])    Carcinoid Tumor  . CHF (congestive heart failure) ([place])   . Chronic kidney disease    CKD with last known creatinine fo 2.4 in Dec 2015  . Coronary artery disease   . Dementia   . Depression   . Dialysis patient [facility])    Mon. Wed. Fri.  . Dysrhythmia    Atrial Fibrillation  . Hypertension   . Hypothyroidism   . Malignant poorly differentiated neuroendocrine carcinoma ([place]) 08/12/2014  . Mitral valve regurgitation   . Osteoporosis   . Stroke [facility])    No residual neurological deficits    Past Surgical History:  Procedure Laterality Date  . ABDOMINAL HYSTERECTOMY    . AV FISTULA PLACEMENT Left 01/19/2016   Procedure: INSERTION OF ARTERIOVENOUS (AV) GORE-TEX GRAFT ARM ( BRACH / AXILLARY );  Surgeon: Katha Cabal, MD;  Location: [facility] ORS;  Service: Vascular;  Laterality: Left;  . BACK SURGERY    . CHOLECYSTECTOMY    . EYE SURGERY Bilateral    Cataract Extraction with IOL  . JOINT REPLACEMENT Bilateral    Knee replacement surgery- bilateral  . PERIPHERAL VASCULAR CATHETERIZATION N/A 07/27/2015   Procedure: Dialysis/Perma Catheter Insertion;  Surgeon: Katha Cabal, MD;  Location: [place] CV LAB;  Service: Cardiovascular;  Laterality: N/A;  . PERIPHERAL VASCULAR CATHETERIZATION N/A 03/21/2016   Procedure: Dialysis/Perma Catheter Removal;  Surgeon: Katha Cabal, MD;  Location: [place] CV LAB;  Service: Cardiovascular;  Laterality: N/A;  . SHOULDER SURGERY Bilateral      Family History  Problem Relation Age of Onset  . CAD Father   . Stroke Father   . Bladder Cancer Father   . Kidney failure Mother   . Kidney cancer Neg Hx     Social History:  reports that she has never smoked. She has never used smokeless tobacco. She reports that she does not drink alcohol or use drugs.  Allergies:  Allergies  Allergen Reactions  . Penicillins Rash    Has patient had a PCN reaction causing immediate rash, facial/tongue/throat swelling, SOB or lightheadedness with hypotension: Yes Has patient had a PCN reaction causing severe rash involving mucus membranes or skin necrosis: No Has patient had a PCN reaction that required hospitalization No Has patient had a PCN reaction occurring within the last 10 years: No If all of the above answers are "NO", then may proceed with Cephalosporin use.     Medications: I have reviewed the patient's current medications.  Results for orders placed or performed during the hospital encounter of 12/14/16 (from the past 48 hour(s))  Comprehensive metabolic panel     Status: Abnormal   Collection Time: 12/14/16 10:43 AM  Result Value Ref Range   Sodium 135 135 - 145 mmol/L   Potassium 4.2 3.5 - 5.1 mmol/L   Chloride 96 (L) 101 - 111 mmol/L   CO2 22 22 - 32 mmol/L   Glucose, Bld 117 (H)  65 - 99 mg/dL   BUN 44 (H) 6 - 20 mg/dL   Creatinine, Ser 4.54 (H) 0.44 - 1.00 mg/dL   Calcium 8.7 (L) 8.9 - 10.3 mg/dL   Total Protein 6.2 (L) 6.5 - 8.1 g/dL   Albumin 3.2 (L) 3.5 - 5.0 g/dL   AST 46 (H) 15 - 41 U/L   ALT 34 14 - 54 U/L   Alkaline Phosphatase 142 (H) 38 - 126 U/L   Total Bilirubin 1.0 0.3 - 1.2 mg/dL   GFR calc non Af Amer 8 (L) >60 mL/min   GFR calc Af Amer 9 (L) >60 mL/min    Comment: (NOTE) The eGFR has been calculated using the CKD EPI equation. This calculation has not been validated in all clinical situations. eGFR's persistently <60 mL/min signify possible Chronic Kidney Disease.    Anion gap 17 (H) 5 - 15   CBC with Differential     Status: Abnormal   Collection Time: 12/14/16 10:43 AM  Result Value Ref Range   WBC 6.9 3.6 - 11.0 K/uL   RBC 4.00 3.80 - 5.20 MIL/uL   Hemoglobin 12.8 12.0 - 16.0 g/dL   HCT 39.1 35.0 - 47.0 %   MCV 97.9 80.0 - 100.0 fL   MCH 32.1 26.0 - 34.0 pg   MCHC 32.8 32.0 - 36.0 g/dL   RDW 14.8 (H) 11.5 - 14.5 %   Platelets 122 (L) 150 - 440 K/uL   Neutrophils Relative % 81 %   Neutro Abs 5.7 1.4 - 6.5 K/uL   Lymphocytes Relative 9 %   Lymphs Abs 0.6 (L) 1.0 - 3.6 K/uL   Monocytes Relative 8 %   Monocytes Absolute 0.5 0.2 - 0.9 K/uL   Eosinophils Relative 1 %   Eosinophils Absolute 0.0 0 - 0.7 K/uL   Basophils Relative 1 %   Basophils Absolute 0.1 0 - 0.1 K/uL  Troponin I     Status: Abnormal   Collection Time: 12/14/16 10:43 AM  Result Value Ref Range   Troponin I 0.11 (HH) <0.03 ng/mL    Comment: CRITICAL RESULT CALLED TO, READ BACK BY AND VERIFIED WITH: ASHLEY SMITH 12/14/16 1255 KLW   Brain natriuretic peptide     Status: Abnormal   Collection Time: 12/14/16 10:43 AM  Result Value Ref Range   B Natriuretic Peptide >4,500.0 (H) 0.0 - 100.0 pg/mL    Comment: RESULT CONFIRMED BY MANUAL DILUTION KLW  Protime-INR     Status: Abnormal   Collection Time: 12/14/16 10:43 AM  Result Value Ref Range   Prothrombin Time 30.3 (H) 11.4 - 15.2 seconds   INR 2.93   TSH     Status: Abnormal   Collection Time: 12/14/16  2:49 PM  Result Value Ref Range   TSH 5.106 (H) 0.350 - 4.500 uIU/mL    Comment: Performed by a 3rd Generation assay with a functional sensitivity of <=0.01 uIU/mL.  Troponin I     Status: Abnormal   Collection Time: 12/14/16  2:49 PM  Result Value Ref Range   Troponin I 0.09 (HH) <0.03 ng/mL    Comment: CRITICAL VALUE NOTED. VALUE IS CONSISTENT WITH PREVIOUSLY REPORTED/CALLED VALUE KLW  Troponin I     Status: Abnormal   Collection Time: 12/14/16  7:40 PM  Result Value Ref Range   Troponin I 0.10 (HH) <0.03 ng/mL    Comment: CRITICAL VALUE  NOTED. VALUE IS CONSISTENT WITH PREVIOUSLY REPORTED/CALLED VALUE [place]  Troponin I     Status: Abnormal  Collection Time: 12/15/16  1:45 AM  Result Value Ref Range   Troponin I 0.09 (HH) <0.03 ng/mL    Comment: CRITICAL VALUE NOTED. VALUE IS CONSISTENT WITH PREVIOUSLY REPORTED/CALLED VALUE [facility]  Protime-INR     Status: Abnormal   Collection Time: 12/15/16  1:45 AM  Result Value Ref Range   Prothrombin Time 34.3 (H) 11.4 - 15.2 seconds   INR 3.43     Dg Chest 2 View  Result Date: 12/14/2016 CLINICAL DATA:  Shortness of breath. EXAM: CHEST  2 VIEW COMPARISON:  12/10/2016 FINDINGS: The cardiac silhouette remains enlarged. Aortic atherosclerotic cysts is noted. The lungs are hypoinflated, slightly more so than on the prior study. There is central pulmonary vascular congestion with diffuse interstitial densities bilaterally which have mildly increased. Small pleural effusions are present. No pneumothorax is seen. A left shoulder arthroplasty is again noted. IMPRESSION: Mild worsening of interstitial edema.  Small pleural effusions. Electronically Signed   By: Logan Bores M.D.   On: 12/14/2016 11:11    Review of Systems  Constitutional: Positive for malaise/fatigue.  HENT: Positive for congestion.   Eyes: Negative.   Respiratory: Positive for cough and shortness of breath.   Cardiovascular: Positive for orthopnea, leg swelling and PND.  Gastrointestinal: Negative.   Genitourinary: Negative.   Musculoskeletal: Positive for myalgias.  Skin: Negative.   Neurological: Positive for weakness.  Endo/Heme/Allergies: Negative.   Psychiatric/Behavioral: Negative.    Blood pressure (!) 131/55, pulse 64, temperature (!) 97.5 F (36.4 C), temperature source Oral, resp. rate 14, height 5' 5" (1.651 m), weight 138 lb 11.2 oz (62.9 kg), SpO2 97 %. Physical Exam  Nursing note and vitals reviewed. Constitutional: She is oriented to person, place, and time. She appears well-developed and well-nourished.   HENT:  Head: Atraumatic.  Eyes: Pupils are equal, round, and reactive to light. Conjunctivae and EOM are normal.  Neck: Normal range of motion. Neck supple.  Cardiovascular: Normal rate and regular rhythm.  Exam reveals gallop.   Murmur heard. Respiratory: Effort normal. She has decreased breath sounds. She has rhonchi.  GI: Soft. Bowel sounds are normal.  Musculoskeletal: Normal range of motion.  Neurological: She is alert and oriented to person, place, and time. She has normal reflexes.  Skin: Skin is warm and dry.  Psychiatric: She has a normal mood and affect.    Assessment/Plan: Shortness of Breath CHF CM Congestion ESRD Severe MR Pulmonary Edema AFIB HTN  PLAN Agree with aggressive therapy for fluid retention Continue hypertension control metoprolol and Norvasc Supplemental oxygen therapy with hypoxemia Synthroid for hypothyroidism AFIB control chronic , continue coumadin Agree with Metoprolol for rate  Continue dialysis M W F F/U cardiology as outpt    Dwayne D Callwood 12/15/2016, 10:58 AM

## 2016-12-15 NOTE — Clinical Social Work Note (Signed)
Clinical Social Work Assessment  Patient Details  Name: Kerry Walsh MRN: [id number] Date of Birth: Jun 06, 1925  Date of referral:  12/15/16               Reason for consult:  Discharge Planning                Permission sought to share information with:    Permission granted to share information::     Name::        Agency::     Relationship::     Contact Information:     Housing/Transportation Living arrangements for the past 2 months:  [place] of Information:  Facility, Adult Children Patient Interpreter Needed:  None Criminal Activity/Legal Involvement Pertinent to Current Situation/Hospitalization:  No - Comment as needed Significant Relationships:  Adult Children Lives with:  Facility Resident Do you feel safe going back to the place where you live?  Yes Need for family participation in patient care:  Yes (Comment)  Care giving concerns:  Patient is a long term resident of [facility]   Social Worker assessment / plan:  CSW spoke with Levada Dy at [facility] and informed her that patient will be a new oxygen at discharge. Levada Dy informed me that patient is already followed by [facility] health. Levada Dy is stating that they cannot (but when pressed further she said prefer not) to accept patients back over the weekend because they don't have staff there. CSW informed her that unfortunately if patient is ready for discharge over the weekend, they will need to take her back and not wait until Monday.   CSW has spoken to patient's daughter, Ms. Volanda Napoleon, and she stated that she does wish for her mother to return to [facility] when time and that they will transport patient.   Employment status:  Retired Forensic scientist:  Medicare PT Recommendations:    Information / Referral to community resources:     Patient/Family's Response to care:  Patient's daughter expressed appreciation for CSW contact.  Patient/Family's Understanding  of and Emotional Response to Diagnosis, Current Treatment, and Prognosis:  Patient's daughter is involved in patient's care and well-being.  Emotional Assessment Appearance:  Appears stated age Attitude/Demeanor/Rapport:    Affect (typically observed):  Calm Orientation:  Oriented to Self, Oriented to Place Alcohol / Substance use:  Not Applicable Psych involvement (Current and /or in the community):  No (Comment)  Discharge Needs  Concerns to be addressed:  Care Coordination Readmission within the last 30 days:  Yes Current discharge risk:  None Barriers to Discharge:  No Barriers Identified   Shela Leff, LCSW 12/15/2016, 1:06 PM

## 2016-12-15 NOTE — Care Management (Signed)
Patient admitted for acute respiratory failure due to CHF.   Patient presents from [facility].  Patient is open with [facility] for RN and PT.  Tim with [facility] notified of admission.  Patient currently requiring acute O2.  Qualifying saturations documented.  Jermaine with [facility] notified of heads up referral for O2.  RNCM following for discharge planning.

## 2016-12-15 NOTE — Progress Notes (Signed)
[place] at [facility]                                                                                                                                                                                  Patient Demographics   Kerry Walsh, is a 83 y.o. female, DOB - [birth year], JKD:[id number]  Admit date - 12/14/2016   Admitting Physician Bettey Costa, MD  Outpatient Primary MD for the patient is [facility]   LOS - 1  Subjective: Patient's shortness of breath improved She seems to be a very poor historian and little confused    Review of Systems:   CONSTITUTIONAL: unable to provide due to her mental status  Vitals:   Vitals:   12/15/16 0458 12/15/16 0500 12/15/16 0831 12/15/16 1248  BP: (!) 119/53  (!) 131/55 (!) 113/52  Pulse: 62  64 62  Resp: 20  14 16   Temp: (!) 97.5 F (36.4 C)  (!) 97.5 F (36.4 C) 97.8 F (36.6 C)  TempSrc: Oral  Oral Oral  SpO2: 97%  97% 97%  Weight:  138 lb 11.2 oz (62.9 kg)    Height:        Wt Readings from Last 3 Encounters:  12/15/16 138 lb 11.2 oz (62.9 kg)  12/13/16 181 lb (82.1 kg)  12/12/16 181 lb 9.6 oz (82.4 kg)     Intake/Output Summary (Last 24 hours) at 12/15/16 1506 Last data filed at 12/15/16 1300  Gross per 24 hour  Intake              243 ml  Output             2500 ml  Net            -2257 ml    Physical Exam:   GENERAL: Pleasant-appearing in no apparent distress.  HEAD, EYES, EARS, NOSE AND THROAT: Atraumatic, normocephalic. Extraocular muscles are intact. Pupils equal and reactive to light. Sclerae anicteric. No conjunctival injection. No oro-pharyngeal erythema.  NECK: Supple. There is no jugular venous distention. No bruits, no lymphadenopathy, no thyromegaly.  HEART: Regular rate and rhythm,. No murmurs, no rubs, no clicks.  LUNGS: crackles at the bases no associated muscle usage  ABDOMEN: Soft, flat, nontender, nondistended. Has good bowel sounds. No  hepatosplenomegaly appreciated.  EXTREMITIES: No evidence of any cyanosis, clubbing, or peripheral edema.  +2 pedal and radial pulses bilaterally.  NEUROLOGIC: The patient is alert, awake, and oriented x3 with no focal motor or sensory deficits appreciated bilaterally.  SKIN: Moist and warm with no rashes appreciated.  Psych: Not anxious, depressed LN: No inguinal LN enlargement    Antibiotics  Anti-infectives    None      Medications   Scheduled Meds: . amLODipine  5 mg Oral BH-q7a  . calcitRIOL  0.25 mcg Oral Daily  . levothyroxine  88 mcg Oral QAC breakfast  . metoprolol tartrate  50 mg Oral BID  . sodium chloride flush  3 mL Intravenous Q12H  . torsemide  40 mg Oral Daily  . Warfarin - Pharmacist Dosing Inpatient   Does not apply q1800   Continuous Infusions: . sodium chloride     PRN Meds:.sodium chloride, acetaminophen **OR** acetaminophen, bisacodyl, guaiFENesin-dextromethorphan, HYDROcodone-acetaminophen, ondansetron **OR** ondansetron (ZOFRAN) IV, senna-docusate, sodium chloride flush, traMADol   Data Review:   Micro Results Recent Results (from the past 240 hour(s))  Blood culture (routine x 2)     Status: None   Collection Time: 12/10/16  4:15 AM  Result Value Ref Range Status   Specimen Description BLOOD RIGHT FOREARM  Final   Special Requests   Final    BOTTLES DRAWN AEROBIC AND ANAEROBIC Blood Culture adequate volume   Culture NO GROWTH 5 DAYS  Final   Report Status 12/15/2016 FINAL  Final  Blood culture (routine x 2)     Status: None   Collection Time: 12/10/16  4:22 AM  Result Value Ref Range Status   Specimen Description BLOOD RIGHT ANTECUBITAL  Final   Special Requests   Final    BOTTLES DRAWN AEROBIC AND ANAEROBIC Blood Culture adequate volume   Culture NO GROWTH 5 DAYS  Final   Report Status 12/15/2016 FINAL  Final  MRSA PCR Screening     Status: None   Collection Time: 12/10/16  6:34 AM  Result Value Ref Range Status   MRSA by PCR NEGATIVE  NEGATIVE Final    Comment:        The GeneXpert MRSA Assay (FDA approved for NASAL specimens only), is one component of a comprehensive MRSA colonization surveillance program. It is not intended to diagnose MRSA infection nor to guide or monitor treatment for MRSA infections.     Radiology Reports Dg Chest 2 View  Result Date: 12/14/2016 CLINICAL DATA:  Shortness of breath. EXAM: CHEST  2 VIEW COMPARISON:  12/10/2016 FINDINGS: The cardiac silhouette remains enlarged. Aortic atherosclerotic cysts is noted. The lungs are hypoinflated, slightly more so than on the prior study. There is central pulmonary vascular congestion with diffuse interstitial densities bilaterally which have mildly increased. Small pleural effusions are present. No pneumothorax is seen. A left shoulder arthroplasty is again noted. IMPRESSION: Mild worsening of interstitial edema.  Small pleural effusions. Electronically Signed   By: Logan Bores M.D.   On: 12/14/2016 11:11   Dg Thoracic Spine 2 View  Result Date: 12/13/2016 CLINICAL DATA:  Acute onset of upper back pain.  Initial encounter. EXAM: THORACIC SPINE 2 VIEWS COMPARISON:  Chest radiograph performed 07/16/2015 FINDINGS: There is no evidence of fracture or subluxation. Vertebral bodies demonstrate normal height and alignment. Intervertebral disc spaces are preserved. The visualized portions of both lungs are clear. The mediastinum is unremarkable in appearance. Scattered calcification is seen along the thoracic and proximal abdominal aorta. IMPRESSION: 1. No evidence of fracture or subluxation along the thoracic spine. 2. Scattered aortic atherosclerosis. Electronically Signed   By: Garald Balding M.D.   On: 12/13/2016 05:34   Dg Lumbar Spine Complete  Result Date: 12/13/2016 CLINICAL DATA:  Acute onset of right flank pain.  Initial encounter. EXAM: LUMBAR SPINE - COMPLETE 4+ VIEW COMPARISON:  CT of the abdomen and pelvis  from 09/14/2016 FINDINGS: There is no  evidence of fracture or subluxation. The patient is status post lumbar spinal fusion at L3-S1. Vertebral bodies demonstrate normal height and alignment. Intervertebral disc spaces are grossly preserved. The visualized bowel gas pattern is unremarkable in appearance; air and stool are noted within the colon. The sacroiliac joints are within normal limits. Scattered calcification is seen along the abdominal aorta. IMPRESSION: 1. No evidence of fracture or subluxation along the lumbar spine. Status post lumbosacral spinal fusion at L3-S1. 2. Scattered aortic atherosclerosis. Electronically Signed   By: Garald Balding M.D.   On: 12/13/2016 05:33   Dg Chest Port 1 View  Result Date: 12/10/2016 CLINICAL DATA:  Acute dyspnea EXAM: PORTABLE CHEST 1 VIEW COMPARISON:  07/16/2015 FINDINGS: Stable cardiomegaly with thoracic aortic atherosclerosis. Central vascular congestion with interstitial edema is noted. Trace bilateral pleural effusions with slight blunting of the costophrenic angles. Left shoulder arthroplasty appears intact. Osteoarthritic native right glenohumeral joint. IMPRESSION: Stable cardiomegaly with aortic atherosclerosis. Mild central vascular congestion with interstitial edema, increased relative to prior comparison. Electronically Signed   By: Ashley Royalty M.D.   On: 12/10/2016 03:42     CBC  Recent Labs Lab 12/10/16 0327 12/11/16 1617 12/13/16 0330 12/14/16 1043  WBC 5.7 5.8 7.1 6.9  HGB 12.4 12.7 13.1 12.8  HCT 37.7 38.4 39.5 39.1  PLT 99* 87* 107* 122*  MCV 98.7 98.4 98.7 97.9  MCH 32.6 32.4 32.8 32.1  MCHC 33.0 32.9 33.3 32.8  RDW 15.1* 14.8* 14.9* 14.8*  LYMPHSABS 0.7*  --   --  0.6*  MONOABS 0.4  --   --  0.5  EOSABS 0.1  --   --  0.0  BASOSABS 0.1  --   --  0.1    Chemistries   Recent Labs Lab 12/10/16 0327 12/11/16 1617 12/13/16 0330 12/14/16 1043  NA 139 137 137 135  K 3.3* 4.0 3.9 4.2  CL 97* 98* 97* 96*  CO2 30 26 29 22   GLUCOSE 95 112* 129* 117*  BUN 19  38* 35* 44*  CREATININE 2.65* 3.89* 3.87* 4.54*  CALCIUM 8.6* 8.5* 9.0 8.7*  MG 1.8  --   --   --   AST 28  --   --  46*  ALT 22  --   --  34  ALKPHOS 113  --   --  142*  BILITOT 1.0  --   --  1.0   ------------------------------------------------------------------------------------------------------------------ estimated creatinine clearance is 7.6 mL/min (A) (by C-G formula based on SCr of 4.54 mg/dL (H)). ------------------------------------------------------------------------------------------------------------------ No results for input(s): HGBA1C in the last 72 hours. ------------------------------------------------------------------------------------------------------------------ No results for input(s): CHOL, HDL, LDLCALC, TRIG, CHOLHDL, LDLDIRECT in the last 72 hours. ------------------------------------------------------------------------------------------------------------------  Recent Labs  12/14/16 1449  TSH 5.106*   ------------------------------------------------------------------------------------------------------------------ No results for input(s): VITAMINB12, FOLATE, FERRITIN, TIBC, IRON, RETICCTPCT in the last 72 hours.  Coagulation profile  Recent Labs Lab 12/10/16 0327 12/11/16 0430 12/12/16 0554 12/14/16 1043 12/15/16 0145  INR 1.91 2.17 2.55 2.93 3.43    No results for input(s): DDIMER in the last 72 hours.  Cardiac Enzymes  Recent Labs Lab 12/14/16 1449 12/14/16 1940 12/15/16 0145  TROPONINI 0.09* 0.10* 0.09*   ------------------------------------------------------------------------------------------------------------------ Invalid input(s): POCBNP    Assessment & Plan   83 year old female with history of combined systolic and diastolic heart failure, severe mitral regurgitation, atrial fibrillation currently on warfarin and end-stage renal disease on hemodialysis who presents with worsening shortness of breath and lower extremity  edema.  1. Acute hypoxic respiratory failure in the setting of combined systolic and diastolic congestive heart failure Patient recently discharged from the hospital may need oxygen on discharge   2. Combined systolic and diastolic heart failure with ejection fraction of 45% and severe mitral regurgitation by echocardiogram in July Cardiology consult pending Hemodialysis yesterday  3. ESRD on hemodialysis: patient seen by nephrology  4. Atrial fibrillation, chronic: Coumadin as per pharmacy consultation Continue metoprolol for heart rate control  5. Essential hypertension: Continue metoprolol and Norvasc   6. Hypothyroidism: Continue Synthroid       Code Status Orders        Start     Ordered   12/14/16 1341  Do not attempt resuscitation (DNR)  Continuous    Question Answer Comment  In the event of cardiac or respiratory ARREST Do not call a "code blue"   In the event of cardiac or respiratory ARREST Do not perform Intubation, CPR, defibrillation or ACLS   In the event of cardiac or respiratory ARREST Use medication by any route, position, wound care, and other measures to relive pain and suffering. May use oxygen, suction and manual treatment of airway obstruction as needed for comfort.      12/14/16 1340    Code Status History    Date Active Date Inactive Code Status Order ID Comments User Context   12/10/2016  6:53 AM 12/12/2016  4:23 PM Full Code [id number]  Harrie Foreman, MD Inpatient   09/26/2014 12:19 AM 09/26/2014  3:48 PM Full Code [id number]  Lance Coon, MD Inpatient   06/28/2014  5:47 PM 06/29/2014  2:40 PM Full Code [id number]  Gladstone Lighter, MD Inpatient    Advance Directive Documentation     Most Recent Value  Type of Advance Directive  Healthcare Power of Attorney  Pre-existing out of facility DNR order (yellow form or pink MOST form)  -  "MOST" Form in Place?  -           [facility], nephrology  DVT Prophylaxis  Coumadin  Lab Results  Component Value Date   PLT 122 (L) 12/14/2016     Time Spent in minutes  35 minutes Greater than 50% of time spent in care coordination and counseling patient regarding the condition and plan of care.   Dustin Flock M.D on 12/15/2016 at 3:06 PM  Between 7am to 6pm - Pager - [phone]  After 6pm go to [email or web address] - password [email or web address] [facility]   Office  [phone]

## 2016-12-15 NOTE — Care Management (Signed)
Per PT  SATURATION QUALIFICATIONS: (This note is used to comply with regulatory documentation for home oxygen)  Patient Saturations on Room Air at Rest = 90%  Patient Saturations on Room Air while Ambulating = 84%  Patient Saturations on 2 Liters of oxygen while Ambulating = 93%  Please briefly explain why patient needs home oxygen: to maintain exertional O2 sats

## 2016-12-15 NOTE — Progress Notes (Signed)
ANTICOAGULATION CONSULT NOTE - Initial Consult  Pharmacy Consult for warfarin Indication: atrial fibrillation  Allergies  Allergen Reactions  . Penicillins Rash    Has patient had a PCN reaction causing immediate rash, facial/tongue/throat swelling, SOB or lightheadedness with hypotension: Yes Has patient had a PCN reaction causing severe rash involving mucus membranes or skin necrosis: No Has patient had a PCN reaction that required hospitalization No Has patient had a PCN reaction occurring within the last 10 years: No If all of the above answers are "NO", then may proceed with Cephalosporin use.     Patient Measurements: Height: 5\' 5"  (165.1 cm) Weight: 138 lb 11.2 oz (62.9 kg) IBW/kg (Calculated) : 57   Vital Signs: Temp: 97.5 F (36.4 C) (10/26 0831) Temp Source: Oral (10/26 0831) BP: 131/55 (10/26 0831) Pulse Rate: 64 (10/26 0831)  Labs:  Recent Labs  12/13/16 0330  12/14/16 1043 12/14/16 1449 12/14/16 1940 12/15/16 0145  HGB 13.1  --  12.8  --   --   --   HCT 39.5  --  39.1  --   --   --   PLT 107*  --  122*  --   --   --   LABPROT  --   --  30.3*  --   --  34.3*  INR  --   --  2.93  --   --  3.43  CREATININE 3.87*  --  4.54*  --   --   --   TROPONINI 0.10*  < > 0.11* 0.09* 0.10* 0.09*  < > = values in this interval not displayed.  Estimated Creatinine Clearance: 7.6 mL/min (A) (by C-G formula based on SCr of 4.54 mg/dL (H)).  Assessment: 83 yo female on warfarin prior to admission for AFib. Pt with ESRD on HD admitted for acute exacerbation of CHF.  INR has been trending up steadily since previous admission earlier this week. Pt had been receiving 3 mg of warfarin during the previous admission. INR now borderline for being high. PTA med list notes dose of warfarin 4 mg daily. Pt off floor at dialysis currently.   DATE INR DOSE 10/25 2.93 2 mg 10/26 3.43 HELD  Goal of Therapy:  INR 2-3 Monitor platelets by anticoagulation protocol: Yes   Plan:   Hold VKA this evening. Will recheck INR tomorrow with AM labs.  Pharmacy will continue to follow.   Laural Benes, Pharm.D., BCPS Clinical Pharmacist 12/15/2016,10:58 AM

## 2016-12-16 ENCOUNTER — Inpatient Hospital Stay: Payer: Medicare Other

## 2016-12-16 LAB — RENAL FUNCTION PANEL
ALBUMIN: 3 g/dL — AB (ref 3.5–5.0)
ANION GAP: 14 (ref 5–15)
BUN: 44 mg/dL — AB (ref 6–20)
CALCIUM: 8.6 mg/dL — AB (ref 8.9–10.3)
CO2: 24 mmol/L (ref 22–32)
Chloride: 95 mmol/L — ABNORMAL LOW (ref 101–111)
Creatinine, Ser: 4.17 mg/dL — ABNORMAL HIGH (ref 0.44–1.00)
GFR calc Af Amer: 10 mL/min — ABNORMAL LOW (ref >60)
GFR, EST NON AFRICAN AMERICAN: 9 mL/min — AB (ref >60)
GLUCOSE: 128 mg/dL — AB (ref 65–99)
PHOSPHORUS: 5.6 mg/dL — AB (ref 2.5–4.6)
POTASSIUM: 4.3 mmol/L (ref 3.5–5.1)
SODIUM: 133 mmol/L — AB (ref 135–145)

## 2016-12-16 LAB — CBC
HEMATOCRIT: 35.8 % (ref 35.0–47.0)
Hemoglobin: 11.8 g/dL — ABNORMAL LOW (ref 12.0–16.0)
MCH: 32.1 pg (ref 26.0–34.0)
MCHC: 33 g/dL (ref 32.0–36.0)
MCV: 97.3 fL (ref 80.0–100.0)
Platelets: 130 10*3/uL — ABNORMAL LOW (ref 150–440)
RBC: 3.68 MIL/uL — ABNORMAL LOW (ref 3.80–5.20)
RDW: 14.6 % — ABNORMAL HIGH (ref 11.5–14.5)
WBC: 6.9 10*3/uL (ref 3.6–11.0)

## 2016-12-16 LAB — PROTIME-INR
INR: 2.74
PROTHROMBIN TIME: 28.8 s — AB (ref 11.4–15.2)

## 2016-12-16 MED ORDER — WARFARIN SODIUM 2 MG PO TABS
2.0000 mg | ORAL_TABLET | Freq: Once | ORAL | Status: AC
  Administered 2016-12-16: 2 mg via ORAL
  Filled 2016-12-16: qty 1

## 2016-12-16 MED ORDER — WARFARIN SODIUM 2 MG PO TABS: 2.0000 mg | ORAL_TABLET | Freq: Every day | ORAL | 0 refills | Status: AC

## 2016-12-16 NOTE — Progress Notes (Signed)
Pre hd assessment  

## 2016-12-16 NOTE — Progress Notes (Signed)
Hd start, resting, no c/o, stable, pt said she does not dialyze more than 3 hours. Expecting a friend to visit her in a few hours and wants to be back in room.

## 2016-12-16 NOTE — Progress Notes (Signed)
Staff from [facility] called stating that the patient would not be able to return today. Report had previously been called by this RN. Social worker had also been in contact with someone at the facility previously as well. Dr. Anselm Jungling was paged and notified that Kerry Walsh would not be able to return to [facility] today. Oxygen tank has already been delivered.

## 2016-12-16 NOTE — Progress Notes (Signed)
Post hd vitals 

## 2016-12-16 NOTE — Discharge Instructions (Signed)
Take medications as prescribed. Please be sure to wear the oxygen at all times until further notice and avoid being around cigarette smoking with the oxygen. Continue your regular dialysis schedule. Please contact your doctor if you should have any increased pain, nausea, vomiting, fever, shortness of breath and swelling. Please call 911 or report to your nearest emergency room if you should experience severe shortness of breath.

## 2016-12-16 NOTE — Progress Notes (Signed)
SATURATION QUALIFICATIONS: (This note is used to comply with regulatory documentation for home oxygen)  Patient Saturations on Room Air at Rest = 90%  Patient Saturations on Room Air while Ambulating = 84%  Patient Saturations on 2 Liters of oxygen while Ambulating = 95%  Please briefly explain why patient needs home oxygen: Chronic CHF, mitral valve regurgitation. Report given by Greggory Stallion, PT, DPT

## 2016-12-16 NOTE — Progress Notes (Signed)
[facility]  ROUNDING NOTE   Subjective:   [name] admitted to [facility] on 12/14/2016 for Dyspnea, unspecified type [R06.00] Seen during HD   HEMODIALYSIS FLOWSHEET:  Blood Flow Rate (mL/min): 300 mL/min Arterial Pressure (mmHg): -110 mmHg Venous Pressure (mmHg): 160 mmHg Transmembrane Pressure (mmHg): 60 mmHg Ultrafiltration Rate (mL/min): 650 mL/min Dialysate Flow Rate (mL/min): 600 ml/min Conductivity: Machine : 15.3 Conductivity: Machine : 15.3 Dialysis Fluid Bolus: Normal Saline Bolus Amount (mL): 250 mL Dialysate Change:  (3k)  Feels better overall. No acute c/o  Objective:  Vital signs in last 24 hours:  Temp:  [97.3 F (36.3 C)-98.9 F (37.2 C)] 97.8 F (36.6 C) (10/27 1335) Pulse Rate:  [67-76] 72 (10/27 1411) Resp:  [15-22] 22 (10/27 1342) BP: (122-149)/(52-97) 135/63 (10/27 1411) SpO2:  [85 %-100 %] 97 % (10/27 1510) Weight:  [63 kg (138 lb 14.2 oz)-65.8 kg (145 lb 1.6 oz)] 63 kg (138 lb 14.2 oz) (10/27 1335)  Weight change: -16.284 kg (-35 lb 14.4 oz) Filed Weights   12/15/16 0500 12/16/16 0500 12/16/16 1335  Weight: 62.9 kg (138 lb 11.2 oz) 65.8 kg (145 lb 1.6 oz) 63 kg (138 lb 14.2 oz)    Intake/Output: I/O last 3 completed shifts: In: 32 [P.O.:480; I.V.:3] Out: 200 [Urine:200]   Intake/Output this shift:  Total I/O In: 360 [P.O.:360] Out: 1500 [Other:1500]  Physical Exam: General: NAD, laying in the bed  Head: Normocephalic, atraumatic. Moist oral mucosal membranes, [place] oxygen  Eyes: Anicteric,   Neck: Supple,   Lungs:   Mild bilateral diffuse crackles  Heart: No rub, irregular bradycardic  Abdomen:  Soft, nontender,   Extremities:  Trace to 1+ peripheral edema.  Neurologic: Alert to self and place only  Skin: No acute lesions  Access: Left AVG,     Basic Metabolic Panel:  Recent Labs Lab 12/10/16 0327 12/11/16 1617 12/13/16 0330 12/14/16 1043 12/16/16 1048  NA 139 137 137 135 133*  K 3.3* 4.0 3.9 4.2  4.3  CL 97* 98* 97* 96* 95*  CO2 30 26 29 22 24   GLUCOSE 95 112* 129* 117* 128*  BUN 19 38* 35* 44* 44*  CREATININE 2.65* 3.89* 3.87* 4.54* 4.17*  CALCIUM 8.6* 8.5* 9.0 8.7* 8.6*  MG 1.8  --   --   --   --   PHOS  --  6.1*  --   --  5.6*    Liver Function Tests:  Recent Labs Lab 12/10/16 0327 12/11/16 1617 12/14/16 1043 12/16/16 1048  AST 28  --  46*  --   ALT 22  --  34  --   ALKPHOS 113  --  142*  --   BILITOT 1.0  --  1.0  --   PROT 6.2*  --  6.2*  --   ALBUMIN 3.4* 3.2* 3.2* 3.0*   No results for input(s): LIPASE, AMYLASE in the last 168 hours. No results for input(s): AMMONIA in the last 168 hours.  CBC:  Recent Labs Lab 12/10/16 0327 12/11/16 1617 12/13/16 0330 12/14/16 1043 12/16/16 1048  WBC 5.7 5.8 7.1 6.9 6.9  NEUTROABS 4.4  --   --  5.7  --   HGB 12.4 12.7 13.1 12.8 11.8*  HCT 37.7 38.4 39.5 39.1 35.8  MCV 98.7 98.4 98.7 97.9 97.3  PLT 99* 87* 107* 122* 130*    Cardiac Enzymes:  Recent Labs Lab 12/13/16 0615 12/14/16 1043 12/14/16 1449 12/14/16 1940 12/15/16 0145  TROPONINI 0.09* 0.11* 0.09* 0.10*  0.09*    BNP: Invalid input(s): POCBNP  CBG: No results for input(s): GLUCAP in the last 168 hours.  Microbiology: Results for orders placed or performed during the hospital encounter of 12/10/16  Blood culture (routine x 2)     Status: None   Collection Time: 12/10/16  4:15 AM  Result Value Ref Range Status   Specimen Description BLOOD RIGHT FOREARM  Final   Special Requests   Final    BOTTLES DRAWN AEROBIC AND ANAEROBIC Blood Culture adequate volume   Culture NO GROWTH 5 DAYS  Final   Report Status 12/15/2016 FINAL  Final  Blood culture (routine x 2)     Status: None   Collection Time: 12/10/16  4:22 AM  Result Value Ref Range Status   Specimen Description BLOOD RIGHT ANTECUBITAL  Final   Special Requests   Final    BOTTLES DRAWN AEROBIC AND ANAEROBIC Blood Culture adequate volume   Culture NO GROWTH 5 DAYS  Final   Report Status  12/15/2016 FINAL  Final  MRSA PCR Screening     Status: None   Collection Time: 12/10/16  6:34 AM  Result Value Ref Range Status   MRSA by PCR NEGATIVE NEGATIVE Final    Comment:        The GeneXpert MRSA Assay (FDA approved for NASAL specimens only), is one component of a comprehensive MRSA colonization surveillance program. It is not intended to diagnose MRSA infection nor to guide or monitor treatment for MRSA infections.     Coagulation Studies:  Recent Labs  12/14/16 1043 12/15/16 0145 12/16/16 0344  LABPROT 30.3* 34.3* 28.8*  INR 2.93 3.43 2.74    Urinalysis: No results for input(s): COLORURINE, LABSPEC, PHURINE, GLUCOSEU, HGBUR, BILIRUBINUR, KETONESUR, PROTEINUR, UROBILINOGEN, NITRITE, LEUKOCYTESUR in the last 72 hours.  Invalid input(s): APPERANCEUR    Imaging: Dg Chest 2 View  Result Date: 12/16/2016 CLINICAL DATA:  Pulmonary edema.  Known CHF. EXAM: CHEST  2 VIEW COMPARISON:  12/14/2016 FINDINGS: Stable cardiomegaly. Basilar chest densities particularly on the left side. Findings are suggestive for pleural fluid based on the lateral view. There may be focal consolidation or airspace disease at the left lung base. Interstitial edema has decreased or resolved. Atherosclerotic calcifications at the aortic arch. Left shoulder arthroplasty. IMPRESSION: Decreased interstitial edema. Persistent bibasilar chest densities. Findings are compatible with pleural effusions. There may be focal consolidation or airspace disease at left lung base. Electronically Signed   By: Markus Daft M.D.   On: 12/16/2016 11:23     Medications:       Assessment/ Plan:  [name] with hypertension, hypothyroidism, atrial fibrillation, hyperlipidemia, dementia, overactive bladder. First dialysis 08/24/15   TTS [facility] [place] [facility]  1. End Stage Renal Disease: TTS. patient presents with acute pulmonary edema.  -Patient seen during dialysis.   Tolerating well. Goal of fluid removal 1-1.5 kg as tolerated  2.  Acute pulmonary edema -Improved with fluid removal.  2.5 L were removed -Continue torsemide Clinically improved.  Remove fluid today as tolerated  3. Anemia of chronic kidney disease: hemoglobin 11.8 - Mircera as outpatient.  -We will hold Procrit for now  4. Secondary Hyperparathyroidism with hyperphosphatemia:   monitor phosphorus during hospital stay Currently 5.6   LOS: 2 Shekina Cordell 10/27/20184:15 PM

## 2016-12-16 NOTE — Progress Notes (Signed)
LCSW consulted with care manager and patient is to DC back to [facility] and daughter will be driving her back.  LCSW called [facility] to get room number and call report number  To Christy Sartorius [phone] Patient will be going to room 6  Discharge Summary sent via the [place] [facility] [place] signed and sent. LCSW consulted with patients Nurse  Enis Slipper LCSW [phone]

## 2016-12-16 NOTE — Progress Notes (Signed)
Pre hd info 

## 2016-12-16 NOTE — Care Management Note (Signed)
Case Management Note  Patient Details  Name: ARIANIS BOWDITCH MRN: [id number] Date of Birth: 1925-06-30  Subjective/Objective:   CSW Claudine [phone] or [phone]) was updated about [facility] having issues regarding Mrs Tennis's return there today. Claudine is calling [facility] to get an update about any issues with Mrs Scholes's discharge back to [facility] today.  Mrs Kneale has a portable oxygen tank delivered to her today by [facility]. Mrs Gavina is already open to [facility]. A referral for resumption of care (PT, RN, Aide) was called to Tim at [place] earlier today.                 Action/Plan:   Expected Discharge Date:  12/16/16               Expected Discharge Plan:  [place]  In-House Referral:  NA  Discharge planning Services  CM Consult  Post Acute Care Choice:  Durable Medical Equipment, Home Health Choice offered to:  Patient  DME Arranged:  Oxygen DME Agency:  [place] Arranged:  RN, PT, Nurse's Aide [place] Agency:  [facility])  Status of Service:  Completed, signed off  If discussed at [employer], dates discussed:    Additional Comments:  Alleyah Twombly A, RN 12/16/2016, 4:57 PM

## 2016-12-16 NOTE — Progress Notes (Signed)
Hd end 

## 2016-12-16 NOTE — Progress Notes (Addendum)
Dr. Anselm Jungling has ordered discharge. Patient's daughter is present, is aware, and had been previously updated on the possibility of discharge this morning. Case manager is aware and is arranging home health and home oxygen, and will notify social worker of discharge back to [facility]

## 2016-12-16 NOTE — Progress Notes (Signed)
ANTICOAGULATION CONSULT NOTE - FOLLOW UP  Pharmacy Consult for warfarin Indication: atrial fibrillation  Allergies  Allergen Reactions  . Penicillins Rash    Has patient had a PCN reaction causing immediate rash, facial/tongue/throat swelling, SOB or lightheadedness with hypotension: Yes Has patient had a PCN reaction causing severe rash involving mucus membranes or skin necrosis: No Has patient had a PCN reaction that required hospitalization No Has patient had a PCN reaction occurring within the last 10 years: No If all of the above answers are "NO", then may proceed with Cephalosporin use.     Patient Measurements: Height: 5\' 5"  (165.1 cm) Weight: 145 lb 1.6 oz (65.8 kg) IBW/kg (Calculated) : 57   Vital Signs: Temp: 97.6 F (36.4 C) (10/27 0754) Temp Source: Oral (10/27 0754) BP: 146/62 (10/27 0754) Pulse Rate: 69 (10/27 0754)  Labs:  Recent Labs  12/14/16 1043 12/14/16 1449 12/14/16 1940 12/15/16 0145 12/16/16 0344  HGB 12.8  --   --   --   --   HCT 39.1  --   --   --   --   PLT 122*  --   --   --   --   LABPROT 30.3*  --   --  34.3* 28.8*  INR 2.93  --   --  3.43 2.74  CREATININE 4.54*  --   --   --   --   TROPONINI 0.11* 0.09* 0.10* 0.09*  --     Estimated Creatinine Clearance: 7.6 mL/min (A) (by C-G formula based on SCr of 4.54 mg/dL (H)).  Assessment: 83 yo female on warfarin prior to admission for AFib. Pt with ESRD on HD admitted for acute exacerbation of CHF.  INR has been trending up steadily since previous admission earlier this week. Pt had been receiving 3 mg of warfarin during the previous admission. INR now borderline for being high. PTA med list notes dose of warfarin 4 mg daily. Pt off floor at dialysis currently.   DATE INR DOSE 10/25 2.93 2 mg 10/26 3.43 HELD 10/27   2.74     Goal of Therapy:  INR 2-3 Monitor platelets by anticoagulation protocol: Yes   Plan:  Will give warfarin 2 mg PO x 1 dose. Will recheck INR with am labs.    Carmon Brigandi D, Pharm.D., BCPS Clinical Pharmacist 12/16/2016,8:51 AM

## 2016-12-16 NOTE — Progress Notes (Signed)
Post hd assessment 

## 2016-12-16 NOTE — Care Management Note (Signed)
Case Management Note  Patient Details  Name: KODIE PICK MRN: [id number] Date of Birth: 05-23-1925  Subjective/Objective:     A referral for resumption of care was called to Corliss Blacker at [facility]. A referral for new oxygen was called to Melene Muller at [facility]. [facility] will deliver a portable 02 tank to Mrs [facility] room 204, and then set up home oxygen in her room at [facility]. Daughter will transport Mrs Palmar back to [facility].   Action/Plan:   Expected Discharge Date:  12/16/16               Expected Discharge Plan:  [place]  In-House Referral:  NA  Discharge planning Services  CM Consult  Post Acute Care Choice:  Durable Medical Equipment, Home Health Choice offered to:  Patient  DME Arranged:  Oxygen DME Agency:  [place] Arranged:  RN, PT, Nurse's Aide [place] Agency:  [facility] ([facility])  Status of Service:  Completed, signed off  If discussed at [employer], dates discussed:    Additional Comments:  Raney Antwine A, RN 12/16/2016, 3:18 PM

## 2016-12-16 NOTE — Discharge Summary (Signed)
[place] [facility] [place] NAME: Kerry Walsh    MR#:  [id number]  DATE OF BIRTH:  [birth year]  DATE OF ADMISSION:  12/14/2016 ADMITTING PHYSICIAN: Bettey Costa, MD  DATE OF DISCHARGE: 12/16/2016  PRIMARY CARE PHYSICIAN: [facility]    ADMISSION DIAGNOSIS:  Dyspnea, unspecified type [R06.00]  DISCHARGE DIAGNOSIS:  Active Problems:   Pulmonary edema  SECONDARY DIAGNOSIS:   Past Medical History:  Diagnosis Date  . Anemia   . Anxiety   . Arthritis   . Atrial fibrillation ([place])   . Cancer [facility])    Carcinoid Tumor  . CHF (congestive heart failure) ([place])   . Chronic kidney disease    CKD with last known creatinine fo 2.4 in Dec 2015  . Coronary artery disease   . Dementia   . Depression   . Dialysis patient [facility])    Mon. Wed. Fri.  . Dysrhythmia    Atrial Fibrillation  . Hypertension   . Hypothyroidism   . 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 Mitral valve regurgitation   . Osteoporosis   . Stroke [facility])    No residual neurological deficits    HOSPITAL COURSE:   83 year old female with history of combined systolic and diastolic heart failure, severe mitral regurgitation, atrial fibrillation currently on warfarin and end-stage renal disease on hemodialysis who presents with worsening shortness of breath and lower extremity edema.  1. Acute hypoxic respiratory failure in the setting of combined systolic and diastolic congestive heart failure Patient recently discharged from the hospital , need oxygen on discharge  Inspite of doing HD and removing fluid, she continue to stay hypoxic with exertion with PT. Ordered O2 on d/c  2. Combined systolic and diastolic heart failure with ejection fraction of 45% and severe mitral regurgitation by echocardiogram in July Cardiology consult appreciated. Hemodialysis done, better now.  3. ESRD on hemodialysis: patient seen by  nephrology  4. Atrial fibrillation, chronic: Coumadin as per pharmacy consultation Continue metoprolol for heart rate control  5. Essential hypertension: Continue metoprolol and Norvasc  6. Hypothyroidism: Continue Synthroid   DISCHARGE CONDITIONS:   Stable.  CONSULTS OBTAINED:  Treatment Team:  Murlean Iba, MD Yolonda Kida, MD  DRUG ALLERGIES:   Allergies  Allergen Reactions  . Penicillins Rash    Has patient had a PCN reaction causing immediate rash, facial/tongue/throat swelling, SOB or lightheadedness with hypotension: Yes Has patient had a PCN reaction causing severe rash involving mucus membranes or skin necrosis: No Has patient had a PCN reaction that required hospitalization No Has patient had a PCN reaction occurring within the last 10 years: No If all of the above answers are "NO", then may proceed with Cephalosporin use.     DISCHARGE MEDICATIONS:   Current Discharge Medication List    CONTINUE these medications which have CHANGED   Details  warfarin (COUMADIN) 2 MG tablet Take 1 tablet (2 mg total) by mouth daily at 6 PM. Qty: 20 tablet, Refills: 0      CONTINUE these medications which have NOT CHANGED   Details  amLODipine (NORVASC) 5 MG tablet Take 5 mg by mouth every morning.     calcitRIOL (ROCALTROL) 0.25 MCG capsule Take 0.25 mcg by mouth daily.    Colloidal Oatmeal (EUCERIN ECZEMA RELIEF EX) Apply 1 application topically 2 (two) times daily. Apply to feet and legs    levothyroxine (SYNTHROID, LEVOTHROID) 88 MCG tablet Take 88 mcg by mouth daily  before breakfast.    Associated Diagnoses: Carcinoid tumor of ovary, malignant ([name])    lidocaine (LIDODERM) 5 % Place 1 patch onto the skin daily. Remove & Discard patch within 12 hours or as directed by MD Qty: 10 patch, Refills: 0    metoprolol (LOPRESSOR) 50 MG tablet Take 1 tablet (50 mg total) by mouth 2 (two) times daily. Qty: 60 tablet, Refills: 0    torsemide (DEMADEX) 20 MG  tablet Take 2 tablets (40 mg total) by mouth daily. Qty: 60 tablet, Refills: 0    acetaminophen (TYLENOL) 325 MG tablet Take 650 mg by mouth 4 (four) times daily as needed for mild pain or moderate pain.    traMADol (ULTRAM) 50 MG tablet Take 50 mg by mouth every 4 (four) hours as needed for moderate pain.    Wound Dressings (GELOCAST UNNAS BOOT) MISC Apply 1 application topically every 3 (three) days. Apply to bilateral legs         DISCHARGE INSTRUCTIONS:   Follow with PMD in 1-2 weeks.  If you experience worsening of your admission symptoms, develop shortness of breath, life threatening emergency, suicidal or homicidal thoughts you must seek medical attention immediately by calling 911 or calling your MD immediately  if symptoms less severe.  You Must read complete instructions/literature along with all the possible adverse reactions/side effects for all the Medicines you take and that have been prescribed to you. Take any new Medicines after you have completely understood and accept all the possible adverse reactions/side effects.   Please note  You were cared for by a hospitalist during your hospital stay. If you have any questions about your discharge medications or the care you received while you were in the hospital after you are discharged, you can call the unit and asked to speak with the hospitalist on call if the hospitalist that took care of you is not available. Once you are discharged, your primary care physician will handle any further medical issues. Please note that NO REFILLS for any discharge medications will be authorized once you are discharged, as it is imperative that you return to your primary care physician (or establish a relationship with a primary care physician if you do not have one) for your aftercare needs so that they can reassess your need for medications and monitor your lab values.    Today   CHIEF COMPLAINT:   Chief Complaint  Patient presents  with  . Shortness of Breath    HISTORY OF PRESENT ILLNESS:  Kerry Walsh  is a 83 y.o. female with a known history of end-stage renal disease on hemodialysis, chronic combined systolic and diastolic heart failure with ejection fraction 45% and severe mitral regurgitation, essential hypertension who presents to the emergency room due to increasing shortness of breath. Patient was discharged from the hospital a few days ago due to shortness of breath from severe mitral regurgitation and pulmonary edema. She is brought back to the hospital for ongoing symptoms. She denies PND, orthopnea, but has increased LEE. Chest x-ray today shows mild interstitial edema. She was given 60 mg IV of Lasix. Shortness of breath is somewhat improved.  She has dialysis on Tuesday, Thursday and Saturday. She received dialysis on Tuesday. She is supposed to have dialysis today but she did not go today. Her oxygen level was 87% at [place] when EMS arrived.   VITAL SIGNS:  Blood pressure 135/63, pulse 72, temperature 97.8 F (36.6 C), temperature source Oral, resp. rate (!) 22, height  5\' 5"  (1.651 m), weight 63 kg (138 lb 14.2 oz), SpO2 98 %.  I/O:   Intake/Output Summary (Last 24 hours) at 12/16/16 1457 Last data filed at 12/16/16 1335  Gross per 24 hour  Intake              600 ml  Output             1700 ml  Net            -1100 ml    PHYSICAL EXAMINATION:  GENERAL: Pleasant-appearing in no apparent distress.  HEAD, EYES, EARS, NOSE AND THROAT: Atraumatic, normocephalic. Extraocular muscles are intact. Pupils equal and reactive to light. Sclerae anicteric. No conjunctival injection. No oro-pharyngeal erythema.  NECK: Supple. There is no jugular venous distention. No bruits, no lymphadenopathy, no thyromegaly.  HEART: Regular rate and rhythm,. No murmurs, no rubs, no clicks.  LUNGS: crackles at the bases no associated muscle usage  ABDOMEN: Soft, flat, nontender, nondistended. Has good bowel sounds.  No hepatosplenomegaly appreciated.  EXTREMITIES: No evidence of any cyanosis, clubbing, or peripheral edema.  +2 pedal and radial pulses bilaterally.  NEUROLOGIC: The patient is alert, awake, and oriented x3 with no focal motor or sensory deficits appreciated bilaterally.  SKIN: Moist and warm with no rashes appreciated.  Psych: Not anxious, depressed LN: No inguinal LN enlargement   DATA REVIEW:   CBC  Recent Labs Lab 12/16/16 1048  WBC 6.9  HGB 11.8*  HCT 35.8  PLT 130*    Chemistries   Recent Labs Lab 12/10/16 0327  12/14/16 1043 12/16/16 1048  NA 139  < > 135 133*  K 3.3*  < > 4.2 4.3  CL 97*  < > 96* 95*  CO2 30  < > 22 24  GLUCOSE 95  < > 117* 128*  BUN 19  < > 44* 44*  CREATININE 2.65*  < > 4.54* 4.17*  CALCIUM 8.6*  < > 8.7* 8.6*  MG 1.8  --   --   --   AST 28  --  46*  --   ALT 22  --  34  --   ALKPHOS 113  --  142*  --   BILITOT 1.0  --  1.0  --   < > = values in this interval not displayed.  Cardiac Enzymes  Recent Labs Lab 12/15/16 0145  TROPONINI 0.09*    Microbiology Results  Results for orders placed or performed during the hospital encounter of 12/10/16  Blood culture (routine x 2)     Status: None   Collection Time: 12/10/16  4:15 AM  Result Value Ref Range Status   Specimen Description BLOOD RIGHT FOREARM  Final   Special Requests   Final    BOTTLES DRAWN AEROBIC AND ANAEROBIC Blood Culture adequate volume   Culture NO GROWTH 5 DAYS  Final   Report Status 12/15/2016 FINAL  Final  Blood culture (routine x 2)     Status: None   Collection Time: 12/10/16  4:22 AM  Result Value Ref Range Status   Specimen Description BLOOD RIGHT ANTECUBITAL  Final   Special Requests   Final    BOTTLES DRAWN AEROBIC AND ANAEROBIC Blood Culture adequate volume   Culture NO GROWTH 5 DAYS  Final   Report Status 12/15/2016 FINAL  Final  MRSA PCR Screening     Status: None   Collection Time: 12/10/16  6:34 AM  Result Value Ref Range Status   MRSA by PCR  NEGATIVE NEGATIVE Final    Comment:        The GeneXpert MRSA Assay (FDA approved for NASAL specimens only), is one component of a comprehensive MRSA colonization surveillance program. It is not intended to diagnose MRSA infection nor to guide or monitor treatment for MRSA infections.     RADIOLOGY:  Dg Chest 2 View  Result Date: 12/16/2016 CLINICAL DATA:  Pulmonary edema.  Known CHF. EXAM: CHEST  2 VIEW COMPARISON:  12/14/2016 FINDINGS: Stable cardiomegaly. Basilar chest densities particularly on the left side. Findings are suggestive for pleural fluid based on the lateral view. There may be focal consolidation or airspace disease at the left lung base. Interstitial edema has decreased or resolved. Atherosclerotic calcifications at the aortic arch. Left shoulder arthroplasty. IMPRESSION: Decreased interstitial edema. Persistent bibasilar chest densities. Findings are compatible with pleural effusions. There may be focal consolidation or airspace disease at left lung base. Electronically Signed   By: Markus Daft M.D.   On: 12/16/2016 11:23    EKG:   Orders placed or performed during the hospital encounter of 12/14/16  . ED EKG  . ED EKG      Management plans discussed with the patient, family and they are in agreement.  CODE STATUS:     Code Status Orders        Start     Ordered   12/14/16 1341  Do not attempt resuscitation (DNR)  Continuous    Question Answer Comment  In the event of cardiac or respiratory ARREST Do not call a "code blue"   In the event of cardiac or respiratory ARREST Do not perform Intubation, CPR, defibrillation or ACLS   In the event of cardiac or respiratory ARREST Use medication by any route, position, wound care, and other measures to relive pain and suffering. May use oxygen, suction and manual treatment of airway obstruction as needed for comfort.      12/14/16 1340    Code Status History    Date Active Date Inactive Code Status Order ID  Comments User Context   12/10/2016  6:53 AM 12/12/2016  4:23 PM Full Code [id number]  Harrie Foreman, MD Inpatient   09/26/2014 12:19 AM 09/26/2014  3:48 PM Full Code [id number]  Lance Coon, MD Inpatient   06/28/2014  5:47 PM 06/29/2014  2:40 PM Full Code [id number]  Gladstone Lighter, MD Inpatient    Advance Directive Documentation     Most Recent Value  Type of Advance Directive  Healthcare Power of Attorney  Pre-existing out of facility DNR order (yellow form or pink MOST form)  -  "MOST" Form in Place?  -      TOTAL TIME TAKING CARE OF THIS PATIENT: 35 minutes.    Vaughan Basta M.D on 12/16/2016 at 2:57 PM  Between 7am to 6pm - Pager - [phone]  After 6pm go to [email or web address] - password [email or web address] [place] [facility]  Office  [phone]  CC: Primary care physician; [facility]   Note: This dictation was prepared with Dragon dictation along with smaller phrase technology. Any transcriptional errors that result from this process are unintentional.

## 2016-12-17 LAB — PROTIME-INR
INR: 2.29
Prothrombin Time: 25 seconds — ABNORMAL HIGH (ref 11.4–15.2)

## 2016-12-17 MED ORDER — LORAZEPAM 2 MG/ML IJ SOLN
0.5000 mg | Freq: Once | INTRAMUSCULAR | Status: AC
  Administered 2016-12-17: 0.5 mg via INTRAVENOUS
  Filled 2016-12-17: qty 1

## 2016-12-17 MED ORDER — WARFARIN SODIUM 2 MG PO TABS
2.0000 mg | ORAL_TABLET | Freq: Once | ORAL | Status: AC
  Administered 2016-12-17: 2 mg via ORAL
  Filled 2016-12-17: qty 1

## 2016-12-17 NOTE — Progress Notes (Signed)
ANTICOAGULATION CONSULT NOTE - FOLLOW UP  Pharmacy Consult for warfarin Indication: atrial fibrillation  Allergies  Allergen Reactions  . Penicillins Rash    Has patient had a PCN reaction causing immediate rash, facial/tongue/throat swelling, SOB or lightheadedness with hypotension: Yes Has patient had a PCN reaction causing severe rash involving mucus membranes or skin necrosis: No Has patient had a PCN reaction that required hospitalization No Has patient had a PCN reaction occurring within the last 10 years: No If all of the above answers are "NO", then may proceed with Cephalosporin use.    Patient Measurements: Height: 5\' 5"  (165.1 cm) Weight: 139 lb 15.9 oz (63.5 kg) IBW/kg (Calculated) : 57  Vital Signs: Temp: 98.2 F (36.8 C) (10/28 0413) Temp Source: Oral (10/28 0413) BP: 126/49 (10/28 0413) Pulse Rate: 70 (10/28 0413)  Labs:  Recent Labs  12/14/16 1043 12/14/16 1449 12/14/16 1940 12/15/16 0145 12/16/16 0344 12/16/16 1048 12/17/16 0401  HGB 12.8  --   --   --   --  11.8*  --   HCT 39.1  --   --   --   --  35.8  --   PLT 122*  --   --   --   --  130*  --   LABPROT 30.3*  --   --  34.3* 28.8*  --  25.0*  INR 2.93  --   --  3.43 2.74  --  2.29  CREATININE 4.54*  --   --   --   --  4.17*  --   TROPONINI 0.11* 0.09* 0.10* 0.09*  --   --   --     Estimated Creatinine Clearance: 8.2 mL/min (A) (by C-G formula based on SCr of 4.17 mg/dL (H)).  Assessment: 83 yo female on warfarin prior to admission for AFib. Pt with ESRD on HD admitted for acute exacerbation of CHF.  INR has been trending up steadily since previous admission earlier this week. Pt had been receiving 3 mg of warfarin during the previous admission. INR now borderline for being high. PTA med list notes dose of warfarin 4 mg daily.  DATE INR DOSE 10/25 2.93 2 mg 10/26 3.43 HELD 10/27   2.74   2 mg 10/28 2.29   Goal of Therapy:  INR 2-3 Monitor platelets by anticoagulation protocol: Yes    Plan:  INR remains therapeutic but has decreased, likely a reflection of dose being held on 10/26. Will give another warfarin 2 mg PO this evening.  Lenis Noon, Pharm.D., BCPS Clinical Pharmacist 12/17/2016,7:32 AM

## 2016-12-17 NOTE — Progress Notes (Signed)
PRE HD TX 

## 2016-12-17 NOTE — Progress Notes (Signed)
HD TX ended  

## 2016-12-17 NOTE — Progress Notes (Signed)
[place] at [facility]                                                                                                                                                                                  Patient Demographics   Kerry Walsh, is a 83 y.o. female, DOB - 1925/06/28, [id number]  Admit date - 12/14/2016   Admitting Physician Bettey Costa, MD  Outpatient Primary MD for the patient is [facility]   LOS - 3  Subjective: Patient's shortness of breath improved Having HD today. Tried to d/c , but her facility can not take her until Monday.   She is fine.    Review of Systems:   CONSTITUTIONAL: unable to provide due to her mental status  Vitals:   Vitals:   12/17/16 0413 12/17/16 0500 12/17/16 0940 12/17/16 1423  BP: (!) 126/49  (!) 142/55 (!) 134/54  Pulse: 70  71 63  Resp: 20  17   Temp: 98.2 F (36.8 C)   97.9 F (36.6 C)  TempSrc: Oral   Oral  SpO2: 96%  98% 98%  Weight: 63.7 kg (140 lb 6.9 oz) 63.5 kg (139 lb 15.9 oz)    Height:        Wt Readings from Last 3 Encounters:  12/17/16 63.5 kg (139 lb 15.9 oz)  12/13/16 82.1 kg (181 lb)  12/12/16 82.4 kg (181 lb 9.6 oz)     Intake/Output Summary (Last 24 hours) at 12/17/16 1454 Last data filed at 12/17/16 1346  Gross per 24 hour  Intake              600 ml  Output                0 ml  Net              600 ml    Physical Exam:   GENERAL: Pleasant-appearing in no apparent distress.  HEAD, EYES, EARS, NOSE AND THROAT: Atraumatic, normocephalic. Extraocular muscles are intact. Pupils equal and reactive to light. Sclerae anicteric. No conjunctival injection. No oro-pharyngeal erythema.  NECK: Supple. There is no jugular venous distention. No bruits, no lymphadenopathy, no thyromegaly.  HEART: Regular rate and rhythm,. No murmurs, no rubs, no clicks.  LUNGS: crackles at the bases no associated muscle usage  ABDOMEN: Soft, flat, nontender, nondistended. Has good  bowel sounds. No hepatosplenomegaly appreciated.  EXTREMITIES: No evidence of any cyanosis, clubbing, or peripheral edema.  +2 pedal and radial pulses bilaterally.  NEUROLOGIC: The patient is alert, awake, and oriented x3 with no focal motor or sensory deficits appreciated bilaterally.  SKIN: Moist and warm with no rashes appreciated.  Psych: Not anxious, depressed LN: No inguinal LN enlargement.    Antibiotics   Anti-infectives    None      Medications   Scheduled Meds: . amLODipine  5 mg Oral BH-q7a  . calcitRIOL  0.25 mcg Oral Daily  . levothyroxine  88 mcg Oral QAC breakfast  . metoprolol tartrate  50 mg Oral BID  . sodium chloride flush  3 mL Intravenous Q12H  . torsemide  40 mg Oral Daily  . warfarin  2 mg Oral ONCE-1800  . Warfarin - Pharmacist Dosing Inpatient   Does not apply q1800   Continuous Infusions: . sodium chloride     PRN Meds:.sodium chloride, acetaminophen **OR** acetaminophen, bisacodyl, guaiFENesin-dextromethorphan, HYDROcodone-acetaminophen, ondansetron **OR** ondansetron (ZOFRAN) IV, senna-docusate, sodium chloride flush, traMADol   Data Review:   Micro Results Recent Results (from the past 240 hour(s))  Blood culture (routine x 2)     Status: None   Collection Time: 12/10/16  4:15 AM  Result Value Ref Range Status   Specimen Description BLOOD RIGHT FOREARM  Final   Special Requests   Final    BOTTLES DRAWN AEROBIC AND ANAEROBIC Blood Culture adequate volume   Culture NO GROWTH 5 DAYS  Final   Report Status 12/15/2016 FINAL  Final  Blood culture (routine x 2)     Status: None   Collection Time: 12/10/16  4:22 AM  Result Value Ref Range Status   Specimen Description BLOOD RIGHT ANTECUBITAL  Final   Special Requests   Final    BOTTLES DRAWN AEROBIC AND ANAEROBIC Blood Culture adequate volume   Culture NO GROWTH 5 DAYS  Final   Report Status 12/15/2016 FINAL  Final  MRSA PCR Screening     Status: None   Collection Time: 12/10/16  6:34 AM   Result Value Ref Range Status   MRSA by PCR NEGATIVE NEGATIVE Final    Comment:        The GeneXpert MRSA Assay (FDA approved for NASAL specimens only), is one component of a comprehensive MRSA colonization surveillance program. It is not intended to diagnose MRSA infection nor to guide or monitor treatment for MRSA infections.     Radiology Reports Dg Chest 2 View  Result Date: 12/16/2016 CLINICAL DATA:  Pulmonary edema.  Known CHF. EXAM: CHEST  2 VIEW COMPARISON:  12/14/2016 FINDINGS: Stable cardiomegaly. Basilar chest densities particularly on the left side. Findings are suggestive for pleural fluid based on the lateral view. There may be focal consolidation or airspace disease at the left lung base. Interstitial edema has decreased or resolved. Atherosclerotic calcifications at the aortic arch. Left shoulder arthroplasty. IMPRESSION: Decreased interstitial edema. Persistent bibasilar chest densities. Findings are compatible with pleural effusions. There may be focal consolidation or airspace disease at left lung base. Electronically Signed   By: Markus Daft M.D.   On: 12/16/2016 11:23   Dg Chest 2 View  Result Date: 12/14/2016 CLINICAL DATA:  Shortness of breath. EXAM: CHEST  2 VIEW COMPARISON:  12/10/2016 FINDINGS: The cardiac silhouette remains enlarged. Aortic atherosclerotic cysts is noted. The lungs are hypoinflated, slightly more so than on the prior study. There is central pulmonary vascular congestion with diffuse interstitial densities bilaterally which have mildly increased. Small pleural effusions are present. No pneumothorax is seen. A left shoulder arthroplasty is again noted. IMPRESSION: Mild worsening of interstitial edema.  Small pleural effusions. Electronically Signed   By: Logan Bores M.D.   On: 12/14/2016 11:11   Dg Thoracic Spine 2 View  Result Date: 12/13/2016 CLINICAL DATA:  Acute onset of upper back pain.  Initial encounter. EXAM: THORACIC SPINE 2 VIEWS  COMPARISON:  Chest radiograph performed 07/16/2015 FINDINGS: There is no evidence of fracture or subluxation. Vertebral bodies demonstrate normal height and alignment. Intervertebral disc spaces are preserved. The visualized portions of both lungs are clear. The mediastinum is unremarkable in appearance. Scattered calcification is seen along the thoracic and proximal abdominal aorta. IMPRESSION: 1. No evidence of fracture or subluxation along the thoracic spine. 2. Scattered aortic atherosclerosis. Electronically Signed   By: Garald Balding M.D.   On: 12/13/2016 05:34   Dg Lumbar Spine Complete  Result Date: 12/13/2016 CLINICAL DATA:  Acute onset of right flank pain.  Initial encounter. EXAM: LUMBAR SPINE - COMPLETE 4+ VIEW COMPARISON:  CT of the abdomen and pelvis from 09/14/2016 FINDINGS: There is no evidence of fracture or subluxation. The patient is status post lumbar spinal fusion at L3-S1. Vertebral bodies demonstrate normal height and alignment. Intervertebral disc spaces are grossly preserved. The visualized bowel gas pattern is unremarkable in appearance; air and stool are noted within the colon. The sacroiliac joints are within normal limits. Scattered calcification is seen along the abdominal aorta. IMPRESSION: 1. No evidence of fracture or subluxation along the lumbar spine. Status post lumbosacral spinal fusion at L3-S1. 2. Scattered aortic atherosclerosis. Electronically Signed   By: Garald Balding M.D.   On: 12/13/2016 05:33   Dg Chest Port 1 View  Result Date: 12/10/2016 CLINICAL DATA:  Acute dyspnea EXAM: PORTABLE CHEST 1 VIEW COMPARISON:  07/16/2015 FINDINGS: Stable cardiomegaly with thoracic aortic atherosclerosis. Central vascular congestion with interstitial edema is noted. Trace bilateral pleural effusions with slight blunting of the costophrenic angles. Left shoulder arthroplasty appears intact. Osteoarthritic native right glenohumeral joint. IMPRESSION: Stable cardiomegaly with  aortic atherosclerosis. Mild central vascular congestion with interstitial edema, increased relative to prior comparison. Electronically Signed   By: Ashley Royalty M.D.   On: 12/10/2016 03:42     CBC  Recent Labs Lab 12/11/16 1617 12/13/16 0330 12/14/16 1043 12/16/16 1048  WBC 5.8 7.1 6.9 6.9  HGB 12.7 13.1 12.8 11.8*  HCT 38.4 39.5 39.1 35.8  PLT 87* 107* 122* 130*  MCV 98.4 98.7 97.9 97.3  MCH 32.4 32.8 32.1 32.1  MCHC 32.9 33.3 32.8 33.0  RDW 14.8* 14.9* 14.8* 14.6*  LYMPHSABS  --   --  0.6*  --   MONOABS  --   --  0.5  --   EOSABS  --   --  0.0  --   BASOSABS  --   --  0.1  --     Chemistries   Recent Labs Lab 12/11/16 1617 12/13/16 0330 12/14/16 1043 12/16/16 1048  NA 137 137 135 133*  K 4.0 3.9 4.2 4.3  CL 98* 97* 96* 95*  CO2 26 29 22 24   GLUCOSE 112* 129* 117* 128*  BUN 38* 35* 44* 44*  CREATININE 3.89* 3.87* 4.54* 4.17*  CALCIUM 8.5* 9.0 8.7* 8.6*  AST  --   --  46*  --   ALT  --   --  34  --   ALKPHOS  --   --  142*  --   BILITOT  --   --  1.0  --    ------------------------------------------------------------------------------------------------------------------ estimated creatinine clearance is 8.2 mL/min (A) (by C-G formula based on SCr of 4.17 mg/dL (H)). ------------------------------------------------------------------------------------------------------------------ No results for input(s): HGBA1C in the last 72 hours. ------------------------------------------------------------------------------------------------------------------ No results for input(s): CHOL, HDL,  LDLCALC, TRIG, CHOLHDL, LDLDIRECT in the last 72 hours. ------------------------------------------------------------------------------------------------------------------ No results for input(s): TSH, T4TOTAL, T3FREE, THYROIDAB in the last 72 hours.  Invalid input(s):  FREET3 ------------------------------------------------------------------------------------------------------------------ No results for input(s): VITAMINB12, FOLATE, FERRITIN, TIBC, IRON, RETICCTPCT in the last 72 hours.  Coagulation profile  Recent Labs Lab 12/12/16 0554 12/14/16 1043 12/15/16 0145 12/16/16 0344 12/17/16 0401  INR 2.55 2.93 3.43 2.74 2.29    No results for input(s): DDIMER in the last 72 hours.  Cardiac Enzymes  Recent Labs Lab 12/14/16 1449 12/14/16 1940 12/15/16 0145  TROPONINI 0.09* 0.10* 0.09*   ------------------------------------------------------------------------------------------------------------------ Invalid input(s): POCBNP    Assessment & Plan   83 year old female with history of combined systolic and diastolic heart failure, severe mitral regurgitation, atrial fibrillation currently on warfarin and end-stage renal disease on hemodialysis who presents with worsening shortness of breath and lower extremity edema.  1. Acute hypoxic respiratory failure in the setting of combined systolic and diastolic congestive heart failure Patient recently discharged from the hospital may need oxygen on discharge  Arranged fro d/c plan today , but facility is not ready over the weekend.   2. Combined systolic and diastolic heart failure with ejection fraction of 45% and severe mitral regurgitation by echocardiogram in July Cardiology consult appreciated. Hemodialysis Done. Pt stable.  3. ESRD on hemodialysis: patient seen by nephrology  4. Atrial fibrillation, chronic: Coumadin as per pharmacy consultation Continue metoprolol for heart rate control  5. Essential hypertension: Continue metoprolol and Norvasc   6. Hypothyroidism: Continue Synthroid       Code Status Orders        Start     Ordered   12/14/16 1341  Do not attempt resuscitation (DNR)  Continuous    Question Answer Comment  In the event of cardiac or respiratory  ARREST Do not call a "code blue"   In the event of cardiac or respiratory ARREST Do not perform Intubation, CPR, defibrillation or ACLS   In the event of cardiac or respiratory ARREST Use medication by any route, position, wound care, and other measures to relive pain and suffering. May use oxygen, suction and manual treatment of airway obstruction as needed for comfort.      12/14/16 1340    Code Status History    Date Active Date Inactive Code Status Order ID Comments User Context   12/10/2016  6:53 AM 12/12/2016  4:23 PM Full Code [id number]  Harrie Foreman, MD Inpatient   09/26/2014 12:19 AM 09/26/2014  3:48 PM Full Code [id number]  Lance Coon, MD Inpatient   06/28/2014  5:47 PM 06/29/2014  2:40 PM Full Code [id number]  Gladstone Lighter, MD Inpatient    Advance Directive Documentation     Most Recent Value  Type of Advance Directive  Healthcare Power of Attorney  Pre-existing out of facility DNR order (yellow form or pink MOST form)  -  "MOST" Form in Place?  -       [facility], nephrology  DVT Prophylaxis Coumadin  Lab Results  Component Value Date   PLT 130 (L) 12/16/2016     Time Spent in minutes  35 minutes Greater than 50% of time spent in care coordination and counseling patient regarding the condition and plan of care.   Vaughan Basta M.D on 12/17/2016 at 2:54 PM  Between 7am to 6pm - Pager - [phone]  After 6pm go to [email or web address] - password [email or web address]   Office  [phone]

## 2016-12-17 NOTE — Progress Notes (Signed)
[place] [facility]                                                                                                                                                                                  Patient Demographics   Kerry Walsh, is a 83 y.o. female, DOB - June 07, 1925, HDQ:[id number]  Admit date - 12/14/2016   Admitting Physician Bettey Costa, MD  Outpatient Primary MD for the patient is [facility]   LOS - 3  Subjective: Patient's shortness of breath improved Having HD today. Tried to d/c after HD, but her facility can not take her until Monday.    Review of Systems:   CONSTITUTIONAL: unable to provide due to her mental status  Vitals:   Vitals:   12/16/16 2014 12/17/16 0203 12/17/16 0413 12/17/16 0500  BP: 133/65 136/71 (!) 126/49   Pulse: 73 72 70   Resp: 18 20 20    Temp: 98.5 F (36.9 C)  98.2 F (36.8 C)   TempSrc: Oral  Oral   SpO2: 95% 95% 96%   Weight:   63.7 kg (140 lb 6.9 oz) 63.5 kg (139 lb 15.9 oz)  Height:        Wt Readings from Last 3 Encounters:  12/17/16 63.5 kg (139 lb 15.9 oz)  12/13/16 82.1 kg (181 lb)  12/12/16 82.4 kg (181 lb 9.6 oz)     Intake/Output Summary (Last 24 hours) at 12/17/16 0717 Last data filed at 12/16/16 2000  Gross per 24 hour  Intake              600 ml  Output             1500 ml  Net             -900 ml    Physical Exam:   GENERAL: Pleasant-appearing in no apparent distress.  HEAD, EYES, EARS, NOSE AND THROAT: Atraumatic, normocephalic. Extraocular muscles are intact. Pupils equal and reactive to light. Sclerae anicteric. No conjunctival injection. No oro-pharyngeal erythema.  NECK: Supple. There is no jugular venous distention. No bruits, no lymphadenopathy, no thyromegaly.  HEART: Regular rate and rhythm,. No murmurs, no rubs, no clicks.  LUNGS: crackles at the bases no associated muscle usage  ABDOMEN: Soft, flat, nontender, nondistended. Has good bowel sounds.  No hepatosplenomegaly appreciated.  EXTREMITIES: No evidence of any cyanosis, clubbing, or peripheral edema.  +2 pedal and radial pulses bilaterally.  NEUROLOGIC: The patient is alert, awake, and oriented x3 with no focal motor or sensory deficits appreciated bilaterally.  SKIN: Moist and warm with no rashes appreciated.  Psych: Not anxious, depressed LN: No inguinal LN enlargement  Antibiotics   Anti-infectives    None      Medications   Scheduled Meds: . amLODipine  5 mg Oral BH-q7a  . calcitRIOL  0.25 mcg Oral Daily  . levothyroxine  88 mcg Oral QAC breakfast  . metoprolol tartrate  50 mg Oral BID  . sodium chloride flush  3 mL Intravenous Q12H  . torsemide  40 mg Oral Daily  . Warfarin - Pharmacist Dosing Inpatient   Does not apply q1800   Continuous Infusions: . sodium chloride     PRN Meds:.sodium chloride, acetaminophen **OR** acetaminophen, bisacodyl, guaiFENesin-dextromethorphan, HYDROcodone-acetaminophen, ondansetron **OR** ondansetron (ZOFRAN) IV, senna-docusate, sodium chloride flush, traMADol   Data Review:   Micro Results Recent Results (from the past 240 hour(s))  Blood culture (routine x 2)     Status: None   Collection Time: 12/10/16  4:15 AM  Result Value Ref Range Status   Specimen Description BLOOD RIGHT FOREARM  Final   Special Requests   Final    BOTTLES DRAWN AEROBIC AND ANAEROBIC Blood Culture adequate volume   Culture NO GROWTH 5 DAYS  Final   Report Status 12/15/2016 FINAL  Final  Blood culture (routine x 2)     Status: None   Collection Time: 12/10/16  4:22 AM  Result Value Ref Range Status   Specimen Description BLOOD RIGHT ANTECUBITAL  Final   Special Requests   Final    BOTTLES DRAWN AEROBIC AND ANAEROBIC Blood Culture adequate volume   Culture NO GROWTH 5 DAYS  Final   Report Status 12/15/2016 FINAL  Final  MRSA PCR Screening     Status: None   Collection Time: 12/10/16  6:34 AM  Result Value Ref Range Status   MRSA by PCR  NEGATIVE NEGATIVE Final    Comment:        The GeneXpert MRSA Assay (FDA approved for NASAL specimens only), is one component of a comprehensive MRSA colonization surveillance program. It is not intended to diagnose MRSA infection nor to guide or monitor treatment for MRSA infections.     Radiology Reports Dg Chest 2 View  Result Date: 12/16/2016 CLINICAL DATA:  Pulmonary edema.  Known CHF. EXAM: CHEST  2 VIEW COMPARISON:  12/14/2016 FINDINGS: Stable cardiomegaly. Basilar chest densities particularly on the left side. Findings are suggestive for pleural fluid based on the lateral view. There may be focal consolidation or airspace disease at the left lung base. Interstitial edema has decreased or resolved. Atherosclerotic calcifications at the aortic arch. Left shoulder arthroplasty. IMPRESSION: Decreased interstitial edema. Persistent bibasilar chest densities. Findings are compatible with pleural effusions. There may be focal consolidation or airspace disease at left lung base. Electronically Signed   By: Markus Daft M.D.   On: 12/16/2016 11:23   Dg Chest 2 View  Result Date: 12/14/2016 CLINICAL DATA:  Shortness of breath. EXAM: CHEST  2 VIEW COMPARISON:  12/10/2016 FINDINGS: The cardiac silhouette remains enlarged. Aortic atherosclerotic cysts is noted. The lungs are hypoinflated, slightly more so than on the prior study. There is central pulmonary vascular congestion with diffuse interstitial densities bilaterally which have mildly increased. Small pleural effusions are present. No pneumothorax is seen. A left shoulder arthroplasty is again noted. IMPRESSION: Mild worsening of interstitial edema.  Small pleural effusions. Electronically Signed   By: Logan Bores M.D.   On: 12/14/2016 11:11   Dg Thoracic Spine 2 View  Result Date: 12/13/2016 CLINICAL DATA:  Acute onset of upper back pain.  Initial encounter. EXAM: THORACIC SPINE 2 VIEWS  COMPARISON:  Chest radiograph performed 07/16/2015  FINDINGS: There is no evidence of fracture or subluxation. Vertebral bodies demonstrate normal height and alignment. Intervertebral disc spaces are preserved. The visualized portions of both lungs are clear. The mediastinum is unremarkable in appearance. Scattered calcification is seen along the thoracic and proximal abdominal aorta. IMPRESSION: 1. No evidence of fracture or subluxation along the thoracic spine. 2. Scattered aortic atherosclerosis. Electronically Signed   By: Garald Balding M.D.   On: 12/13/2016 05:34   Dg Lumbar Spine Complete  Result Date: 12/13/2016 CLINICAL DATA:  Acute onset of right flank pain.  Initial encounter. EXAM: LUMBAR SPINE - COMPLETE 4+ VIEW COMPARISON:  CT of the abdomen and pelvis from 09/14/2016 FINDINGS: There is no evidence of fracture or subluxation. The patient is status post lumbar spinal fusion at L3-S1. Vertebral bodies demonstrate normal height and alignment. Intervertebral disc spaces are grossly preserved. The visualized bowel gas pattern is unremarkable in appearance; air and stool are noted within the colon. The sacroiliac joints are within normal limits. Scattered calcification is seen along the abdominal aorta. IMPRESSION: 1. No evidence of fracture or subluxation along the lumbar spine. Status post lumbosacral spinal fusion at L3-S1. 2. Scattered aortic atherosclerosis. Electronically Signed   By: Garald Balding M.D.   On: 12/13/2016 05:33   Dg Chest Port 1 View  Result Date: 12/10/2016 CLINICAL DATA:  Acute dyspnea EXAM: PORTABLE CHEST 1 VIEW COMPARISON:  07/16/2015 FINDINGS: Stable cardiomegaly with thoracic aortic atherosclerosis. Central vascular congestion with interstitial edema is noted. Trace bilateral pleural effusions with slight blunting of the costophrenic angles. Left shoulder arthroplasty appears intact. Osteoarthritic native right glenohumeral joint. IMPRESSION: Stable cardiomegaly with aortic atherosclerosis. Mild central vascular  congestion with interstitial edema, increased relative to prior comparison. Electronically Signed   By: Ashley Royalty M.D.   On: 12/10/2016 03:42     CBC  Recent Labs Lab 12/11/16 1617 12/13/16 0330 12/14/16 1043 12/16/16 1048  WBC 5.8 7.1 6.9 6.9  HGB 12.7 13.1 12.8 11.8*  HCT 38.4 39.5 39.1 35.8  PLT 87* 107* 122* 130*  MCV 98.4 98.7 97.9 97.3  MCH 32.4 32.8 32.1 32.1  MCHC 32.9 33.3 32.8 33.0  RDW 14.8* 14.9* 14.8* 14.6*  LYMPHSABS  --   --  0.6*  --   MONOABS  --   --  0.5  --   EOSABS  --   --  0.0  --   BASOSABS  --   --  0.1  --     Chemistries   Recent Labs Lab 12/11/16 1617 12/13/16 0330 12/14/16 1043 12/16/16 1048  NA 137 137 135 133*  K 4.0 3.9 4.2 4.3  CL 98* 97* 96* 95*  CO2 26 29 22 24   GLUCOSE 112* 129* 117* 128*  BUN 38* 35* 44* 44*  CREATININE 3.89* 3.87* 4.54* 4.17*  CALCIUM 8.5* 9.0 8.7* 8.6*  AST  --   --  46*  --   ALT  --   --  34  --   ALKPHOS  --   --  142*  --   BILITOT  --   --  1.0  --    ------------------------------------------------------------------------------------------------------------------ estimated creatinine clearance is 8.2 mL/min (A) (by C-G formula based on SCr of 4.17 mg/dL (H)). ------------------------------------------------------------------------------------------------------------------ No results for input(s): HGBA1C in the last 72 hours. ------------------------------------------------------------------------------------------------------------------ No results for input(s): CHOL, HDL, LDLCALC, TRIG, CHOLHDL, LDLDIRECT in the last 72 hours. ------------------------------------------------------------------------------------------------------------------  Recent Labs  12/14/16 1449  TSH 5.106*   ------------------------------------------------------------------------------------------------------------------  No results for input(s): VITAMINB12, FOLATE, FERRITIN, TIBC, IRON, RETICCTPCT in the last 72  hours.  Coagulation profile  Recent Labs Lab 12/12/16 0554 12/14/16 1043 12/15/16 0145 12/16/16 0344 12/17/16 0401  INR 2.55 2.93 3.43 2.74 2.29    No results for input(s): DDIMER in the last 72 hours.  Cardiac Enzymes  Recent Labs Lab 12/14/16 1449 12/14/16 1940 12/15/16 0145  TROPONINI 0.09* 0.10* 0.09*   ------------------------------------------------------------------------------------------------------------------ Invalid input(s): POCBNP    Assessment & Plan   83 year old female with history of combined systolic and diastolic heart failure, severe mitral regurgitation, atrial fibrillation currently on warfarin and end-stage renal disease on hemodialysis who presents with worsening shortness of breath and lower extremity edema.  1. Acute hypoxic respiratory failure in the setting of combined systolic and diastolic congestive heart failure Patient recently discharged from the hospital may need oxygen on discharge  Arranged fro d/c plan today , but facility is not ready over the weekend.   2. Combined systolic and diastolic heart failure with ejection fraction of 45% and severe mitral regurgitation by echocardiogram in July Cardiology consult appreciated. Hemodialysis Done. Pt stable.  3. ESRD on hemodialysis: patient seen by nephrology  4. Atrial fibrillation, chronic: Coumadin as per pharmacy consultation Continue metoprolol for heart rate control  5. Essential hypertension: Continue metoprolol and Norvasc   6. Hypothyroidism: Continue Synthroid       Code Status Orders        Start     Ordered   12/14/16 1341  Do not attempt resuscitation (DNR)  Continuous    Question Answer Comment  In the event of cardiac or respiratory ARREST Do not call a "code blue"   In the event of cardiac or respiratory ARREST Do not perform Intubation, CPR, defibrillation or ACLS   In the event of cardiac or respiratory ARREST Use medication by any route,  position, wound care, and other measures to relive pain and suffering. May use oxygen, suction and manual treatment of airway obstruction as needed for comfort.      12/14/16 1340    Code Status History    Date Active Date Inactive Code Status Order ID Comments User Context   12/10/2016  6:53 AM 12/12/2016  4:23 PM Full Code [id number]  Harrie Foreman, MD Inpatient   09/26/2014 12:19 AM 09/26/2014  3:48 PM Full Code [id number]  Lance Coon, MD Inpatient   06/28/2014  5:47 PM 06/29/2014  2:40 PM Full Code [id number]  Gladstone Lighter, MD Inpatient    Advance Directive Documentation     Most Recent Value  Type of Advance Directive  Healthcare Power of Attorney  Pre-existing out of facility DNR order (yellow form or pink MOST form)  -  "MOST" Form in Place?  -           [facility], nephrology  DVT Prophylaxis Coumadin  Lab Results  Component Value Date   PLT 130 (L) 12/16/2016     Time Spent in minutes  35 minutes Greater than 50% of time spent in care coordination and counseling patient regarding the condition and plan of care.   Vaughan Basta M.D on 12/17/2016 at 7:17 AM  Between 7am to 6pm - Pager - [phone]  After 6pm go to [email or web address] - password [email or web address] [facility]   Office  [phone]

## 2016-12-17 NOTE — Progress Notes (Signed)
Md notified of pt's increased confusion and frequent attempts to get OOB. Pt A&O to person and situation but keeps forgetting where she is. Pt has been crying off and on. Orders for IV ativan .5mg  x 1. Will give and continue to monitor.

## 2016-12-17 NOTE — Progress Notes (Signed)
PRE HD assessment 

## 2016-12-17 NOTE — Progress Notes (Signed)
Late submission due to time constraints.  LCSW was called by Care manager and told that patient is not being accepted by [facility] facilty despite having spoken to Christy Sartorius and obtained room number and call report number, and daughter was present to transport patient.  LCSW called admissions director Levada Dy and was informed the patient is not able to be admitted today. It was explained that patient would go without medication as their pharmacy is closed on weekends and that is why they do not accept weekend admissions. LCSW  Explained situation to daughter of patient and patients nurse and it was agreed the patient would remain until Monday. Dr Boyce Medici aware of situation. New FL2 required.  Will write up situation in patient handoff for weekday LCSW.  Leota Maka LCSW

## 2016-12-17 NOTE — Progress Notes (Signed)
POST HD 

## 2016-12-17 NOTE — Progress Notes (Signed)
HD tx started  

## 2016-12-17 NOTE — Progress Notes (Signed)
Patient returned from dialysis. VS obtained. Patient is resting quietly at this time with no complaints.

## 2016-12-17 NOTE — Progress Notes (Signed)
[facility]  ROUNDING NOTE   Subjective:   Patient is very sleepy today.  She received Ativan for agitation last night Family is at bedside Her breathing is more comfortable but not back to baseline.  1500 cc of fluid was removed with dialysis yesterday  Objective:  Vital signs in last 24 hours:  Temp:  [97.8 F (36.6 C)-98.5 F (36.9 C)] 98.2 F (36.8 C) (10/28 0413) Pulse Rate:  [70-76] 71 (10/28 0940) Resp:  [17-22] 17 (10/28 0940) BP: (126-149)/(49-71) 142/55 (10/28 0940) SpO2:  [85 %-100 %] 98 % (10/28 0940) Weight:  [63 kg (138 lb 14.2 oz)-63.7 kg (140 lb 6.9 oz)] 63.5 kg (139 lb 15.9 oz) (10/28 0500)  Weight change: -2.817 kg (-6 lb 3.4 oz) Filed Weights   12/16/16 1335 12/17/16 0413 12/17/16 0500  Weight: 63 kg (138 lb 14.2 oz) 63.7 kg (140 lb 6.9 oz) 63.5 kg (139 lb 15.9 oz)    Intake/Output: I/O last 3 completed shifts: In: 600 [P.O.:600] Out: 1700 [Urine:200; Other:1500]   Intake/Output this shift:  Total I/O In: 120 [P.O.:120] Out: -   Physical Exam: General: NAD, laying in the bed  Head: Normocephalic, atraumatic. Moist oral mucosal membranes, [place] oxygen  Eyes: Anicteric,   Neck: Supple,   Lungs:   Mild bilateral diffuse crackles  Heart: No rub, irregular bradycardic  Abdomen:  Soft, nontender,   Extremities:  Trace to 1+ peripheral edema.  Neurologic: Alert to self and place only  Skin: No acute lesions  Access: Left AVG,     Basic Metabolic Panel:  Recent Labs Lab 12/11/16 1617 12/13/16 0330 12/14/16 1043 12/16/16 1048  NA 137 137 135 133*  K 4.0 3.9 4.2 4.3  CL 98* 97* 96* 95*  CO2 26 29 22 24   GLUCOSE 112* 129* 117* 128*  BUN 38* 35* 44* 44*  CREATININE 3.89* 3.87* 4.54* 4.17*  CALCIUM 8.5* 9.0 8.7* 8.6*  PHOS 6.1*  --   --  5.6*    Liver Function Tests:  Recent Labs Lab 12/11/16 1617 12/14/16 1043 12/16/16 1048  AST  --  46*  --   ALT  --  34  --   ALKPHOS  --  142*  --   BILITOT  --  1.0  --   PROT  --   6.2*  --   ALBUMIN 3.2* 3.2* 3.0*   No results for input(s): LIPASE, AMYLASE in the last 168 hours. No results for input(s): AMMONIA in the last 168 hours.  CBC:  Recent Labs Lab 12/11/16 1617 12/13/16 0330 12/14/16 1043 12/16/16 1048  WBC 5.8 7.1 6.9 6.9  NEUTROABS  --   --  5.7  --   HGB 12.7 13.1 12.8 11.8*  HCT 38.4 39.5 39.1 35.8  MCV 98.4 98.7 97.9 97.3  PLT 87* 107* 122* 130*    Cardiac Enzymes:  Recent Labs Lab 12/13/16 0615 12/14/16 1043 12/14/16 1449 12/14/16 1940 12/15/16 0145  TROPONINI 0.09* 0.11* 0.09* 0.10* 0.09*    BNP: Invalid input(s): POCBNP  CBG: No results for input(s): GLUCAP in the last 168 hours.  Microbiology: Results for orders placed or performed during the hospital encounter of 12/10/16  Blood culture (routine x 2)     Status: None   Collection Time: 12/10/16  4:15 AM  Result Value Ref Range Status   Specimen Description BLOOD RIGHT FOREARM  Final   Special Requests   Final    BOTTLES DRAWN AEROBIC AND ANAEROBIC Blood Culture adequate volume  Culture NO GROWTH 5 DAYS  Final   Report Status 12/15/2016 FINAL  Final  Blood culture (routine x 2)     Status: None   Collection Time: 12/10/16  4:22 AM  Result Value Ref Range Status   Specimen Description BLOOD RIGHT ANTECUBITAL  Final   Special Requests   Final    BOTTLES DRAWN AEROBIC AND ANAEROBIC Blood Culture adequate volume   Culture NO GROWTH 5 DAYS  Final   Report Status 12/15/2016 FINAL  Final  MRSA PCR Screening     Status: None   Collection Time: 12/10/16  6:34 AM  Result Value Ref Range Status   MRSA by PCR NEGATIVE NEGATIVE Final    Comment:        The GeneXpert MRSA Assay (FDA approved for NASAL specimens only), is one component of a comprehensive MRSA colonization surveillance program. It is not intended to diagnose MRSA infection nor to guide or monitor treatment for MRSA infections.     Coagulation Studies:  Recent Labs  12/15/16 0145 12/16/16 0344  12/17/16 0401  LABPROT 34.3* 28.8* 25.0*  INR 3.43 2.74 2.29    Urinalysis: No results for input(s): COLORURINE, LABSPEC, PHURINE, GLUCOSEU, HGBUR, BILIRUBINUR, KETONESUR, PROTEINUR, UROBILINOGEN, NITRITE, LEUKOCYTESUR in the last 72 hours.  Invalid input(s): APPERANCEUR    Imaging: Dg Chest 2 View  Result Date: 12/16/2016 CLINICAL DATA:  Pulmonary edema.  Known CHF. EXAM: CHEST  2 VIEW COMPARISON:  12/14/2016 FINDINGS: Stable cardiomegaly. Basilar chest densities particularly on the left side. Findings are suggestive for pleural fluid based on the lateral view. There may be focal consolidation or airspace disease at the left lung base. Interstitial edema has decreased or resolved. Atherosclerotic calcifications at the aortic arch. Left shoulder arthroplasty. IMPRESSION: Decreased interstitial edema. Persistent bibasilar chest densities. Findings are compatible with pleural effusions. There may be focal consolidation or airspace disease at left lung base. Electronically Signed   By: Markus Daft M.D.   On: 12/16/2016 11:23     Medications:       Assessment/ Plan:  [name] with hypertension, hypothyroidism, atrial fibrillation, hyperlipidemia, dementia, overactive bladder. First dialysis 08/24/15   TTS [facility] [place] [facility]  1. End Stage Renal Disease: TTS. patient presents with acute pulmonary edema.  -Patient is potentially scheduled for discharge tomorrow morning. We will do an extra treatment of dialysis for volume optimization  2.  Acute pulmonary edema -So far 2.5 L and 1.5 L removed with 2 different dialysis sessions Plan for extra treatment today  3. Anemia of chronic kidney disease: hemoglobin 11.8 - Mircera as outpatient.  -We will hold Procrit for now  4. Secondary Hyperparathyroidism with hyperphosphatemia:   monitor phosphorus during hospital stay Currently 5.6   LOS: 3 Kerry Walsh 10/28/20181:00 PM

## 2016-12-17 NOTE — Progress Notes (Signed)
Md notified of pt's increased confusion with more attempts to get OOB after ativan dose given. MD wants tele sitter for now and to continue to monitor. No new meds ordered due to age and ESRD. MD also made aware of lower DBP after ativan. Okay to hold am norvasc for now. Will continue to monitor.

## 2016-12-18 ENCOUNTER — Inpatient Hospital Stay: Payer: Medicare Other

## 2016-12-18 LAB — PROTIME-INR
INR: 2.42
Prothrombin Time: 26.1 seconds — ABNORMAL HIGH (ref 11.4–15.2)

## 2016-12-18 LAB — CBC
HCT: 38.6 % (ref 35.0–47.0)
HEMOGLOBIN: 13 g/dL (ref 12.0–16.0)
MCH: 33 pg (ref 26.0–34.0)
MCHC: 33.7 g/dL (ref 32.0–36.0)
MCV: 97.9 fL (ref 80.0–100.0)
PLATELETS: 111 10*3/uL — AB (ref 150–440)
RBC: 3.95 MIL/uL (ref 3.80–5.20)
RDW: 15 % — ABNORMAL HIGH (ref 11.5–14.5)
WBC: 7.2 10*3/uL (ref 3.6–11.0)

## 2016-12-18 MED ORDER — WARFARIN SODIUM 2.5 MG PO TABS: 2.5000 mg | ORAL_TABLET | Freq: Once | ORAL | Status: DC

## 2016-12-18 MED ORDER — PHENOL 1.4 % MT LIQD
1.0000 | OROMUCOSAL | Status: DC | PRN
  Filled 2016-12-18: qty 177

## 2016-12-18 MED ORDER — WARFARIN SODIUM 2 MG PO TABS
2.0000 mg | ORAL_TABLET | Freq: Once | ORAL | Status: DC
  Filled 2016-12-18: qty 1

## 2016-12-18 NOTE — Progress Notes (Signed)
[facility]  ROUNDING NOTE   Subjective:  Patient seen at bedside. Appears to be doing better overall. Due for hemodialysis again tomorrow.   Objective:  Vital signs in last 24 hours:  Temp:  [97.3 F (36.3 C)-98.6 F (37 C)] 98.1 F (36.7 C) (10/29 0445) Pulse Rate:  [45-101] 101 (10/29 0818) Resp:  [15-21] 20 (10/29 0445) BP: (124-151)/(51-96) 141/73 (10/29 0818) SpO2:  [97 %-100 %] 98 % (10/29 0445) Weight:  [64.5 kg (142 lb 3.2 oz)-65.8 kg (145 lb 1 oz)] 64.5 kg (142 lb 3.2 oz) (10/29 0500)  Weight change: 2.8 kg (6 lb 2.8 oz) Filed Weights   12/17/16 0500 12/17/16 1553 12/18/16 0500  Weight: 63.5 kg (139 lb 15.9 oz) 65.8 kg (145 lb 1 oz) 64.5 kg (142 lb 3.2 oz)    Intake/Output: I/O last 3 completed shifts: In: 360 [P.O.:360] Out: 2000 [Other:2000]   Intake/Output this shift:  Total I/O In: 220 [P.O.:220] Out: -   Physical Exam: General: NAD, laying in the bed  Head: Normocephalic, atraumatic. Moist oral mucosal membranes, [place] oxygen  Eyes: Anicteric  Neck: Supple  Lungs:  Scattered rhonchi, normal effort  Heart: No rub, irregular   Abdomen:  Soft, nontender, BS present  Extremities: Trace edema  Neurologic: Awake, alert, follow commands  Skin: No acute lesions  Access: Left AVG    Basic Metabolic Panel:  Recent Labs Lab 12/11/16 1617 12/13/16 0330 12/14/16 1043 12/16/16 1048  NA 137 137 135 133*  K 4.0 3.9 4.2 4.3  CL 98* 97* 96* 95*  CO2 26 29 22 24   GLUCOSE 112* 129* 117* 128*  BUN 38* 35* 44* 44*  CREATININE 3.89* 3.87* 4.54* 4.17*  CALCIUM 8.5* 9.0 8.7* 8.6*  PHOS 6.1*  --   --  5.6*    Liver Function Tests:  Recent Labs Lab 12/11/16 1617 12/14/16 1043 12/16/16 1048  AST  --  46*  --   ALT  --  34  --   ALKPHOS  --  142*  --   BILITOT  --  1.0  --   PROT  --  6.2*  --   ALBUMIN 3.2* 3.2* 3.0*   No results for input(s): LIPASE, AMYLASE in the last 168 hours. No results for input(s): AMMONIA in the last 168  hours.  CBC:  Recent Labs Lab 12/11/16 1617 12/13/16 0330 12/14/16 1043 12/16/16 1048 12/18/16 0611  WBC 5.8 7.1 6.9 6.9 7.2  NEUTROABS  --   --  5.7  --   --   HGB 12.7 13.1 12.8 11.8* 13.0  HCT 38.4 39.5 39.1 35.8 38.6  MCV 98.4 98.7 97.9 97.3 97.9  PLT 87* 107* 122* 130* 111*    Cardiac Enzymes:  Recent Labs Lab 12/13/16 0615 12/14/16 1043 12/14/16 1449 12/14/16 1940 12/15/16 0145  TROPONINI 0.09* 0.11* 0.09* 0.10* 0.09*    BNP: Invalid input(s): POCBNP  CBG: No results for input(s): GLUCAP in the last 168 hours.  Microbiology: Results for orders placed or performed during the hospital encounter of 12/10/16  Blood culture (routine x 2)     Status: None   Collection Time: 12/10/16  4:15 AM  Result Value Ref Range Status   Specimen Description BLOOD RIGHT FOREARM  Final   Special Requests   Final    BOTTLES DRAWN AEROBIC AND ANAEROBIC Blood Culture adequate volume   Culture NO GROWTH 5 DAYS  Final   Report Status 12/15/2016 FINAL  Final  Blood culture (routine x 2)  Status: None   Collection Time: 12/10/16  4:22 AM  Result Value Ref Range Status   Specimen Description BLOOD RIGHT ANTECUBITAL  Final   Special Requests   Final    BOTTLES DRAWN AEROBIC AND ANAEROBIC Blood Culture adequate volume   Culture NO GROWTH 5 DAYS  Final   Report Status 12/15/2016 FINAL  Final  MRSA PCR Screening     Status: None   Collection Time: 12/10/16  6:34 AM  Result Value Ref Range Status   MRSA by PCR NEGATIVE NEGATIVE Final    Comment:        The GeneXpert MRSA Assay (FDA approved for NASAL specimens only), is one component of a comprehensive MRSA colonization surveillance program. It is not intended to diagnose MRSA infection nor to guide or monitor treatment for MRSA infections.     Coagulation Studies:  Recent Labs  12/16/16 0344 12/17/16 0401 12/18/16 0611  LABPROT 28.8* 25.0* 26.1*  INR 2.74 2.29 2.42    Urinalysis: No results for input(s):  COLORURINE, LABSPEC, PHURINE, GLUCOSEU, HGBUR, BILIRUBINUR, KETONESUR, PROTEINUR, UROBILINOGEN, NITRITE, LEUKOCYTESUR in the last 72 hours.  Invalid input(s): APPERANCEUR    Imaging: Dg Chest 2 View  Result Date: 12/18/2016 CLINICAL DATA:  Hypoxia. EXAM: CHEST  2 VIEW COMPARISON:  PA and lateral chest 12/16/2016, 12/14/2016 and 07/16/2015. FINDINGS: Small bilateral pleural effusions and basilar airspace disease, worse on the left, persist without change. Cardiomegaly and interstitial edema are again seen and appear unchanged. No pneumothorax. Aortic atherosclerosis is noted. IMPRESSION: No change in pulmonary edema with associated small bilateral pleural effusions since the most recent examination. Cardiomegaly. Atherosclerosis. Electronically Signed   By: Inge Rise M.D.   On: 12/18/2016 10:27     Medications:       Assessment/ Plan:  [name] with hypertension, hypothyroidism, atrial fibrillation, hyperlipidemia, dementia, overactive bladder. First dialysis 08/24/15   TTS [facility]  1. End Stage Renal Disease: TTS. patient presented with acute pulmonary edema.  -Overall patient appears to be doing well. No urgent indication for dialysis today. Her volume status has improved.  2.  Acute pulmonary edema -Patient had extra dialysis treatment yesterday. Tolerated well. Breathing comfortably at the moment.  3. Anemia of chronic kidney disease: hemoglobin up to 13. Hold off on Procrit.  4. Secondary Hyperparathyroidism with hyperphosphatemia:  Most recent serum phosphorus was 5.6 which is close to target. We will continue to monitor as an outpatient.   LOS: 4 Kerry Walsh 10/29/201812:12 PM

## 2016-12-18 NOTE — Care Management (Signed)
Patient to discharge back to [facility] today.  Tim with [facility] notified of discharge.  Elvera Bicker HD liaison notified of discharge. Portable O2 tank at bedside for discharge.  RNCM spoke with Floydene Flock at [facility].  O2 set up to be completed today at [facility].  Corene Cornea to notify RNCM when set up completed.

## 2016-12-18 NOTE — Progress Notes (Signed)
ANTICOAGULATION CONSULT NOTE - FOLLOW UP  Pharmacy Consult for warfarin Indication: atrial fibrillation  Allergies  Allergen Reactions  . Penicillins Rash    Has patient had a PCN reaction causing immediate rash, facial/tongue/throat swelling, SOB or lightheadedness with hypotension: Yes Has patient had a PCN reaction causing severe rash involving mucus membranes or skin necrosis: No Has patient had a PCN reaction that required hospitalization No Has patient had a PCN reaction occurring within the last 10 years: No If all of the above answers are "NO", then may proceed with Cephalosporin use.    Patient Measurements: Height: 5\' 5"  (165.1 cm) Weight: 142 lb 3.2 oz (64.5 kg) IBW/kg (Calculated) : 57  Vital Signs: Temp: 98.1 F (36.7 C) (10/29 0445) Temp Source: Oral (10/29 0445) BP: 140/73 (10/29 0445) Pulse Rate: 62 (10/29 0445)  Labs:  Recent Labs  12/16/16 0344 12/16/16 1048 12/17/16 0401 12/18/16 0611  HGB  --  11.8*  --  13.0  HCT  --  35.8  --  38.6  PLT  --  130*  --  111*  LABPROT 28.8*  --  25.0* 26.1*  INR 2.74  --  2.29 2.42  CREATININE  --  4.17*  --   --     Estimated Creatinine Clearance: 8.2 mL/min (A) (by C-G formula based on SCr of 4.17 mg/dL (H)).  Assessment: 83 yo female on warfarin prior to admission for AFib. Pt with ESRD on HD admitted for acute exacerbation of CHF.  INR has been trending up steadily since previous admission earlier this week. Pt had been receiving 3 mg of warfarin during the previous admission. INR now borderline for being high. PTA med list notes dose of warfarin 4 mg daily.  DATE INR DOSE 10/25 2.93 2 mg 10/26 3.43 HELD 10/27   2.74   2 mg 10/28 2.29 2mg   10/29   2.42  Goal of Therapy:  INR 2-3 Monitor platelets by anticoagulation protocol: Yes   Plan:  INR remains therapeutic. Will give another warfarin 2 mg PO this evening.  Giovany Cosby M Kamoria Lucien, Pharm.D., BCPS Clinical Pharmacist 12/18/2016,7:39 AM

## 2016-12-18 NOTE — NC FL2 (Signed)
[place] LEVEL OF CARE SCREENING TOOL     IDENTIFICATION  Patient Name: Kerry Walsh Birthdate: 1925-02-28 Sex: female Admission Date (Current Location): 12/14/2016  [place] and [place] Number:  Engineering geologist and Address:  [facility], [address], [place], [place] [address]      Provider Number: [id number]  Attending Physician Name and Address:  Vaughan Basta, *  Relative Name and Phone Number:       Current Level of Care: Hospital Recommended Level of Care: [place] Prior Approval Number:    Date Approved/Denied:   PASRR Number:    Discharge Plan:  (ALF)    Current Diagnoses: Patient Active Problem List   Diagnosis Date Noted  . Pulmonary edema 12/14/2016  . Acute on chronic diastolic heart failure ([place]) 12/10/2016  . Hyperlipidemia 02/29/2016  . ESRD on dialysis ([place]) 02/29/2016  . 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 MI (mitral incompetence) 12/25/2014  . Chronic systolic heart failure ([place]) 10/01/2014  . Elevated troponin 09/25/2014  . HTN (hypertension) 09/25/2014  . Acute CHF ([place]) 09/25/2014  . Essential (primary) hypertension 09/25/2014  . Dizziness 06/28/2014  . A-fib ([place]) 06/28/2014  . Dizziness and giddiness 06/28/2014  . Osteoporosis, post-menopausal 03/09/2014  . ADH disorder ([place]) 01/23/2014  . Disorder of posterior pituitary ([place]) 01/23/2014  . Syndrome of inappropriate secretion of antidiuretic hormone ([place]) 01/23/2014  . Infected prosthetic knee joint ([place]) 01/12/2014  . Infection and inflammatory reaction due to internal prosthetic device, implant, and graft 01/12/2014  . Arteriosclerosis of coronary artery 08/23/2013  . Hypothyroid 08/23/2013    Orientation RESPIRATION BLADDER Height & Weight     Self, Place, Time  O2 (2 liters) Incontinent Weight: 142 lb 3.2 oz (64.5 kg) Height:  5\' 5"  (165.1 cm)  BEHAVIORAL SYMPTOMS/MOOD NEUROLOGICAL  BOWEL NUTRITION STATUS   (none)   Incontinent Diet (renal diet)  AMBULATORY STATUS COMMUNICATION OF NEEDS Skin   Limited Assist Verbally                         Personal Care Assistance Level of Assistance  Bathing, Dressing Bathing Assistance: Limited assistance Feeding assistance: Limited assistance Dressing Assistance: Limited assistance     Functional Limitations Info             SPECIAL CARE FACTORS FREQUENCY  PT (By licensed PT)                    Contractures Contractures Info: Not present    Additional Factors Info  Code Status, Allergies Code Status Info: dnr Allergies Info: pcn's           DISCHARGE MEDICATIONS:       Current Discharge Medication List        CONTINUE these medications which have CHANGED   Details  warfarin (COUMADIN) 2 MG tablet Take 1 tablet (2 mg total) by mouth daily at 6 PM. Qty: 20 tablet, Refills: 0          CONTINUE these medications which have NOT CHANGED   Details  amLODipine (NORVASC) 5 MG tablet Take 5 mg by mouth every morning.     calcitRIOL (ROCALTROL) 0.25 MCG capsule Take 0.25 mcg by mouth daily.    Colloidal Oatmeal (EUCERIN ECZEMA RELIEF EX) Apply 1 application topically 2 (two) times daily. Apply to feet and legs    levothyroxine (SYNTHROID, LEVOTHROID) 88 MCG tablet Take 88 mcg by mouth daily  before breakfast.    Associated Diagnoses: Carcinoid tumor of ovary, malignant ([name])    lidocaine (LIDODERM) 5 % Place 1 patch onto the skin daily. Remove & Discard patch within 12 hours or as directed by MD Qty: 10 patch, Refills: 0    metoprolol (LOPRESSOR) 50 MG tablet Take 1 tablet (50 mg total) by mouth 2 (two) times daily. Qty: 60 tablet, Refills: 0    torsemide (DEMADEX) 20 MG tablet Take 2 tablets (40 mg total) by mouth daily. Qty: 60 tablet, Refills: 0    acetaminophen (TYLENOL) 325 MG tablet Take 650 mg by mouth 4 (four) times daily as needed for mild pain or moderate pain.     traMADol (ULTRAM) 50 MG tablet Take 50 mg by mouth every 4 (four) hours as needed for moderate pain.    Wound Dressings (GELOCAST UNNAS BOOT) MISC Apply 1 application topically every 3 (three) days. Apply to bilateral legs     Additional Information To Resume [facility] [place]

## 2016-12-18 NOTE — Care Management Important Message (Signed)
Important Message  Patient Details  Name: Kerry Walsh MRN: [id number] Date of Birth: 07/16/1925   Medicare Important Message Given:  Yes    Beverly Sessions, RN 12/18/2016, 10:21 AM

## 2016-12-18 NOTE — Clinical Social Work Note (Signed)
Patient to discharge today to return to [facility]. CSW has sent discharge information again to [facility]. Patient's daughter is aware and will transport. Patient has portable oxygen tanks with her to take to the facility. [facility] will have oxygen delivered to facility between 3-7. Shela Leff MSW,LCSW [phone]

## 2016-12-18 NOTE — Care Management (Signed)
Kerry Walsh HD liaison is aware of admission.

## 2016-12-18 NOTE — Progress Notes (Signed)
IV was removed. Discharge instructions and follow-up appointments were provided to the pt and daughter at bedside. The pt was taken downstairs via wheelchair by NT. Daughter is to transport to [facility].

## 2016-12-18 NOTE — Discharge Summary (Signed)
[facility] [place] NAME: Kerry Walsh    MR#:  [id number]  DATE OF BIRTH:  08-14-1925  DATE OF ADMISSION:  12/14/2016 ADMITTING PHYSICIAN: Bettey Costa, MD  DATE OF DISCHARGE: 12/18/2016  PRIMARY CARE PHYSICIAN: [facility]    ADMISSION DIAGNOSIS:  Dyspnea, unspecified type [R06.00]  DISCHARGE DIAGNOSIS:  Active Problems:   Pulmonary edema  SECONDARY DIAGNOSIS:   Past Medical History:  Diagnosis Date  . Anemia   . Anxiety   . Arthritis   . Atrial fibrillation ([place])   . Cancer [facility])    Carcinoid Tumor  . CHF (congestive heart failure) ([place])   . Chronic kidney disease    CKD with last known creatinine fo 2.4 in Dec 2015  . Coronary artery disease   . Dementia   . Depression   . Dialysis patient [facility])    Mon. Wed. Fri.  . Dysrhythmia    Atrial Fibrillation  . Hypertension   . Hypothyroidism   . Malignant poorly differentiated neuroendocrine carcinoma ([place]) 08/12/2014  . Mitral valve regurgitation   . Osteoporosis   . Stroke [facility])    No residual neurological deficits    HOSPITAL COURSE:   83 year old female with history of combined systolic and diastolic heart failure, severe mitral regurgitation, atrial fibrillation currently on warfarin and end-stage renal disease on hemodialysis who presents with worsening shortness of breath and lower extremity edema.  1. Acute hypoxic respiratory failure in the setting of combined systolic and diastolic congestive heart failure Patient recently discharged from the hospital , need oxygen on discharge  Inspite of doing HD and removing fluid, she continue to stay hypoxic with exertion with PT. Ordered O2 on d/c  2. Combined systolic and diastolic heart failure with ejection fraction of 45% and severe mitral regurgitation by echocardiogram in July Cardiology consult appreciated. Extra Hemodialysis done, better now.  3. ESRD on hemodialysis: patient seen  by nephrology  4. Atrial fibrillation, chronic: Coumadin as per pharmacy consultation Continue metoprolol for heart rate control  5. Essential hypertension: Continue metoprolol and Norvasc  6. Hypothyroidism: Continue Synthroid   DISCHARGE CONDITIONS:   Stable.  CONSULTS OBTAINED:  Treatment Team:  Murlean Iba, MD Yolonda Kida, MD  DRUG ALLERGIES:   Allergies  Allergen Reactions  . Penicillins Rash    Has patient had a PCN reaction causing immediate rash, facial/tongue/throat swelling, SOB or lightheadedness with hypotension: Yes Has patient had a PCN reaction causing severe rash involving mucus membranes or skin necrosis: No Has patient had a PCN reaction that required hospitalization No Has patient had a PCN reaction occurring within the last 10 years: No If all of the above answers are "NO", then may proceed with Cephalosporin use.     DISCHARGE MEDICATIONS:   Current Discharge Medication List    CONTINUE these medications which have CHANGED   Details  warfarin (COUMADIN) 2 MG tablet Take 1 tablet (2 mg total) by mouth daily at 6 PM. Qty: 20 tablet, Refills: 0      CONTINUE these medications which have NOT CHANGED   Details  amLODipine (NORVASC) 5 MG tablet Take 5 mg by mouth every morning.     calcitRIOL (ROCALTROL) 0.25 MCG capsule Take 0.25 mcg by mouth daily.    Colloidal Oatmeal (EUCERIN ECZEMA RELIEF EX) Apply 1 application topically 2 (two) times daily. Apply to feet and legs    levothyroxine (SYNTHROID, LEVOTHROID) 88 MCG tablet Take 88 mcg by mouth  daily before breakfast.    Associated Diagnoses: Carcinoid tumor of ovary, malignant ([name])    lidocaine (LIDODERM) 5 % Place 1 patch onto the skin daily. Remove & Discard patch within 12 hours or as directed by MD Qty: 10 patch, Refills: 0    metoprolol (LOPRESSOR) 50 MG tablet Take 1 tablet (50 mg total) by mouth 2 (two) times daily. Qty: 60 tablet, Refills: 0    torsemide (DEMADEX) 20  MG tablet Take 2 tablets (40 mg total) by mouth daily. Qty: 60 tablet, Refills: 0    acetaminophen (TYLENOL) 325 MG tablet Take 650 mg by mouth 4 (four) times daily as needed for mild pain or moderate pain.    traMADol (ULTRAM) 50 MG tablet Take 50 mg by mouth every 4 (four) hours as needed for moderate pain.    Wound Dressings (GELOCAST UNNAS BOOT) MISC Apply 1 application topically every 3 (three) days. Apply to bilateral legs         DISCHARGE INSTRUCTIONS:   Follow with PMD in 1-2 weeks.  If you experience worsening of your admission symptoms, develop shortness of breath, life threatening emergency, suicidal or homicidal thoughts you must seek medical attention immediately by calling 911 or calling your MD immediately  if symptoms less severe.  You Must read complete instructions/literature along with all the possible adverse reactions/side effects for all the Medicines you take and that have been prescribed to you. Take any new Medicines after you have completely understood and accept all the possible adverse reactions/side effects.   Please note  You were cared for by a hospitalist during your hospital stay. If you have any questions about your discharge medications or the care you received while you were in the hospital after you are discharged, you can call the unit and asked to speak with the hospitalist on call if the hospitalist that took care of you is not available. Once you are discharged, your primary care physician will handle any further medical issues. Please note that NO REFILLS for any discharge medications will be authorized once you are discharged, as it is imperative that you return to your primary care physician (or establish a relationship with a primary care physician if you do not have one) for your aftercare needs so that they can reassess your need for medications and monitor your lab values.    Today   CHIEF COMPLAINT:   Chief Complaint  Patient presents  with  . Shortness of Breath    HISTORY OF PRESENT ILLNESS:  Kerry Walsh  is a 83 y.o. female with a known history of end-stage renal disease on hemodialysis, chronic combined systolic and diastolic heart failure with ejection fraction 45% and severe mitral regurgitation, essential hypertension who presents to the emergency room due to increasing shortness of breath. Patient was discharged from the hospital a few days ago due to shortness of breath from severe mitral regurgitation and pulmonary edema. She is brought back to the hospital for ongoing symptoms. She denies PND, orthopnea, but has increased LEE. Chest x-ray today shows mild interstitial edema. She was given 60 mg IV of Lasix. Shortness of breath is somewhat improved.  She has dialysis on Tuesday, Thursday and Saturday. She received dialysis on Tuesday. She is supposed to have dialysis today but she did not go today. Her oxygen level was 87% at [place] when EMS arrived.   VITAL SIGNS:  Blood pressure (!) 141/73, pulse (!) 101, temperature 98.1 F (36.7 C), temperature source Oral, resp. rate  20, height 5\' 5"  (1.651 m), weight 64.5 kg (142 lb 3.2 oz), SpO2 98 %.  I/O:    Intake/Output Summary (Last 24 hours) at 12/18/16 1239 Last data filed at 12/18/16 1005  Gross per 24 hour  Intake              460 ml  Output             2000 ml  Net            -1540 ml    PHYSICAL EXAMINATION:  GENERAL: Pleasant-appearing in no apparent distress.  HEAD, EYES, EARS, NOSE AND THROAT: Atraumatic, normocephalic. Extraocular muscles are intact. Pupils equal and reactive to light. Sclerae anicteric. No conjunctival injection. No oro-pharyngeal erythema.  NECK: Supple. There is no jugular venous distention. No bruits, no lymphadenopathy, no thyromegaly.  HEART: Regular rate and rhythm,. No murmurs, no rubs, no clicks.  LUNGS: crackles at the bases no associated muscle usage  ABDOMEN: Soft, flat, nontender, nondistended. Has good bowel  sounds. No hepatosplenomegaly appreciated.  EXTREMITIES: No evidence of any cyanosis, clubbing, or peripheral edema.  +2 pedal and radial pulses bilaterally.  NEUROLOGIC: The patient is alert, awake, and oriented x3 with no focal motor or sensory deficits appreciated bilaterally.  SKIN: Moist and warm with no rashes appreciated.  Psych: Not anxious, depressed LN: No inguinal LN enlargement   DATA REVIEW:   CBC  Recent Labs Lab 12/18/16 0611  WBC 7.2  HGB 13.0  HCT 38.6  PLT 111*    Chemistries   Recent Labs Lab 12/14/16 1043 12/16/16 1048  NA 135 133*  K 4.2 4.3  CL 96* 95*  CO2 22 24  GLUCOSE 117* 128*  BUN 44* 44*  CREATININE 4.54* 4.17*  CALCIUM 8.7* 8.6*  AST 46*  --   ALT 34  --   ALKPHOS 142*  --   BILITOT 1.0  --     Cardiac Enzymes  Recent Labs Lab 12/15/16 0145  TROPONINI 0.09*    Microbiology Results  Results for orders placed or performed during the hospital encounter of 12/10/16  Blood culture (routine x 2)     Status: None   Collection Time: 12/10/16  4:15 AM  Result Value Ref Range Status   Specimen Description BLOOD RIGHT FOREARM  Final   Special Requests   Final    BOTTLES DRAWN AEROBIC AND ANAEROBIC Blood Culture adequate volume   Culture NO GROWTH 5 DAYS  Final   Report Status 12/15/2016 FINAL  Final  Blood culture (routine x 2)     Status: None   Collection Time: 12/10/16  4:22 AM  Result Value Ref Range Status   Specimen Description BLOOD RIGHT ANTECUBITAL  Final   Special Requests   Final    BOTTLES DRAWN AEROBIC AND ANAEROBIC Blood Culture adequate volume   Culture NO GROWTH 5 DAYS  Final   Report Status 12/15/2016 FINAL  Final  MRSA PCR Screening     Status: None   Collection Time: 12/10/16  6:34 AM  Result Value Ref Range Status   MRSA by PCR NEGATIVE NEGATIVE Final    Comment:        The GeneXpert MRSA Assay (FDA approved for NASAL specimens only), is one component of a comprehensive MRSA colonization surveillance  program. It is not intended to diagnose MRSA infection nor to guide or monitor treatment for MRSA infections.     RADIOLOGY:  Dg Chest 2 View  Result Date: 12/18/2016 CLINICAL DATA:  Hypoxia. EXAM: CHEST  2 VIEW COMPARISON:  PA and lateral chest 12/16/2016, 12/14/2016 and 07/16/2015. FINDINGS: Small bilateral pleural effusions and basilar airspace disease, worse on the left, persist without change. Cardiomegaly and interstitial edema are again seen and appear unchanged. No pneumothorax. Aortic atherosclerosis is noted. IMPRESSION: No change in pulmonary edema with associated small bilateral pleural effusions since the most recent examination. Cardiomegaly. Atherosclerosis. Electronically Signed   By: Inge Rise M.D.   On: 12/18/2016 10:27    EKG:   Orders placed or performed during the hospital encounter of 12/14/16  . ED EKG  . ED EKG      Management plans discussed with the patient, family and they are in agreement.  CODE STATUS:     Code Status Orders        Start     Ordered   12/14/16 1341  Do not attempt resuscitation (DNR)  Continuous    Question Answer Comment  In the event of cardiac or respiratory ARREST Do not call a "code blue"   In the event of cardiac or respiratory ARREST Do not perform Intubation, CPR, defibrillation or ACLS   In the event of cardiac or respiratory ARREST Use medication by any route, position, wound care, and other measures to relive pain and suffering. May use oxygen, suction and manual treatment of airway obstruction as needed for comfort.      12/14/16 1340    Code Status History    Date Active Date Inactive Code Status Order ID Comments User Context   12/10/2016  6:53 AM 12/12/2016  4:23 PM Full Code [id number]  Harrie Foreman, MD Inpatient   09/26/2014 12:19 AM 09/26/2014  3:48 PM Full Code [id number]  Lance Coon, MD Inpatient   06/28/2014  5:47 PM 06/29/2014  2:40 PM Full Code [id number]  Gladstone Lighter, MD Inpatient     Advance Directive Documentation     Most Recent Value  Type of Advance Directive  Healthcare Power of Attorney  Pre-existing out of facility DNR order (yellow form or pink MOST form)  -  "MOST" Form in Place?  -      TOTAL TIME TAKING CARE OF THIS PATIENT: 35 minutes.    Vaughan Basta M.D on 12/18/2016 at 12:39 PM  Between 7am to 6pm - Pager - [phone]  After 6pm go to [email or web address] - password [email or web address] [place] [facility]  Office  [phone]  CC: Primary care physician; [facility]   Note: This dictation was prepared with Dragon dictation along with smaller phrase technology. Any transcriptional errors that result from this process are unintentional.

## 2016-12-27 ENCOUNTER — Emergency Department: Payer: Medicare Other

## 2016-12-27 ENCOUNTER — Encounter: Payer: Self-pay | Admitting: *Deleted

## 2016-12-27 ENCOUNTER — Other Ambulatory Visit: Payer: Self-pay

## 2016-12-27 ENCOUNTER — Emergency Department
Admission: EM | Admit: 2016-12-27 | Discharge: 2016-12-27 | Disposition: A | Discharge status: Home / Self Care | Payer: Medicare Other | Attending: Emergency Medicine | Admitting: Emergency Medicine

## 2016-12-27 DIAGNOSIS — N186 End stage renal disease: Secondary | ICD-10-CM | POA: Diagnosis not present

## 2016-12-27 DIAGNOSIS — Z96653 Presence of artificial knee joint, bilateral: Secondary | ICD-10-CM | POA: Insufficient documentation

## 2016-12-27 DIAGNOSIS — I2510 Atherosclerotic heart disease of native coronary artery without angina pectoris: Secondary | ICD-10-CM | POA: Insufficient documentation

## 2016-12-27 DIAGNOSIS — F0390 Unspecified dementia without behavioral disturbance: Secondary | ICD-10-CM | POA: Insufficient documentation

## 2016-12-27 DIAGNOSIS — W19XXXA Unspecified fall, initial encounter: Secondary | ICD-10-CM | POA: Insufficient documentation

## 2016-12-27 DIAGNOSIS — Z992 Dependence on renal dialysis: Secondary | ICD-10-CM | POA: Diagnosis not present

## 2016-12-27 DIAGNOSIS — E039 Hypothyroidism, unspecified: Secondary | ICD-10-CM | POA: Diagnosis not present

## 2016-12-27 DIAGNOSIS — Z8673 Personal history of transient ischemic attack (TIA), and cerebral infarction without residual deficits: Secondary | ICD-10-CM | POA: Insufficient documentation

## 2016-12-27 DIAGNOSIS — I5042 Chronic combined systolic (congestive) and diastolic (congestive) heart failure: Secondary | ICD-10-CM | POA: Insufficient documentation

## 2016-12-27 DIAGNOSIS — Z79899 Other long term (current) drug therapy: Secondary | ICD-10-CM | POA: Diagnosis not present

## 2016-12-27 DIAGNOSIS — Z7901 Long term (current) use of anticoagulants: Secondary | ICD-10-CM | POA: Insufficient documentation

## 2016-12-27 DIAGNOSIS — Z0489 Encounter for examination and observation for other specified reasons: Secondary | ICD-10-CM | POA: Insufficient documentation

## 2016-12-27 DIAGNOSIS — I132 Hypertensive heart and chronic kidney disease with heart failure and with stage 5 chronic kidney disease, or end stage renal disease: Secondary | ICD-10-CM | POA: Diagnosis not present

## 2016-12-27 NOTE — ED Provider Notes (Signed)
[facility] Emergency Department Provider Note  Time seen: 8:07 AM  I have reviewed the triage vital signs and the nursing notes.   HISTORY  Chief Complaint Fall    HPI Kerry Walsh is a 83 y.o. female with a past medical history of anemia, anxiety, atrial fibrillation on Coumadin, end-stage renal disease on dialysis, hypertension, CVA, dementia, presents to the emergency department after being found on the floor this morning.  The patient does not recall falling onto the floor.  Per report the patient had a pillow and blankets on the floor.  Patient denies any complaints at this time.  No sign of head injury.  Daughter is here with the patient.   Past Medical History:  Diagnosis Date  . Anemia   . Anxiety   . Arthritis   . Atrial fibrillation ([place])   . Cancer [facility])    Carcinoid Tumor  . CHF (congestive heart failure) ([place])   . Chronic kidney disease    CKD with last known creatinine fo 2.4 in Dec 2015  . Coronary artery disease   . Dementia   . Depression   . Dialysis patient [facility])    Mon. Wed. Fri.  . Dysrhythmia    Atrial Fibrillation  . Hypertension   . Hypothyroidism   . Malignant poorly differentiated neuroendocrine carcinoma ([place]) 08/12/2014  . Mitral valve regurgitation   . Osteoporosis   . Stroke [facility])    No residual neurological deficits    Patient Active Problem List   Diagnosis Date Noted  . Pulmonary edema 12/14/2016  . Acute on chronic diastolic heart failure ([place]) 12/10/2016  . Hyperlipidemia 02/29/2016  . ESRD on dialysis ([place]) 02/29/2016  . Carcinoid tumor of ovary, malignant ([place]) 11/01/2015  . MI (mitral incompetence) 12/25/2014  . Chronic systolic heart failure ([place]) 10/01/2014  . Elevated troponin 09/25/2014  . HTN (hypertension) 09/25/2014  . Acute CHF ([place]) 09/25/2014  . Essential (primary) hypertension 09/25/2014  . Dizziness 06/28/2014  . A-fib ([place]) 06/28/2014  . Dizziness and giddiness 06/28/2014  .  Osteoporosis, post-menopausal 03/09/2014  . ADH disorder ([place]) 01/23/2014  . Disorder of posterior pituitary ([place]) 01/23/2014  . Syndrome of inappropriate secretion of antidiuretic hormone ([place]) 01/23/2014  . Infected prosthetic knee joint ([place]) 01/12/2014  . Infection and inflammatory reaction due to internal prosthetic device, implant, and graft 01/12/2014  . Arteriosclerosis of coronary artery 08/23/2013  . Hypothyroid 08/23/2013    Past Surgical History:  Procedure Laterality Date  . ABDOMINAL HYSTERECTOMY    . BACK SURGERY    . CHOLECYSTECTOMY    . EYE SURGERY Bilateral    Cataract Extraction with IOL  . JOINT REPLACEMENT Bilateral    Knee replacement surgery- bilateral  . SHOULDER SURGERY Bilateral     Prior to Admission medications   Medication Sig Start Date End Date Taking? Authorizing Provider  acetaminophen (TYLENOL) 325 MG tablet Take 650 mg by mouth 4 (four) times daily as needed for mild pain or moderate pain.    [provider]  amLODipine (NORVASC) 5 MG tablet Take 5 mg by mouth every morning.     [provider]  calcitRIOL (ROCALTROL) 0.25 MCG capsule Take 0.25 mcg by mouth daily.    [provider]  Colloidal Oatmeal (EUCERIN ECZEMA RELIEF EX) Apply 1 application topically 2 (two) times daily. Apply to feet and legs    [provider]  levothyroxine (SYNTHROID, LEVOTHROID) 88 MCG tablet Take 88 mcg by mouth daily before breakfast.  10/23/15   [provider]  metoprolol (LOPRESSOR) 50 MG tablet Take 1 tablet (50 mg total) by mouth 2 (two) times daily. 09/28/14   Loletha Grayer, MD  torsemide (DEMADEX) 20 MG tablet Take 2 tablets (40 mg total) by mouth daily. 12/13/16   Demetrios Loll, MD  traMADol (ULTRAM) 50 MG tablet Take 50 mg by mouth every 4 (four) hours as needed for moderate pain.    [provider]  warfarin (COUMADIN) 2 MG tablet Take 1 tablet (2 mg total) by mouth daily at 6 PM. 12/16/16   Vaughan Basta, MD  Wound Dressings (GELOCAST UNNAS BOOT) MISC Apply 1 application topically every 3 (three) days. Apply to bilateral legs    [provider]    Allergies  Allergen Reactions  . Penicillins Rash    Has patient had a PCN reaction causing immediate rash, facial/tongue/throat swelling, SOB or lightheadedness with hypotension: Yes Has patient had a PCN reaction causing severe rash involving mucus membranes or skin necrosis: No Has patient had a PCN reaction that required hospitalization No Has patient had a PCN reaction occurring within the last 10 years: No If all of the above answers are "NO", then may proceed with Cephalosporin use.     Family History  Problem Relation Age of Onset  . CAD Father   . Stroke Father   . Bladder Cancer Father   . Kidney failure Mother   . Kidney cancer Neg Hx     Social History Social History   Tobacco Use  . Smoking status: Never Smoker  . Smokeless tobacco: Never Used  Substance Use Topics  . Alcohol use: No  . Drug use: No    Review of Systems Cardiovascular: Negative for chest pain. Respiratory: Negative for shortness of breath. Gastrointestinal: Negative for abdominal pain Musculoskeletal: Positive for lower back pain, chronic per daughter Skin: Negative for abrasion or laceration Neurological: Negative for headache All other ROS negative  ____________________________________________   PHYSICAL EXAM:  VITAL SIGNS: ED Triage Vitals  Enc Vitals Group     BP 12/27/16 0734 (!) 146/71     Pulse Rate 12/27/16 0734 67     Resp 12/27/16 0734 18     Temp 12/27/16 0734 98.7 F (37.1 C)     Temp Source 12/27/16 0734 Oral     SpO2 12/27/16 0734 (!) 89 %     Weight 12/27/16 0732 142 lb (64.4 kg)     Height 12/27/16 0732 5\' 5"  (1.651 m)     Head Circumference --      Peak Flow --      Pain Score --      Pain Loc --      Pain Edu? --      Excl. in [place]? --    Constitutional: Alert, no distress, lying in bed  comfortably. Eyes: Normal exam ENT   Head: Normocephalic and atraumatic.   Mouth/Throat: Mucous membranes are moist. Cardiovascular: Regular rate, somewhat irregular rhythm around 60 bpm. Respiratory: Normal respiratory effort without tachypnea nor retractions. Breath sounds are clear Gastrointestinal: Soft and nontender. No distention.  Musculoskeletal: Nontender with normal range of motion in all extremities. Neurologic:  Normal speech and language. No gross focal neurologic deficits Skin:  Skin is warm, dry and intact.  Psychiatric: Mood and affect are normal  ____________________________________________   RADIOLOGY  CT head shows chronic left frontal infarct.  No acute abnormalities.  EKG reviewed and interpreted by myself shows atrial fibrillation at 111 bpm with  a wide QRS, nonspecific ST changes.  Slightly prolonged QTC.  No ST elevation noted. ____________________________________________   INITIAL IMPRESSION / ASSESSMENT AND PLAN / ED COURSE  Pertinent labs & imaging results that were available during my care of the patient were reviewed by me and considered in my medical decision making (see chart for details).  Patient presents to the emergency department after a fall, possibly slid out of bed.  No signs of trauma on exam.  Good range of motion all extremities, no CT or L-spine tenderness.  Patient is on Coumadin.  Differential would include head injury, ICH, contusions.  We will obtain a CT scan of the head to evaluate given the unwitnessed fall and history of anticoagulation.  CT negative for acute abnormality.  Patient continues to appear well.  We will discharge the patient home with PCP follow-up.  ____________________________________________   FINAL CLINICAL IMPRESSION(S) / ED DIAGNOSES  Cheral Marker, MD 12/27/16 [phone]

## 2016-12-27 NOTE — ED Notes (Signed)
Patient transported to CT 

## 2016-12-27 NOTE — ED Triage Notes (Addendum)
Pt arrives via EMS from [facility], states staff found her on the floor this AM, pt states she slid out of bed, upon arrival no complaints, awake and alert, hx of afib on coumadin and dialysis pt

## 2016-12-27 NOTE — ED Notes (Signed)
Pt and Pt's daughter verbalized understanding of discharge instructions. NAD at this time. 

## 2017-01-05 ENCOUNTER — Inpatient Hospital Stay: Payer: Medicare Other | Attending: Internal Medicine

## 2017-01-05 DIAGNOSIS — C7A098 Malignant carcinoid tumors of other sites: Secondary | ICD-10-CM

## 2017-01-05 DIAGNOSIS — Z79899 Other long term (current) drug therapy: Secondary | ICD-10-CM | POA: Diagnosis not present

## 2017-01-05 MED ORDER — OCTREOTIDE ACETATE 30 MG IM KIT
30.0000 mg | PACK | Freq: Once | INTRAMUSCULAR | Status: AC
  Administered 2017-01-05: 30 mg via INTRAMUSCULAR

## 2017-02-01 ENCOUNTER — Encounter (INDEPENDENT_AMBULATORY_CARE_PROVIDER_SITE_OTHER): Payer: Medicare Other

## 2017-02-01 ENCOUNTER — Ambulatory Visit (INDEPENDENT_AMBULATORY_CARE_PROVIDER_SITE_OTHER): Payer: Medicare Other | Admitting: Vascular Surgery

## 2017-02-05 ENCOUNTER — Ambulatory Visit: Payer: Medicare Other

## 2017-02-05 ENCOUNTER — Inpatient Hospital Stay: Payer: Medicare Other | Attending: Internal Medicine

## 2017-02-05 DIAGNOSIS — C7A098 Malignant carcinoid tumors of other sites: Secondary | ICD-10-CM | POA: Insufficient documentation

## 2017-02-05 DIAGNOSIS — Z79899 Other long term (current) drug therapy: Secondary | ICD-10-CM | POA: Diagnosis not present

## 2017-02-05 MED ORDER — OCTREOTIDE ACETATE 30 MG IM KIT
30.0000 mg | PACK | Freq: Once | INTRAMUSCULAR | Status: AC
  Administered 2017-02-05: 30 mg via INTRAMUSCULAR

## 2017-02-05 NOTE — Patient Instructions (Signed)

## 2017-02-27 ENCOUNTER — Emergency Department: Payer: Medicare Other

## 2017-02-27 ENCOUNTER — Inpatient Hospital Stay
Admission: EM | Admit: 2017-02-27 | Discharge: 2017-03-23 | DRG: 871 | Disposition: E | Payer: Medicare Other | Attending: Internal Medicine | Admitting: Internal Medicine

## 2017-02-27 ENCOUNTER — Encounter: Payer: Self-pay | Admitting: Emergency Medicine

## 2017-02-27 ENCOUNTER — Other Ambulatory Visit: Payer: Self-pay

## 2017-02-27 DIAGNOSIS — R627 Adult failure to thrive: Secondary | ICD-10-CM | POA: Diagnosis present

## 2017-02-27 DIAGNOSIS — Z7901 Long term (current) use of anticoagulants: Secondary | ICD-10-CM

## 2017-02-27 DIAGNOSIS — J189 Pneumonia, unspecified organism: Secondary | ICD-10-CM | POA: Diagnosis present

## 2017-02-27 DIAGNOSIS — Z992 Dependence on renal dialysis: Secondary | ICD-10-CM

## 2017-02-27 DIAGNOSIS — F419 Anxiety disorder, unspecified: Secondary | ICD-10-CM | POA: Diagnosis present

## 2017-02-27 DIAGNOSIS — F329 Major depressive disorder, single episode, unspecified: Secondary | ICD-10-CM | POA: Diagnosis present

## 2017-02-27 DIAGNOSIS — I2510 Atherosclerotic heart disease of native coronary artery without angina pectoris: Secondary | ICD-10-CM | POA: Diagnosis present

## 2017-02-27 DIAGNOSIS — Z515 Encounter for palliative care: Secondary | ICD-10-CM | POA: Diagnosis present

## 2017-02-27 DIAGNOSIS — Z66 Do not resuscitate: Secondary | ICD-10-CM | POA: Diagnosis present

## 2017-02-27 DIAGNOSIS — Z8249 Family history of ischemic heart disease and other diseases of the circulatory system: Secondary | ICD-10-CM | POA: Diagnosis not present

## 2017-02-27 DIAGNOSIS — A419 Sepsis, unspecified organism: Principal | ICD-10-CM | POA: Diagnosis present

## 2017-02-27 DIAGNOSIS — E039 Hypothyroidism, unspecified: Secondary | ICD-10-CM | POA: Diagnosis present

## 2017-02-27 DIAGNOSIS — D703 Neutropenia due to infection: Secondary | ICD-10-CM | POA: Diagnosis present

## 2017-02-27 DIAGNOSIS — D696 Thrombocytopenia, unspecified: Secondary | ICD-10-CM | POA: Diagnosis present

## 2017-02-27 DIAGNOSIS — M810 Age-related osteoporosis without current pathological fracture: Secondary | ICD-10-CM | POA: Diagnosis present

## 2017-02-27 DIAGNOSIS — R1084 Generalized abdominal pain: Secondary | ICD-10-CM | POA: Diagnosis present

## 2017-02-27 DIAGNOSIS — Z9981 Dependence on supplemental oxygen: Secondary | ICD-10-CM

## 2017-02-27 DIAGNOSIS — F0390 Unspecified dementia without behavioral disturbance: Secondary | ICD-10-CM | POA: Diagnosis present

## 2017-02-27 DIAGNOSIS — I482 Chronic atrial fibrillation: Secondary | ICD-10-CM | POA: Diagnosis present

## 2017-02-27 DIAGNOSIS — R601 Generalized edema: Secondary | ICD-10-CM

## 2017-02-27 DIAGNOSIS — E876 Hypokalemia: Secondary | ICD-10-CM | POA: Diagnosis present

## 2017-02-27 DIAGNOSIS — Z9841 Cataract extraction status, right eye: Secondary | ICD-10-CM

## 2017-02-27 DIAGNOSIS — Z96653 Presence of artificial knee joint, bilateral: Secondary | ICD-10-CM | POA: Diagnosis present

## 2017-02-27 DIAGNOSIS — I509 Heart failure, unspecified: Secondary | ICD-10-CM | POA: Diagnosis present

## 2017-02-27 DIAGNOSIS — Z961 Presence of intraocular lens: Secondary | ICD-10-CM | POA: Diagnosis present

## 2017-02-27 DIAGNOSIS — Z880 Allergy status to penicillin: Secondary | ICD-10-CM

## 2017-02-27 DIAGNOSIS — Z823 Family history of stroke: Secondary | ICD-10-CM

## 2017-02-27 DIAGNOSIS — I132 Hypertensive heart and chronic kidney disease with heart failure and with stage 5 chronic kidney disease, or end stage renal disease: Secondary | ICD-10-CM | POA: Diagnosis present

## 2017-02-27 DIAGNOSIS — J181 Lobar pneumonia, unspecified organism: Secondary | ICD-10-CM

## 2017-02-27 DIAGNOSIS — Z8673 Personal history of transient ischemic attack (TIA), and cerebral infarction without residual deficits: Secondary | ICD-10-CM

## 2017-02-27 DIAGNOSIS — Z79890 Hormone replacement therapy (postmenopausal): Secondary | ICD-10-CM

## 2017-02-27 DIAGNOSIS — R6521 Severe sepsis with septic shock: Secondary | ICD-10-CM | POA: Diagnosis present

## 2017-02-27 DIAGNOSIS — R21 Rash and other nonspecific skin eruption: Secondary | ICD-10-CM | POA: Diagnosis present

## 2017-02-27 DIAGNOSIS — R4182 Altered mental status, unspecified: Secondary | ICD-10-CM | POA: Diagnosis not present

## 2017-02-27 DIAGNOSIS — N186 End stage renal disease: Secondary | ICD-10-CM | POA: Diagnosis present

## 2017-02-27 DIAGNOSIS — Z79899 Other long term (current) drug therapy: Secondary | ICD-10-CM

## 2017-02-27 DIAGNOSIS — Z9842 Cataract extraction status, left eye: Secondary | ICD-10-CM

## 2017-02-27 LAB — BLOOD CULTURE ID PANEL (REFLEXED)
Acinetobacter baumannii: NOT DETECTED
CANDIDA ALBICANS: NOT DETECTED
CANDIDA KRUSEI: NOT DETECTED
Candida glabrata: NOT DETECTED
Candida parapsilosis: NOT DETECTED
Candida tropicalis: NOT DETECTED
ENTEROBACTERIACEAE SPECIES: NOT DETECTED
ENTEROCOCCUS SPECIES: NOT DETECTED
ESCHERICHIA COLI: NOT DETECTED
Enterobacter cloacae complex: NOT DETECTED
HAEMOPHILUS INFLUENZAE: NOT DETECTED
Klebsiella oxytoca: NOT DETECTED
Klebsiella pneumoniae: NOT DETECTED
LISTERIA MONOCYTOGENES: NOT DETECTED
Neisseria meningitidis: NOT DETECTED
PSEUDOMONAS AERUGINOSA: NOT DETECTED
Proteus species: NOT DETECTED
SERRATIA MARCESCENS: NOT DETECTED
STAPHYLOCOCCUS AUREUS BCID: NOT DETECTED
STREPTOCOCCUS PNEUMONIAE: NOT DETECTED
STREPTOCOCCUS PYOGENES: NOT DETECTED
STREPTOCOCCUS SPECIES: DETECTED — AB
Staphylococcus species: NOT DETECTED
Streptococcus agalactiae: NOT DETECTED

## 2017-02-27 LAB — PROTIME-INR
INR: 3.14
Prothrombin Time: 32 seconds — ABNORMAL HIGH (ref 11.4–15.2)

## 2017-02-27 LAB — CBC WITH DIFFERENTIAL/PLATELET
BASOS ABS: 0 10*3/uL (ref 0–0.1)
Basophils Relative: 1 %
EOS ABS: 0 10*3/uL (ref 0–0.7)
EOS PCT: 0 %
HCT: 37.7 % (ref 35.0–47.0)
Hemoglobin: 12.1 g/dL (ref 12.0–16.0)
LYMPHS ABS: 0.2 10*3/uL — AB (ref 1.0–3.6)
Lymphocytes Relative: 20 %
MCH: 31.3 pg (ref 26.0–34.0)
MCHC: 32.2 g/dL (ref 32.0–36.0)
MCV: 97.3 fL (ref 80.0–100.0)
Monocytes Absolute: 0.1 10*3/uL — ABNORMAL LOW (ref 0.2–0.9)
Monocytes Relative: 7 %
Neutro Abs: 0.6 10*3/uL — ABNORMAL LOW (ref 1.4–6.5)
Neutrophils Relative %: 72 %
PLATELETS: 74 10*3/uL — AB (ref 150–440)
RBC: 3.88 MIL/uL (ref 3.80–5.20)
RDW: 16.5 % — ABNORMAL HIGH (ref 11.5–14.5)
WBC: 0.8 10*3/uL — AB (ref 3.6–11.0)

## 2017-02-27 LAB — COMPREHENSIVE METABOLIC PANEL
ALK PHOS: 92 U/L (ref 38–126)
ALT: 17 U/L (ref 14–54)
ANION GAP: 18 — AB (ref 5–15)
AST: 63 U/L — ABNORMAL HIGH (ref 15–41)
Albumin: 2.4 g/dL — ABNORMAL LOW (ref 3.5–5.0)
BILIRUBIN TOTAL: 1.4 mg/dL — AB (ref 0.3–1.2)
BUN: 51 mg/dL — ABNORMAL HIGH (ref 6–20)
CALCIUM: 8 mg/dL — AB (ref 8.9–10.3)
CO2: 21 mmol/L — ABNORMAL LOW (ref 22–32)
Chloride: 97 mmol/L — ABNORMAL LOW (ref 101–111)
Creatinine, Ser: 4.71 mg/dL — ABNORMAL HIGH (ref 0.44–1.00)
GFR calc Af Amer: 9 mL/min — ABNORMAL LOW (ref >60)
GFR, EST NON AFRICAN AMERICAN: 7 mL/min — AB (ref >60)
Glucose, Bld: 49 mg/dL — ABNORMAL LOW (ref 65–99)
Potassium: 3.3 mmol/L — ABNORMAL LOW (ref 3.5–5.1)
Sodium: 136 mmol/L (ref 135–145)
TOTAL PROTEIN: 4.8 g/dL — AB (ref 6.5–8.1)

## 2017-02-27 LAB — APTT: aPTT: 46 seconds — ABNORMAL HIGH (ref 24–36)

## 2017-02-27 LAB — TYPE AND SCREEN
ABO/RH(D): O POS
Antibody Screen: NEGATIVE

## 2017-02-27 LAB — TROPONIN I: TROPONIN I: 0.22 ng/mL — AB (ref <0.03)

## 2017-02-27 LAB — LIPASE, BLOOD: Lipase: 23 U/L (ref 11–51)

## 2017-02-27 LAB — LACTIC ACID, PLASMA
LACTIC ACID, VENOUS: 6.2 mmol/L — AB (ref 0.5–1.9)
Lactic Acid, Venous: 4.2 mmol/L (ref 0.5–1.9)

## 2017-02-27 MED ORDER — LEVOFLOXACIN IN D5W 750 MG/150ML IV SOLN
750.0000 mg | Freq: Once | INTRAVENOUS | Status: AC
  Administered 2017-02-27: 750 mg via INTRAVENOUS
  Filled 2017-02-27: qty 150

## 2017-02-27 MED ORDER — VANCOMYCIN HCL 500 MG IV SOLR
500.0000 mg | Freq: Once | INTRAVENOUS | Status: DC
  Filled 2017-02-27: qty 500

## 2017-02-27 MED ORDER — SODIUM CHLORIDE 0.9 % IV BOLUS (SEPSIS): 1000.0000 mL | Freq: Once | INTRAVENOUS | Status: DC

## 2017-02-27 MED ORDER — KETOROLAC TROMETHAMINE 30 MG/ML IJ SOLN
10.0000 mg | INTRAMUSCULAR | Status: AC
Start: 2017-02-27 — End: 2017-02-27
  Administered 2017-02-27: 9.9 mg via INTRAVENOUS
  Filled 2017-02-27: qty 1

## 2017-02-27 MED ORDER — ACETAMINOPHEN 10 MG/ML IV SOLN
1000.0000 mg | Freq: Four times a day (QID) | INTRAVENOUS | Status: DC
  Administered 2017-02-27: 1000 mg via INTRAVENOUS
  Filled 2017-02-27 (×3): qty 100

## 2017-02-27 MED ORDER — SODIUM CHLORIDE 0.9 % IV BOLUS (SEPSIS)
1000.0000 mL | Freq: Once | INTRAVENOUS | Status: AC
  Administered 2017-02-27: 1000 mL via INTRAVENOUS

## 2017-02-27 MED ORDER — IOPAMIDOL (ISOVUE-370) INJECTION 76%
100.0000 mL | Freq: Once | INTRAVENOUS | Status: AC | PRN
  Administered 2017-02-27: 100 mL via INTRAVENOUS

## 2017-02-27 MED ORDER — AZTREONAM 2 G IJ SOLR
2.0000 g | Freq: Once | INTRAMUSCULAR | Status: AC
  Administered 2017-02-27: 2 g via INTRAVENOUS
  Filled 2017-02-27: qty 2

## 2017-02-27 MED ORDER — VANCOMYCIN HCL IN DEXTROSE 1-5 GM/200ML-% IV SOLN
1000.0000 mg | Freq: Once | INTRAVENOUS | Status: AC
  Administered 2017-02-27: 1000 mg via INTRAVENOUS
  Filled 2017-02-27: qty 200

## 2017-02-27 MED ORDER — MORPHINE SULFATE (PF) 2 MG/ML IV SOLN
INTRAVENOUS | Status: AC
  Administered 2017-02-27: 2 mg via INTRAVENOUS
  Filled 2017-02-27: qty 1

## 2017-02-27 MED ORDER — DEXTROSE 50 % IV SOLN
25.0000 g | Freq: Once | INTRAVENOUS | Status: AC
  Administered 2017-02-27: 25 g via INTRAVENOUS
  Filled 2017-02-27: qty 50

## 2017-03-02 LAB — CULTURE, BLOOD (ROUTINE X 2)

## 2017-03-09 ENCOUNTER — Ambulatory Visit: Payer: Medicare Other

## 2017-03-09 ENCOUNTER — Other Ambulatory Visit: Payer: Medicare Other

## 2017-03-09 ENCOUNTER — Ambulatory Visit: Payer: Medicare Other | Admitting: Internal Medicine

## 2017-03-12 ENCOUNTER — Ambulatory Visit (INDEPENDENT_AMBULATORY_CARE_PROVIDER_SITE_OTHER): Payer: Medicare Other | Admitting: Vascular Surgery

## 2017-03-12 ENCOUNTER — Encounter (INDEPENDENT_AMBULATORY_CARE_PROVIDER_SITE_OTHER): Payer: Medicare Other

## 2017-03-16 ENCOUNTER — Other Ambulatory Visit: Payer: Self-pay | Admitting: Nurse Practitioner

## 2017-03-22 IMAGING — CR DG CHEST 2V
1 series · 2 of 2 positions shown · non-contrast
Comparison: 06/28/2014 and 01/03/2014.

CLINICAL DATA: Shortness of breath with weakness for 4 days.
History of atrial fibrillation, stroke and cancer. Initial
encounter.

EXAM:
CHEST  2 VIEW

[Series 1: dg chest 2 view · 0.14mm/px · 2 of 2 slices shown]
[im 1/2]
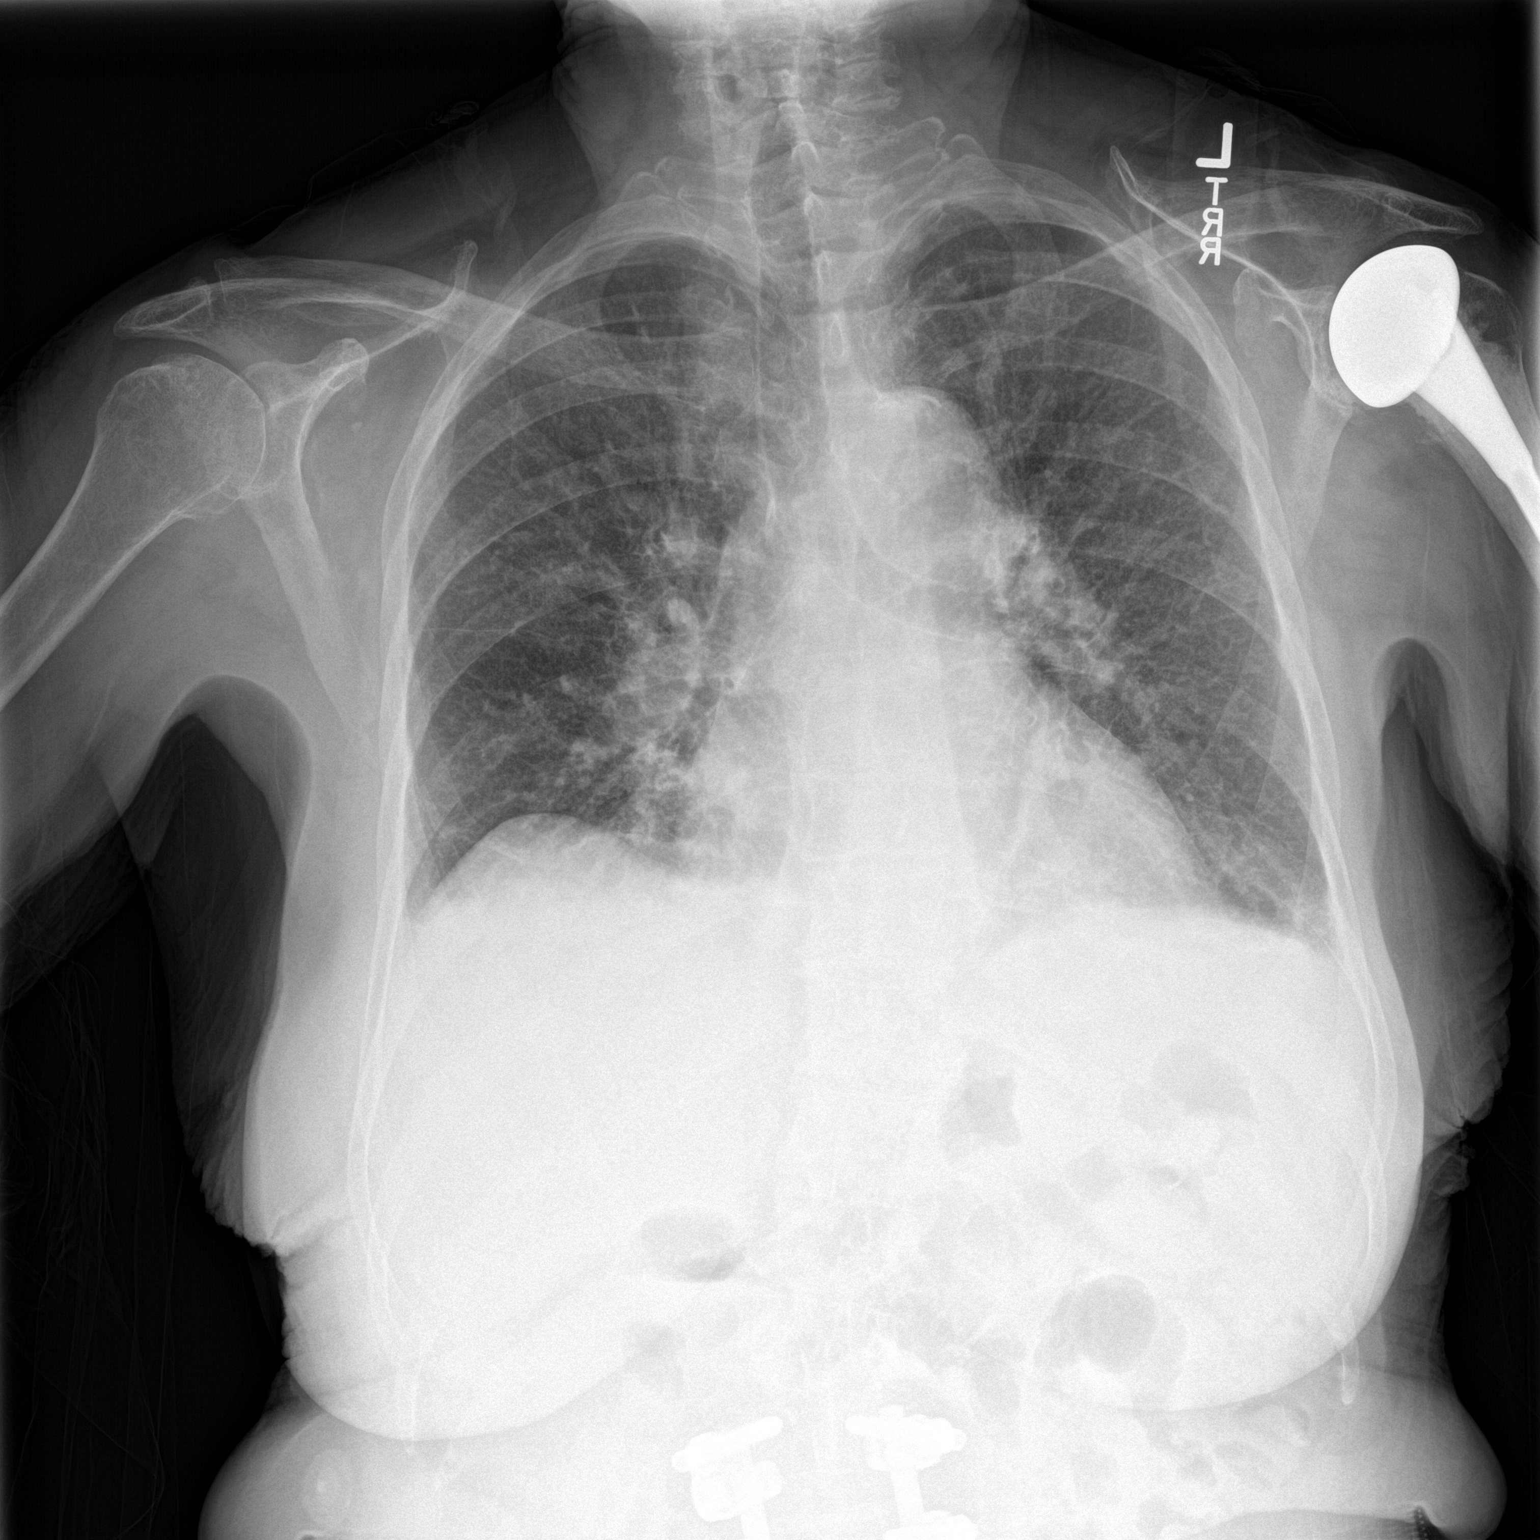
[im 2/2]
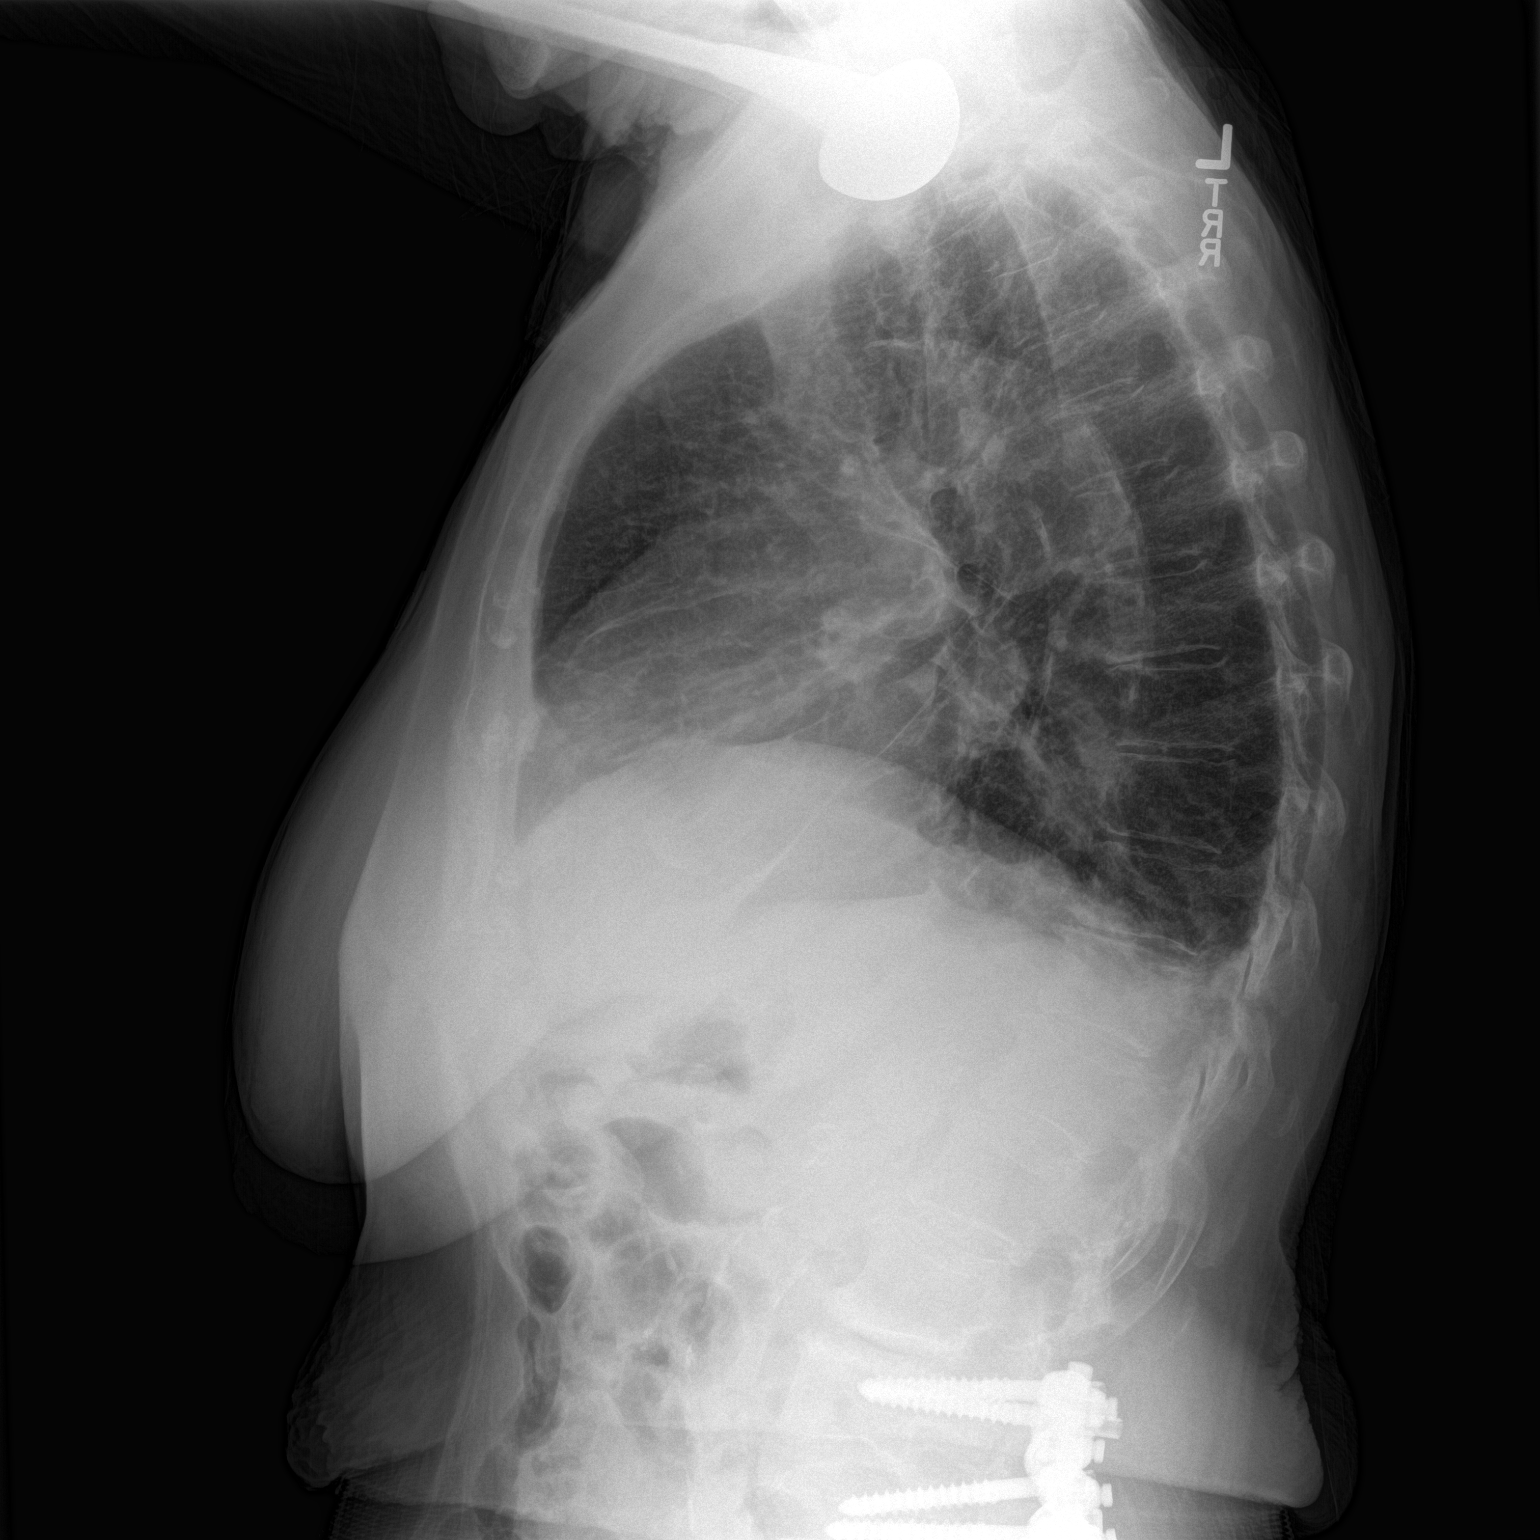

[2 of 2 positions shown; findings below may reference images not displayed]

FINDINGS: The heart size and mediastinal contours are stable with mild aortic
atherosclerosis. There is new vascular congestion with interstitial
pulmonary edema, bibasilar atelectasis and probable small bilateral
pleural effusions. There is no confluent airspace opacity. Patient
is status post left shoulder arthroplasty and lumbar fusion. No
acute osseous findings evident.
IMPRESSION: Interstitial pulmonary edema with small pleural effusions consistent
with congestive heart failure.

## 2017-03-23 NOTE — ED Notes (Addendum)
This RN notified [facility] service at this time. Spoke to [employer] code given was [phone].

## 2017-03-23 NOTE — Discharge Summary (Signed)
Date of admission 03-12-2017 Date of death 03-12-2017  HPI  Kerry Walsh  is a 84 y.o. female with a known history of end-stage renal disease on hemodialysis, chronic congestive heart failure, coronary artery disease, atrial fibrillation and multiple other medical problems is brought into the ED for allergic reaction as family has noticed diffuse skin redness.  Patient is also altered according to the family members Patient was moaning. Pt is  Leukopenic, thrombocytopenic hypotensive and lactic acid is elevated.,  Blood cultures were obtained and patient is started on broad-spectrum IV antibiotics for pneumonia.  Patient was short of breath and also reporting abdominal pain so CT angiogram of the chest, abdomen and pelvis were ordered and results were discussed with the family members, also there is a concern for masslike area in the right side of the abdomen with calcifications.  Patient's daughter was Diane who is healthcare power of attorney and 2 granddaughters who are at bedside has, refused intensive care unit and aggressive measures and requesting strict comfort care measures as patient's clinical situation is deteriorating drastically. family members are leaning towards hospice home  # Sepsis-with septic criteria at the time of admission with elevated lactic acid, neutropenia and severe hypotension #Neutropenia and thrombocytopenia # FTT #Hypokalemia # ESRD #Chronic atrial fibrillation #Congestive heart failure, coronary artery disease  Patient is extremely septic and deteriorating requiring pressors and broad-spectrum IV antibiotics.  Patient has history of end-stage renal disease and is getting ongoing dialysis Patient is clinically not doing fine and patient's daughter who is the healthcare power of attorney has decided to make her strict DNR with comfort care measures.  Requesting hospice care.  Will implement comfort care measures and consult palliative care -Provide her  Tylenol as needed for fever Oxygen as needed for shortness of breath Morphine IV as needed for pain/anxiety/ air hunger  In no time, even before patient was transferred from the emergency department to the hospital bed patient's clinical condition deteriorated drastically and ED RN found her unresponsive.  Patient was diseased and pronounced dead by ED physician at around 12:50 pm.

## 2017-03-23 NOTE — ED Notes (Signed)
This RN received verbal order from Admitting MD to give pt 2mg  of morphine, and begin comfort only care. Pt family is in agreeable as this RN witnessed and reconfirmed this decision.

## 2017-03-23 NOTE — ED Notes (Signed)
Pt placed NRB, pt desat to 85-87% on 5L via [place].

## 2017-03-23 NOTE — Progress Notes (Signed)
Pharmacy Antibiotic Note  Kerry Walsh is a 84 y.o. female admitted on 03-14-2017 with sepsis.  Pharmacy has been consulted for aztreonam, levofloxacin, and vancomycin dosing. Patient has tolerated cephalosporins in the past. Patient's dialysis schedule in October 2018 was Tuesday/Thursday/Saturday.   Plan: Patient received vancomycin 1g IV x 1 in the ED. Will order additional vancomycin 500mg  for total loading dose of vancomycn 1500mg . Will continue patient on vancomycin 750mg  IV to be infused during the last hour of dialysis on dialysis days. Goal trough 15-25. Will obtain trough prior to 3rd maintenance dose.   Will continue patient on aztreonam 500mg  IV Q12hr with next scheduled dose at 2200.   Will continue patient on levofloxacin 500mg  IV Q48hr with next schedule dose on 1/10.   Patient has tolerated cephalosporins on previous admissions. Please consider transitioning patient from aztreonam/levofloxacin to cefepime.     Height: 5' (152.4 cm) Weight: 142 lb (64.4 kg) IBW/kg (Calculated) : 45.5  Temp (24hrs), Avg:97.8 F (36.6 C), Min:97.8 F (36.6 C), Max:97.8 F (36.6 C)  Recent Labs  Lab March 14, 2017 0856 Mar 14, 2017 0857  WBC 0.8*  --   CREATININE 4.71*  --   LATICACIDVEN  --  6.2*    Estimated Creatinine Clearance: 6.8 mL/min (A) (by C-G formula based on SCr of 4.71 mg/dL (H)).    Allergies  Allergen Reactions  . Penicillins Rash    Has patient had a PCN reaction causing immediate rash, facial/tongue/throat swelling, SOB or lightheadedness with hypotension: Yes Has patient had a PCN reaction causing severe rash involving mucus membranes or skin necrosis: No Has patient had a PCN reaction that required hospitalization No Has patient had a PCN reaction occurring within the last 10 years: No If all of the above answers are "NO", then may proceed with Cephalosporin use.     Antimicrobials this admission: Vancomycin 1/8 >>  Aztreonam 1/8 >>  Levofloxacin 1/8 >>  Dose  adjustments this admission: N/A  Microbiology results: 1/8 BCx: pending  1/8 UCx: pending   Thank you for allowing pharmacy to be a part of this patient's care.  Batsheva Stevick L 2017/03/14 11:00 AM

## 2017-03-23 NOTE — H&P (Signed)
[facility] [place] NAME: Kerry Walsh    MR#:  [id number]  DATE OF BIRTH:  06-28-1925  DATE OF ADMISSION:  Mar 18, 2017  PRIMARY CARE PHYSICIAN: [facility]   REQUESTING/REFERRING PHYSICIAN: Carrie Mew, MD  CHIEF COMPLAINT:  Altered Mental Status and Rash    HISTORY OF PRESENT ILLNESS:  Kerry Walsh  is a 84 y.o. female with a known history of end-stage renal disease on hemodialysis, chronic congestive heart failure, coronary artery disease, atrial fibrillation and multiple other medical problems is brought into the ED for allergic reaction as family has noticed diffuse skin redness.  Patient is also altered according to the family members Patient was moaning. Pt is  Leukopenic, thrombocytopenic hypotensive and lactic acid is elevated.,  Blood cultures were obtained and patient is started on broad-spectrum IV antibiotics for pneumonia.  Patient was short of breath and also reporting abdominal pain so CT angiogram of the chest, abdomen and pelvis were ordered and results were discussed with the family members, also there is a concern for masslike area in the right side of the abdomen with calcifications.  Patient's daughter was Diane who is healthcare power of attorney and 2 granddaughters who are at bedside has, refused intensive care unit and aggressive measures and requesting strict comfort care measures as patient's clinical situation is deteriorating drastically.  We are also leaning towards hospice home  PAST MEDICAL HISTORY:   Past Medical History:  Diagnosis Date  . Anemia   . Anxiety   . Arthritis   . Atrial fibrillation ([place])   . Cancer [facility])    Carcinoid Tumor  . CHF (congestive heart failure) ([place])   . Chronic kidney disease    CKD with last known creatinine fo 2.4 in Dec 2015  . Coronary artery disease   . Dementia   . Depression   . Dialysis patient [facility])    Mon. Wed. Fri.  . Dysrhythmia     Atrial Fibrillation  . Hypertension   . Hypothyroidism   . Malignant poorly differentiated neuroendocrine carcinoma ([place]) 08/12/2014  . Mitral valve regurgitation   . Osteoporosis   . Stroke ([place])    No residual neurological deficits    PAST SURGICAL HISTOIRY:   Past Surgical History:  Procedure Laterality Date  . ABDOMINAL HYSTERECTOMY    . AV FISTULA PLACEMENT Left 01/19/2016   Procedure: INSERTION OF ARTERIOVENOUS (AV) GORE-TEX GRAFT ARM ( BRACH / AXILLARY );  Surgeon: Katha Cabal, MD;  Location: [facility] ORS;  Service: Vascular;  Laterality: Left;  . BACK SURGERY    . CHOLECYSTECTOMY    . EYE SURGERY Bilateral    Cataract Extraction with IOL  . JOINT REPLACEMENT Bilateral    Knee replacement surgery- bilateral  . PERIPHERAL VASCULAR CATHETERIZATION N/A 07/27/2015   Procedure: Dialysis/Perma Catheter Insertion;  Surgeon: Katha Cabal, MD;  Location: [place] [facility] LAB;  Service: Cardiovascular;  Laterality: N/A;  . PERIPHERAL VASCULAR CATHETERIZATION N/A 03/21/2016   Procedure: Dialysis/Perma Catheter Removal;  Surgeon: Katha Cabal, MD;  Location: [place] [facility];  Service: Cardiovascular;  Laterality: N/A;  . SHOULDER SURGERY Bilateral     SOCIAL HISTORY:   Social History   Tobacco Use  . Smoking status: Never Smoker  . Smokeless tobacco: Never Used  Substance Use Topics  . Alcohol use: No    FAMILY HISTORY:   Family History  Problem Relation Age of Onset  . CAD Father   . Stroke  Father   . Bladder Cancer Father   . Kidney failure Mother   . Kidney cancer Neg Hx     DRUG ALLERGIES:   Allergies  Allergen Reactions  . Penicillins Rash    Has patient had a PCN reaction causing immediate rash, facial/tongue/throat swelling, SOB or lightheadedness with hypotension: Yes Has patient had a PCN reaction causing severe rash involving mucus membranes or skin necrosis: No Has patient had a PCN reaction that required hospitalization No Has patient  had a PCN reaction occurring within the last 10 years: No If all of the above answers are "NO", then may proceed with Cephalosporin use.     REVIEW OF SYSTEMS:  Review of system unobtainable as patient is altered  MEDICATIONS AT HOME:   Prior to Admission medications   Medication Sig Start Date End Date Taking? Authorizing Provider  acetaminophen (TYLENOL) 325 MG tablet Take 650 mg by mouth 4 (four) times daily as needed for mild pain or moderate pain.   Yes [provider]  amLODipine (NORVASC) 5 MG tablet Take 5 mg by mouth every morning.    Yes [provider]  calcitRIOL (ROCALTROL) 0.25 MCG capsule Take 0.25 mcg by mouth daily.   Yes [provider]  Colloidal Oatmeal (EUCERIN ECZEMA RELIEF EX) Apply 1 application topically 2 (two) times daily. Apply to feet and legs   Yes [provider]  ipratropium (ATROVENT) 0.06 % nasal spray Place 2 sprays into both nostrils 4 (four) times daily.   Yes [provider]  levothyroxine (SYNTHROID, LEVOTHROID) 88 MCG tablet Take 88 mcg by mouth daily before breakfast.  10/23/15  Yes [provider]  lidocaine (LIDODERM) 5 % Place 1 patch onto the skin daily. Remove & Discard patch within 12 hours or as directed by MD   Yes [provider]  metoprolol (LOPRESSOR) 50 MG tablet Take 1 tablet (50 mg total) by mouth 2 (two) times daily. 09/28/14  Yes Wieting, Richard, MD  sevelamer (RENAGEL) 800 MG tablet Take by mouth 3 (three) times daily with meals.   Yes [provider]  torsemide (DEMADEX) 20 MG tablet Take 2 tablets (40 mg total) by mouth daily. 12/13/16  Yes Demetrios Loll, MD  warfarin (COUMADIN) 2 MG tablet Take 1 tablet (2 mg total) by mouth daily at 6 PM. 12/16/16  Yes Vaughan Basta, MD  traMADol (ULTRAM) 50 MG tablet Take 50 mg by mouth every 4 (four) hours as needed for moderate pain.    [provider]      VITAL SIGNS:  Blood pressure (!) 81/41, pulse 76,  temperature 97.8 F (36.6 C), temperature source Oral, resp. rate (!) 21, height 5' (1.524 m), weight 64.4 kg (142 lb), SpO2 100 %.  PHYSICAL EXAMINATION:  GENERAL:  84 y.o.-year-old patient lying in the bed with no acute distress.  EYES: Pupils equal, round, reactive to light and accommodation. No scleral icterus. HEENT: Head atraumatic, normocephalic. Oropharynx and nasopharynx clear.  NECK:  Supple, no jugular venous distention. No thyroid enlargement, no tenderness.  LUNGS: Diminished breath sounds bilaterally, no wheezing, has diffuse rales,rhonchi or crepitation. No use of accessory muscles of respiration.on NRB  CARDIOVASCULAR: Irregularly irregular ABDOMEN: Soft, right lower quadrant is tender, nondistended. Bowel sounds present.  EXTREMITIES: No pedal edema, cyanosis, or clubbing.  NEUROLOGIC: Patient is arousable but disoriented PSYCHIATRIC: The patient is arousable but disoriented .  Moaning with discomfort SKIN: Diffuse ecchymosis LABORATORY PANEL:   CBC Recent Labs  Lab March 18, 2017 0856  WBC  0.8*  HGB 12.1  HCT 37.7  PLT 74*   ------------------------------------------------------------------------------------------------------------------  Chemistries  Recent Labs  Lab 2017/03/26 0856  NA 136  K 3.3*  CL 97*  CO2 21*  GLUCOSE 49*  BUN 51*  CREATININE 4.71*  CALCIUM 8.0*  AST 63*  ALT 17  ALKPHOS 92  BILITOT 1.4*   ------------------------------------------------------------------------------------------------------------------  Cardiac Enzymes Recent Labs  Lab 03-26-17 0856  TROPONINI 0.22*   ------------------------------------------------------------------------------------------------------------------  RADIOLOGY:  Dg Chest Port 1 View  Result Date: 03-26-2017 CLINICAL DATA:  Altered mental status. EXAM: PORTABLE CHEST 1 VIEW COMPARISON:  Chest x-ray dated December 18, 2016. FINDINGS: Stable cardiomegaly. Mild vascular congestion and interstitial  edema. Layering moderate right pleural effusion with patchy density at the right lung base. No pneumothorax. No acute osseous abnormality. IMPRESSION: 1. Stable cardiomegaly and interstitial pulmonary edema. New layering moderate right pleural effusion with right basilar atelectasis versus infiltrate. Electronically Signed   By: Titus Dubin M.D.   On: 03/26/17 09:56   Ct Angio Chest/abd/pel For Dissection W And/or W/wo  Result Date: March 26, 2017 CLINICAL DATA:  Chest and abdominal pain hypotension EXAM: CT ANGIOGRAPHY CHEST, ABDOMEN AND PELVIS TECHNIQUE: Initially, axial CT images were obtained through the chest without intravenous contrast material administration. Multidetector CT imaging through the chest, abdomen and pelvis was performed using the standard protocol during bolus administration of intravenous contrast. Multiplanar reconstructed images and MIPs were obtained and reviewed to evaluate the vascular anatomy. CONTRAST:  138mL ISOVUE-370 IOPAMIDOL (ISOVUE-370) INJECTION 76% COMPARISON:  CT abdomen and pelvis September 14, 2016. Chest radiograph December 18, 2016 FINDINGS: CTA CHEST FINDINGS Cardiovascular: There is no intramural hematoma appreciable on the noncontrast enhanced images. There is no appreciable thoracic aortic aneurysm or dissection. There is mild calcification in the proximal left subclavian artery. Other visualized great vessels appear unremarkable. There is atherosclerotic calcification aorta. There are foci of coronary artery calcification. There is no demonstrable pulmonary embolus. There is no appreciable pericardial thickening. There is a small pericardial effusion anteriorly and inferiorly. Mediastinum/Nodes: Thyroid is mildly inhomogeneous without dominant mass. There is no appreciable thoracic adenopathy. No esophageal lesions are appreciable. Lungs/Pleura: There are pleural effusions bilaterally, free-flowing, larger on the right than on the left. There is consolidation and  atelectasis in both lung bases. There is also consolidation in the right middle lobe. Musculoskeletal: There is anasarca diffusely. There are no blastic or lytic bone lesions. Review of the MIP images confirms the above findings. CTA ABDOMEN AND PELVIS FINDINGS VASCULAR Aorta: There is no abdominal aortic aneurysm or dissection. There is atherosclerotic calcification throughout most of the abdominal aorta without hemodynamically significant obstructing lesion. Celiac: Celiac artery and major mesenteric branches are patent, although there is calcification in portions of the splenic artery. No aneurysm or dissection. SMA: Superior mesenteric artery and its major branches are patent without aneurysm or dissection. Renals: There is a single renal artery on each side. There is moderate atherosclerotic calcification at the origin of the right renal artery. No aneurysm or dissection is seen involving either renal artery or its branches. There is no appreciable fibromuscular dysplasia. IMA: The inferior mesenteric artery is diminutive but patent. No aneurysm or dissection. Major branches of the inferior mesenteric artery appear patent. Inflow: There is tortuosity in the proximal common iliac arteries, more severe on the right than on the left. There is mild atherosclerotic calcification in each common iliac artery. Major pelvic arterial vessels are patent. No aneurysm or dissection. Note that there is a degree of motion artifact in  the pelvic region. Veins: No obvious venous abnormality within the limitations of this arterial phase study. Review of the MIP images confirms the above findings. NON-VASCULAR Hepatobiliary: No focal liver lesions are appreciable. Gallbladder absent. No appreciable biliary duct dilatation. Pancreas: No pancreatic mass or inflammatory focus. Spleen: No splenic lesions are evident. Adrenals/Urinary Tract: Adrenals appear unremarkable. There are cysts arising from each kidney. Largest cyst on the  right is seen medially measuring 1.8 x 1.8 cm. Largest cyst on the left is seen arising laterally from the left kidney measuring 5.8 x 5.6 cm. A cyst posteriorly arising from the left kidney measures 2.7 x 2.7 cm. There is no hydronephrosis on either side. There is no renal or ureteral calculus on either side. Urinary bladder is midline with wall thickness within normal limits. Stomach/Bowel: Rectum is mildly distended with stool. There is no perirectal wall thickening. There are sigmoid diverticula without diverticulitis. There is no evident bowel obstruction. No free air or portal venous air. Lymphatic: In the right lower abdomen to the right of the aorta, there is a 4.5 x 2.7 cm masslike area containing calcification. It is difficult to ascertain whether this area represents localized adenopathy versus a mass arising from the abdominal mesentery. This lesion was not appreciable on prior CT examination from July 2018. No other evidence suggesting adenopathy. Reproductive: Uterus absent.  No pelvic mass evident. Other: There is moderate ascites throughout the abdomen and pelvis. There is generalized anasarca. No abscess evident. No periappendiceal region inflammation. The appendix appears unremarkable. Musculoskeletal: There is postoperative change in the lower lumbar and sacral regions. Bones are osteoporotic. No blastic or lytic bone lesions are evident. There is no intramuscular lesion. There is a total hip replacement on the left Review of the MIP images confirms the above findings. IMPRESSION: CT angiogram chest: 1. No thoracic aortic aneurysm or dissection. No pulmonary embolus. 2. Foci of atherosclerotic calcification in the aorta and proximal left subclavian artery. There are foci of coronary artery calcification. 3. Free-flowing pleural effusions, larger on the right than on the left. Bibasilar consolidation and atelectasis. Patchy consolidation right middle lobe noted. 4.  Small pericardial effusion. 5.   Generalized anasarca. 6. No adenopathy. Probable multinodular goiter without dominant mass. CT angiogram abdomen; CT angiogram pelvis: 1. There is a mass in the right lower abdomen containing irregular calcification measuring 4.5 x 2.7 cm. This lesion was not appreciable on prior CT from July 2018. Question localized adenopathy versus a mass arising from the mesentery. A neoplastic etiology must be of concern. A carcinoid tumor could present in this manner. Appropriate laboratory correlation in this regard advised. 2. Areas of aortic atherosclerosis. No hemodynamically significant obstruction seen in the aorta or major mesenteric vessels. No aneurysm or dissection is seen in the aorta, major pelvic, or visualized mesenteric arterial vessels. 3.  Moderate ascites in the abdomen and pelvis.  Diffuse anasarca. 4. Sigmoid diverticulosis without diverticulitis. No bowel obstruction. No abscess. Appendix region appears normal. 5.  Uterus absent.  Gallbladder absent. 6. Postoperative change in the lower lumbar and upper sacral regions. Aortic Atherosclerosis (ICD10-I70.0). Electronically Signed   By: Lowella Grip III M.D.   On: Mar 01, 2017 09:53    EKG:   Orders placed or performed during the hospital encounter of 03-01-17  . EKG 12-Lead  . EKG 12-Lead    IMPRESSION AND PLAN:     # Sepsis-with septic criteria at the time of admission with elevated lactic acid, neutropenia and severe hypotension #Neutropenia and thrombocytopenia # FTT #  Hypokalemia # ESRD #Chronic atrial fibrillation #Congestive heart failure, coronary artery disease  Patient is extremely septic and deteriorating requiring pressors and broad-spectrum IV antibiotics.  Patient has history of end-stage renal disease and is getting ongoing dialysis Patient is clinically not doing fine and patient's daughter who is the healthcare power of attorney has decided to make her strict DNR with comfort care measures.  Requesting hospice care.   Will implement comfort care measures and consult palliative care -Provide her Tylenol as needed for fever Oxygen as needed for shortness of breath Morphine IV as needed for pain/anxiety/ air hunger    All the records are reviewed and case discussed with ED provider. Management plans discussed with the patient's daughter Gretchen Portela and 2 grand daughters at bedside and they are in agreement.  CODE STATUS: DNR ,comfort care ; daughter [name] is HCPOA  TOTAL TIME TAKING CARE OF THIS PATIENT: 45  minutes.   Note: This dictation was prepared with Dragon dictation along with smaller phrase technology. Any transcriptional errors that result from this process are unintentional.  Nicholes Mango M.D on 03-02-2017 at 11:45 AM  Between 7am to 6pm - Pager - [phone]  After 6pm go to [email or web address] - password [email or web address] [facility]  [facility]  Office  [phone]  CC: Primary care physician; [facility]

## 2017-03-23 NOTE — ED Notes (Signed)
Date and time results received: Mar 09, 2017 9:56 AM  (use smartphrase ".now" to insert current time)  Test: Lactic, Troponin Critical Value: Lactic 6.2, Trop 0.22  Name of Provider Notified: Dr. Joni Fears  Orders Received? Or Actions Taken?: Dr. Joni Fears at bedside at this time. See orders for details.

## 2017-03-23 NOTE — ED Provider Notes (Signed)
[facility] Emergency Department Provider Note  ____________________________________________  Time seen: Approximately 9:28 AM  I have reviewed the triage vital signs and the nursing notes.   HISTORY  Chief Complaint Altered Mental Status and Rash  Level 5 Caveat: Portions of the History and Physical were unable to be obtained due to altered mental status.   HPI Kerry Walsh is a 84 y.o. female brought to the ED due to diffuse skin redness. There is concern for allergic reaction without any known exposure, so patient was given Benadryl by EMS. Patient is on warfarin. No known trauma. No recent illnesses. Patient is on chronic 2 L nasal cannula. Patient is reported to be normally alert and oriented but currently is confused and unable to provide much history.  Patient reports generalized abdominal pain.   Past Medical History:  Diagnosis Date  . Anemia   . Anxiety   . Arthritis   . Atrial fibrillation ([place])   . Cancer [facility])    Carcinoid Tumor  . CHF (congestive heart failure) ([place])   . Chronic kidney disease    CKD with last known creatinine fo 2.4 in Dec 2015  . Coronary artery disease   . Dementia   . Depression   . Dialysis patient [facility])    Mon. Wed. Fri.  . Dysrhythmia    Atrial Fibrillation  . Hypertension   . Hypothyroidism   . 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 Mitral valve regurgitation   . Osteoporosis   . Stroke [facility])    No residual neurological deficits     Patient Active Problem List   Diagnosis Date Noted  . Sepsis ([place]) 03/27/2017  . Pulmonary edema 12/14/2016  . Acute on chronic diastolic heart failure ([place]) 12/10/2016  . Hyperlipidemia 02/29/2016  . ESRD on dialysis ([place]) 02/29/2016  . 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 MI (mitral incompetence) 12/25/2014  . Chronic systolic heart failure ([place]) 10/01/2014  . Elevated troponin 09/25/2014  . HTN  (hypertension) 09/25/2014  . Acute CHF ([place]) 09/25/2014  . Essential (primary) hypertension 09/25/2014  . Dizziness 06/28/2014  . A-fib ([place]) 06/28/2014  . Dizziness and giddiness 06/28/2014  . Osteoporosis, post-menopausal 03/09/2014  . ADH disorder ([place]) 01/23/2014  . Disorder of posterior pituitary ([place]) 01/23/2014  . Syndrome of inappropriate secretion of antidiuretic hormone ([place]) 01/23/2014  . Infected prosthetic knee joint ([place]) 01/12/2014  . Infection and inflammatory reaction due to internal prosthetic device, implant, and graft 01/12/2014  . Arteriosclerosis of coronary artery 08/23/2013  . Hypothyroid 08/23/2013     Past Surgical History:  Procedure Laterality Date  . ABDOMINAL HYSTERECTOMY    . AV FISTULA PLACEMENT Left 01/19/2016   Procedure: INSERTION OF ARTERIOVENOUS (AV) GORE-TEX GRAFT ARM ( BRACH / AXILLARY );  Surgeon: Katha Cabal, MD;  Location: [facility] ORS;  Service: Vascular;  Laterality: Left;  . BACK SURGERY    . CHOLECYSTECTOMY    . EYE SURGERY Bilateral    Cataract Extraction with IOL  . JOINT REPLACEMENT Bilateral    Knee replacement surgery- bilateral  . PERIPHERAL VASCULAR CATHETERIZATION N/A 07/27/2015   Procedure: Dialysis/Perma Catheter Insertion;  Surgeon: Katha Cabal, MD;  Location: [facility];  Service: Cardiovascular;  Laterality: N/A;  . PERIPHERAL VASCULAR CATHETERIZATION N/A 03/21/2016   Procedure: Dialysis/Perma Catheter Removal;  Surgeon: Katha Cabal, MD;  Location: [facility];  Service: Cardiovascular;  Laterality: N/A;  . SHOULDER SURGERY Bilateral  Prior to Admission medications   Medication Sig Start Date End Date Taking? Authorizing Provider  acetaminophen (TYLENOL) 325 MG tablet Take 650 mg by mouth 4 (four) times daily as needed for mild pain or moderate pain.   Yes [provider]  amLODipine (NORVASC) 5 MG tablet Take 5 mg by mouth every morning.    Yes [provider]   calcitRIOL (ROCALTROL) 0.25 MCG capsule Take 0.25 mcg by mouth daily.   Yes [provider]  Colloidal Oatmeal (EUCERIN ECZEMA RELIEF EX) Apply 1 application topically 2 (two) times daily. Apply to feet and legs   Yes [provider]  ipratropium (ATROVENT) 0.06 % nasal spray Place 2 sprays into both nostrils 4 (four) times daily.   Yes [provider]  levothyroxine (SYNTHROID, LEVOTHROID) 88 MCG tablet Take 88 mcg by mouth daily before breakfast.  10/23/15  Yes [provider]  lidocaine (LIDODERM) 5 % Place 1 patch onto the skin daily. Remove & Discard patch within 12 hours or as directed by MD   Yes [provider]  metoprolol (LOPRESSOR) 50 MG tablet Take 1 tablet (50 mg total) by mouth 2 (two) times daily. 09/28/14  Yes Wieting, Richard, MD  sevelamer (RENAGEL) 800 MG tablet Take by mouth 3 (three) times daily with meals.   Yes [provider]  torsemide (DEMADEX) 20 MG tablet Take 2 tablets (40 mg total) by mouth daily. 12/13/16  Yes Demetrios Loll, MD  warfarin (COUMADIN) 2 MG tablet Take 1 tablet (2 mg total) by mouth daily at 6 PM. 12/16/16  Yes Vaughan Basta, MD  traMADol (ULTRAM) 50 MG tablet Take 50 mg by mouth every 4 (four) hours as needed for moderate pain.    [provider]     Allergies Penicillins   Family History  Problem Relation Age of Onset  . CAD Father   . Stroke Father   . Bladder Cancer Father   . Kidney failure Mother   . Kidney cancer Neg Hx     Social History Social History   Tobacco Use  . Smoking status: Never Smoker  . Smokeless tobacco: Never Used  Substance Use Topics  . Alcohol use: No  . Drug use: No    Review of Systems Unable to reliably obtained due to altered mental status ____________________________________________   PHYSICAL EXAM:  VITAL SIGNS: ED Triage Vitals  Enc Vitals Group     BP 2017-03-23 0856 (!) 89/43     Pulse Rate 03-23-2017 0856 85     Resp 03-23-2017  0856 (!) 37     Temp 2017-03-23 0856 97.8 F (36.6 C)     Temp Source 2017-03-23 0856 Oral     SpO2 March 23, 2017 0845 92 %     Weight 03/23/17 0853 142 lb (64.4 kg)     Height 03-23-2017 0853 5' (1.524 m)     Head Circumference --      Peak Flow --      Pain Score --      Pain Loc --      Pain Edu? --      Excl. in [place]? --     Vital signs reviewed, nursing assessments reviewed.   Constitutional:   Awake, not oriented. Ill-appearing, moderate distress. Frequent moaning from pain Eyes:   No scleral icterus.  EOMI. No nystagmus. No conjunctival pallor. PERRL. ENT   Head:   Normocephalic and atraumatic.   Nose:   No congestion/rhinnorhea.    Mouth/Throat:   Dry  mucous membranes, no pharyngeal erythema. No peritonsillar mass.    Neck:   No meningismus. Full ROM. Hematological/Lymphatic/Immunilogical:   No cervical lymphadenopathy. Cardiovascular:   RRR. Symmetric bilateral radial and DP pulses.  No murmurs.  Respiratory:   Tachypnea. Symmetric breath sounds, no wheezing or crackles noted. Gastrointestinal:   Soft and nontender. Non distended. There is no CVA tenderness.  No rebound, rigidity, or guarding. Diffuse ecchymosis of the abdominal wall. No black or bloody Genitourinary:   Unremarkable except for ecchymosis and swelling over the mons pubis Musculoskeletal:   Normal range of motion in all extremities. No joint effusions.  No lower extremity tenderness.  No edema. Neurologic:   Minimal language expression.  Motor grossly intact. No gross focal neurologic deficits are appreciated.  Skin:    Skin is warm, dry and intact. Diffuse ecchymotic rash as noted above. No petechia purpura or bullae. No obvious soft tissue pressure injuries.  ____________________________________________    LABS (pertinent positives/negatives) (all labs ordered are listed, but only abnormal results are displayed) Labs Reviewed  COMPREHENSIVE METABOLIC PANEL - Abnormal; Notable for the following  components:      Result Value   Potassium 3.3 (*)    Chloride 97 (*)    CO2 21 (*)    Glucose, Bld 49 (*)    BUN 51 (*)    Creatinine, Ser 4.71 (*)    Calcium 8.0 (*)    Total Protein 4.8 (*)    Albumin 2.4 (*)    AST 63 (*)    Total Bilirubin 1.4 (*)    GFR calc non Af Amer 7 (*)    GFR calc Af Amer 9 (*)    Anion gap 18 (*)    All other components within normal limits  TROPONIN I - Abnormal; Notable for the following components:   Troponin I 0.22 (*)    All other components within normal limits  CBC WITH DIFFERENTIAL/PLATELET - Abnormal; Notable for the following components:   WBC 0.8 (*)    RDW 16.5 (*)    Platelets 74 (*)    Neutro Abs 0.6 (*)    Lymphs Abs 0.2 (*)    Monocytes Absolute 0.1 (*)    All other components within normal limits  LACTIC ACID, PLASMA - Abnormal; Notable for the following components:   Lactic Acid, Venous 6.2 (*)    All other components within normal limits  PROTIME-INR - Abnormal; Notable for the following components:   Prothrombin Time 32.0 (*)    All other components within normal limits  APTT - Abnormal; Notable for the following components:   aPTT 46 (*)    All other components within normal limits  CULTURE, BLOOD (ROUTINE X 2)  CULTURE, BLOOD (ROUTINE X 2)  URINE CULTURE  LIPASE, BLOOD  LACTIC ACID, PLASMA  URINALYSIS, COMPLETE (UACMP) WITH MICROSCOPIC  URINALYSIS, ROUTINE W REFLEX MICROSCOPIC  TYPE AND SCREEN    ____________________________________________   EKG  Interpreted by me Sinus rhythm rate of 85, left axis, left bundle branch block. No acute ischemic changes.  ____________________________________________    RADIOLOGY  Dg Chest Port 1 View  Result Date: 03-05-17 CLINICAL DATA:  Altered mental status. EXAM: PORTABLE CHEST 1 VIEW COMPARISON:  Chest x-ray dated December 18, 2016. FINDINGS: Stable cardiomegaly. Mild vascular congestion and interstitial edema. Layering moderate right pleural effusion with patchy  density at the right lung base. No pneumothorax. No acute osseous abnormality. IMPRESSION: 1. Stable cardiomegaly and interstitial pulmonary edema. New layering moderate right pleural  effusion with right basilar atelectasis versus infiltrate. Electronically Signed   By: Titus Dubin M.D.   On: March 21, 2017 09:56   Ct Angio Chest/abd/pel For Dissection W And/or W/wo  Result Date: 03/21/2017 CLINICAL DATA:  Chest and abdominal pain hypotension EXAM: CT ANGIOGRAPHY CHEST, ABDOMEN AND PELVIS TECHNIQUE: Initially, axial CT images were obtained through the chest without intravenous contrast material administration. Multidetector CT imaging through the chest, abdomen and pelvis was performed using the standard protocol during bolus administration of intravenous contrast. Multiplanar reconstructed images and MIPs were obtained and reviewed to evaluate the vascular anatomy. CONTRAST:  153mL ISOVUE-370 IOPAMIDOL (ISOVUE-370) INJECTION 76% COMPARISON:  CT abdomen and pelvis September 14, 2016. Chest radiograph December 18, 2016 FINDINGS: CTA CHEST FINDINGS Cardiovascular: There is no intramural hematoma appreciable on the noncontrast enhanced images. There is no appreciable thoracic aortic aneurysm or dissection. There is mild calcification in the proximal left subclavian artery. Other visualized great vessels appear unremarkable. There is atherosclerotic calcification aorta. There are foci of coronary artery calcification. There is no demonstrable pulmonary embolus. There is no appreciable pericardial thickening. There is a small pericardial effusion anteriorly and inferiorly. Mediastinum/Nodes: Thyroid is mildly inhomogeneous without dominant mass. There is no appreciable thoracic adenopathy. No esophageal lesions are appreciable. Lungs/Pleura: There are pleural effusions bilaterally, free-flowing, larger on the right than on the left. There is consolidation and atelectasis in both lung bases. There is also consolidation in  the right middle lobe. Musculoskeletal: There is anasarca diffusely. There are no blastic or lytic bone lesions. Review of the MIP images confirms the above findings. CTA ABDOMEN AND PELVIS FINDINGS VASCULAR Aorta: There is no abdominal aortic aneurysm or dissection. There is atherosclerotic calcification throughout most of the abdominal aorta without hemodynamically significant obstructing lesion. Celiac: Celiac artery and major mesenteric branches are patent, although there is calcification in portions of the splenic artery. No aneurysm or dissection. SMA: Superior mesenteric artery and its major branches are patent without aneurysm or dissection. Renals: There is a single renal artery on each side. There is moderate atherosclerotic calcification at the origin of the right renal artery. No aneurysm or dissection is seen involving either renal artery or its branches. There is no appreciable fibromuscular dysplasia. IMA: The inferior mesenteric artery is diminutive but patent. No aneurysm or dissection. Major branches of the inferior mesenteric artery appear patent. Inflow: There is tortuosity in the proximal common iliac arteries, more severe on the right than on the left. There is mild atherosclerotic calcification in each common iliac artery. Major pelvic arterial vessels are patent. No aneurysm or dissection. Note that there is a degree of motion artifact in the pelvic region. Veins: No obvious venous abnormality within the limitations of this arterial phase study. Review of the MIP images confirms the above findings. NON-VASCULAR Hepatobiliary: No focal liver lesions are appreciable. Gallbladder absent. No appreciable biliary duct dilatation. Pancreas: No pancreatic mass or inflammatory focus. Spleen: No splenic lesions are evident. Adrenals/Urinary Tract: Adrenals appear unremarkable. There are cysts arising from each kidney. Largest cyst on the right is seen medially measuring 1.8 x 1.8 cm. Largest cyst on  the left is seen arising laterally from the left kidney measuring 5.8 x 5.6 cm. A cyst posteriorly arising from the left kidney measures 2.7 x 2.7 cm. There is no hydronephrosis on either side. There is no renal or ureteral calculus on either side. Urinary bladder is midline with wall thickness within normal limits. Stomach/Bowel: Rectum is mildly distended with stool. There is no perirectal wall  thickening. There are sigmoid diverticula without diverticulitis. There is no evident bowel obstruction. No free air or portal venous air. Lymphatic: In the right lower abdomen to the right of the aorta, there is a 4.5 x 2.7 cm masslike area containing calcification. It is difficult to ascertain whether this area represents localized adenopathy versus a mass arising from the abdominal mesentery. This lesion was not appreciable on prior CT examination from July 2018. No other evidence suggesting adenopathy. Reproductive: Uterus absent.  No pelvic mass evident. Other: There is moderate ascites throughout the abdomen and pelvis. There is generalized anasarca. No abscess evident. No periappendiceal region inflammation. The appendix appears unremarkable. Musculoskeletal: There is postoperative change in the lower lumbar and sacral regions. Bones are osteoporotic. No blastic or lytic bone lesions are evident. There is no intramuscular lesion. There is a total hip replacement on the left Review of the MIP images confirms the above findings. IMPRESSION: CT angiogram chest: 1. No thoracic aortic aneurysm or dissection. No pulmonary embolus. 2. Foci of atherosclerotic calcification in the aorta and proximal left subclavian artery. There are foci of coronary artery calcification. 3. Free-flowing pleural effusions, larger on the right than on the left. Bibasilar consolidation and atelectasis. Patchy consolidation right middle lobe noted. 4.  Small pericardial effusion. 5.  Generalized anasarca. 6. No adenopathy. Probable multinodular  goiter without dominant mass. CT angiogram abdomen; CT angiogram pelvis: 1. There is a mass in the right lower abdomen containing irregular calcification measuring 4.5 x 2.7 cm. This lesion was not appreciable on prior CT from July 2018. Question localized adenopathy versus a mass arising from the mesentery. A neoplastic etiology must be of concern. A carcinoid tumor could present in this manner. Appropriate laboratory correlation in this regard advised. 2. Areas of aortic atherosclerosis. No hemodynamically significant obstruction seen in the aorta or major mesenteric vessels. No aneurysm or dissection is seen in the aorta, major pelvic, or visualized mesenteric arterial vessels. 3.  Moderate ascites in the abdomen and pelvis.  Diffuse anasarca. 4. Sigmoid diverticulosis without diverticulitis. No bowel obstruction. No abscess. Appendix region appears normal. 5.  Uterus absent.  Gallbladder absent. 6. Postoperative change in the lower lumbar and upper sacral regions. Aortic Atherosclerosis (ICD10-I70.0). Electronically Signed   By: Lowella Grip III M.D.   On: March 03, 2017 09:53    ____________________________________________   PROCEDURES Procedures CRITICAL CARE Performed by: Joni Fears, Kortlynn Poust   Total critical care time: 35 minutes  Critical care time was exclusive of separately billable procedures and treating other patients.  Critical care was necessary to treat or prevent imminent or life-threatening deterioration.  Critical care was time spent personally by me on the following activities: development of treatment plan with patient and/or surrogate as well as nursing, discussions with consultants, evaluation of patient's response to treatment, examination of patient, obtaining history from patient or surrogate, ordering and performing treatments and interventions, ordering and review of laboratory studies, ordering and review of radiographic studies, pulse oximetry and re-evaluation of  patient's condition.  ____________________________________________ DIFFERENTIAL DIAGNOSIS AAA, aortic dissection, cellulitis, bowel obstruction or perforation, intra-abdominal abscess, appendicitis, cholecystitis, sepsis.   CLINICAL IMPRESSION / ASSESSMENT AND PLAN / ED COURSE  Pertinent labs & imaging results that were available during my care of the patient were reviewed by me and considered in my medical decision making (see chart for details).     Clinical Course as of Feb 27 1125  Tue 03/03/2017  2831 Widespread bruising, ?hypotension. Concern for AAA vs supra-tx INR from warfarin.  Check labs. Stat CT. D/w radiology to expedite scan. IVF bolus. Not c/w nec fas/fournier's, doubt sepsis at this time, but with hypotension and tachypnea, will start sepsis protocol pending workup. Presentation not c/w anaphylaxis.  [PS]  0910 Condition unchanged. IV access obtained. Labs obtained. CT waiting for pt.  [PS]  1001 Had a long discussion with granddaughter at bedside. Recent concerns about severe sepsis. Updated on troponin and lactate results which are very concerning. CODE STATUS is DO NOT RESUSCITATE. I asked about possible central line placement or vasopressor use, granddaughter wasn't sure and will call her mom, patient's daughter, to come to the hospital to help guide care. Continue antibiotics and IV fluids.  [PS]  1003 Glucose 50 - will give d50. Pt still has abd pain. Need to avoid opioids due to hypotension. Will give iv tylenol. In current condition, presentation not c/w ACS, not a candidate for [facility] due to warfarin use and suspected bleeding.  [PS]  1019 CTA c/a/p reviewed, cxr reviewed. Likely RML pneumonia, R pleural effusion. Also abdominal ascites and anasarca.  No acute bleeding source or aortic injury identified.  [PS]  1109 Daughter, healthcare POA at bedside. Updated on all results. Plan to admit. Blood pressure stable, persistently borderline hypotensive with a map of 60, blood  pressure 65/50. Heart rate 80. 1500 mL of saline infused so far. Saline infusion has been delayed by patient frequently bending her arm and obstructing her IV tubing. Still in severe pain. Family indicates a priority for pain control. Considering goals of care, they're not amenable to central line or vasopressor use at this time. I'll try Toradol for now given that there is not any evidence of bleeding and she has ESRD and is unlikely to be affected by NSAID use.  [PS]  1126 Case d/w hospitalist.   [PS]    Clinical Course User Index [PS] Carrie Mew, MD     ____________________________________________   FINAL CLINICAL IMPRESSION(S) / ED DIAGNOSES    Final diagnoses:  Septic shock ([place])  Pneumonia of right middle lobe due to infectious organism [facility])  Anasarca  End-stage renal disease on hemodialysis [facility])       Portions of this note were generated with dragon dictation software. Dictation errors may occur despite best attempts at proofreading.      Carrie Mew, MD 02-28-17 [phone]

## 2017-03-23 NOTE — ED Notes (Signed)
Pt is now unhooked from monitors and is receiving comfort care at this time. RN will continue to monitor. Family at bedside.

## 2017-03-23 NOTE — ED Provider Notes (Addendum)
Patient noted to become suddenly unresponsive. Asystole on the monitor. CODE STATUS is DO NOT RESUSCITATE. On repeat examination, there are no heart sounds. No spontaneous respirations. Pupils are nonreactive, no cranial nerve reflexes. Patient pronounced dead at 12:40 PM.  Discussed with family members at the bedside. Chaplain paged at their request.    Hospitalist updated.   Carrie Mew, MD 2017-02-28 1248   Attempted to contact the patient's primary care doctor, doctors making house calls. Have not received a call back. We'll update them on patient's course if they return the call. As the patient appears to have died from natural causes due to septic shock, this does not appear to be a case for the medical examiner.   Carrie Mew, MD 28-Feb-2017 1326  Discussed with patient's primary care doctor, Clemon Chambers. Updated on presentation, clinical course, and that the patient here in the emergency department. They will plan to complete the death certificate.   Carrie Mew, MD 2017-02-28 [phone]

## 2017-03-23 NOTE — ED Notes (Signed)
Date and time results received: Mar 16, 2017 10:24 AM  (use smartphrase ".now" to insert current time)  Test: 10:24 AM  Critical Value: WBC  Name of Provider Notified: 0.8  Orders Received? Or Actions Taken?: Critical Results Acknowledged

## 2017-03-23 NOTE — ED Notes (Signed)
Per Pt family they request that pt stay in ED Room until family (pt husband) arrives. This RN approved this through charge Shelda Pal and accepting RN Caryl Pina is aware as well. Rn will continue to monitor.

## 2017-03-23 NOTE — ED Notes (Signed)
This RN contacted Orderly of pt pickup. Orderly notified RN to call tele tracking.

## 2017-03-23 NOTE — ED Triage Notes (Signed)
Pt presents to ED via [facility] with c/o altered mental status and possible allergic reaction with redness  noted from R breast to her feet with bruising to her abdomen. Per EMS pt's facility denies any recently falls or trauma, pt takes coumadin. Per EMS pt received approx 400cc's NS, 50mg  IV benadryl en route. Upon arrival pt is noted to be repeatedly moaning and calling out, EMS reports pt is normally alert and oriented, pt presents confused with mostly incomprehensible speech and moaning.

## 2017-03-23 NOTE — Progress Notes (Signed)
CH responded to consult for patient death. CH provided prayer and emotional support. CH called to check on comfort cart for family. [place] provided nurse with funeral home information and witnessed family sign release form. [place] available for follow-up. Family is waiting in room for a couple more people to arrive. Patient's spouse was taken to ED for evaluation as he was having some trouble after the loss of the patient.

## 2017-03-23 NOTE — ED Notes (Signed)
Tele tracking contacted will notify orderly

## 2017-03-23 DEATH — deceased

## 2017-08-28 IMAGING — US US EXTREM LOW VENOUS BILAT
1 series · 13 of 24 positions shown · non-contrast
Comparison: None.

CLINICAL DATA: Bilateral lower extremity edema for 1 day. History
of ovarian cancer. Evaluate for DVT.



[Series 1: us extrem low venous bilat · 0.07mm/px · 13 of 68 slices shown]
[im 1/68]
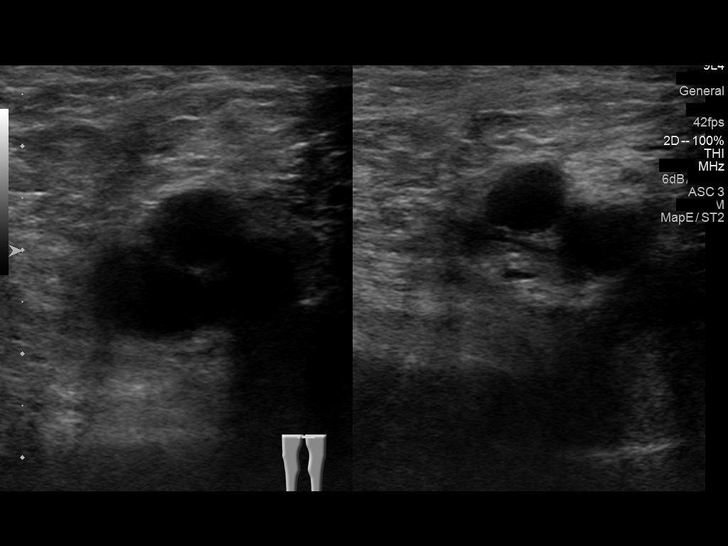
[im 6/68]
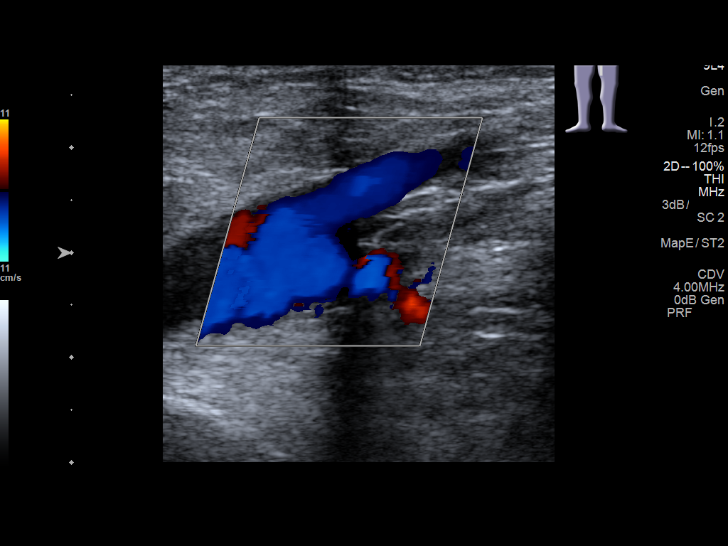
[im 12/68]
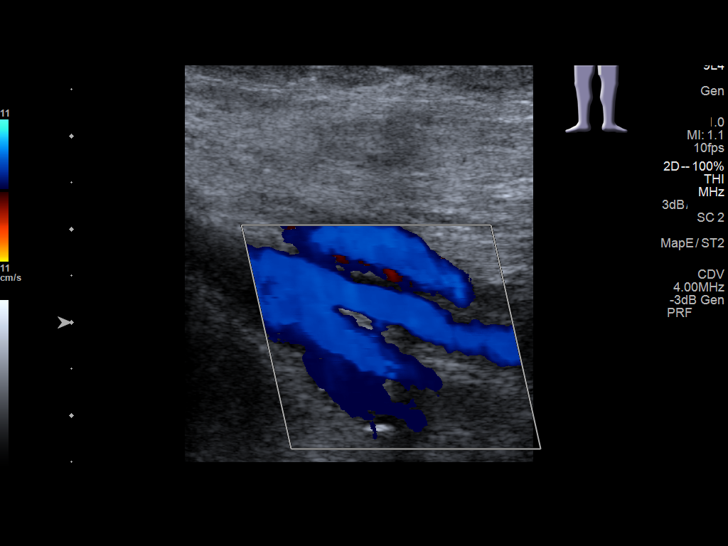
[im 18/68]
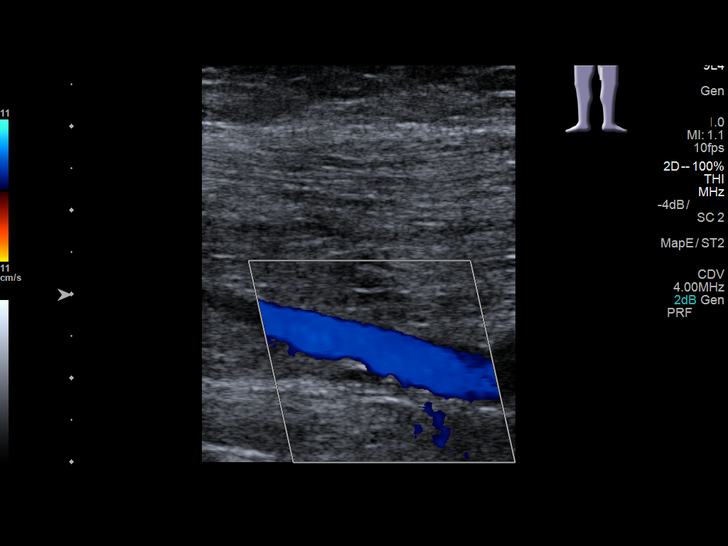
[im 24/68]
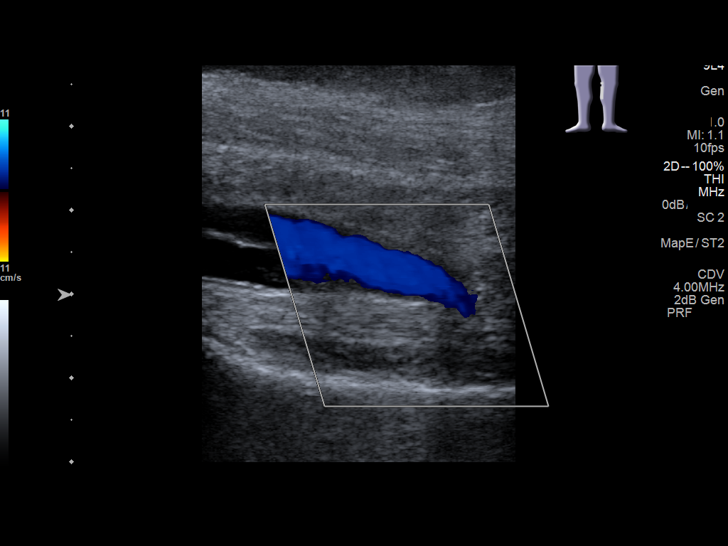
[im 30/68]
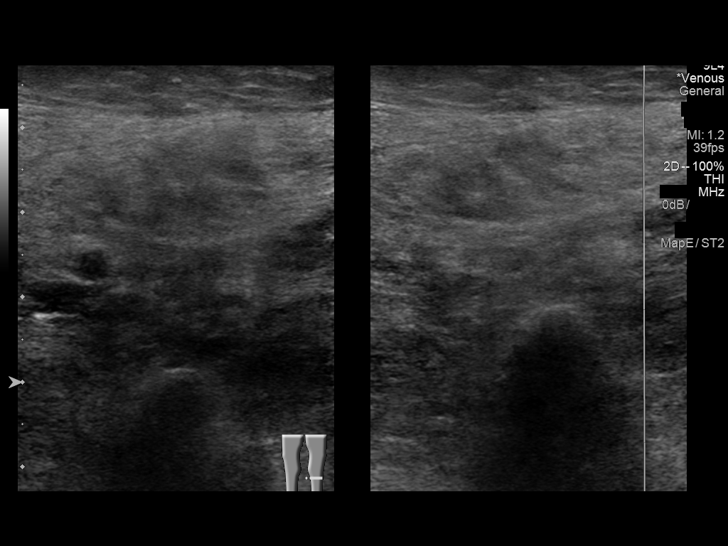
[im 35/68]
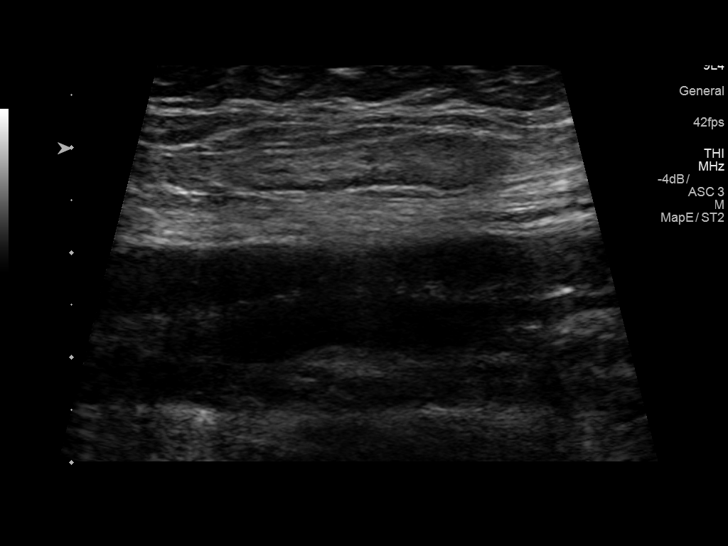
[im 38/68]
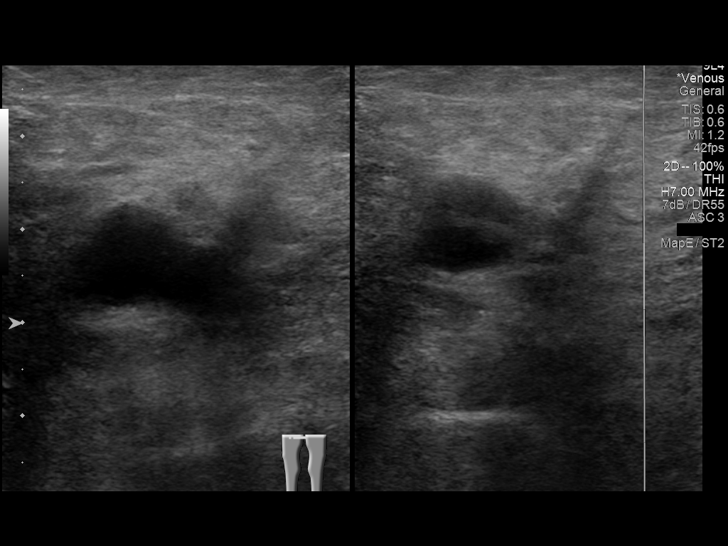
[im 44/68]
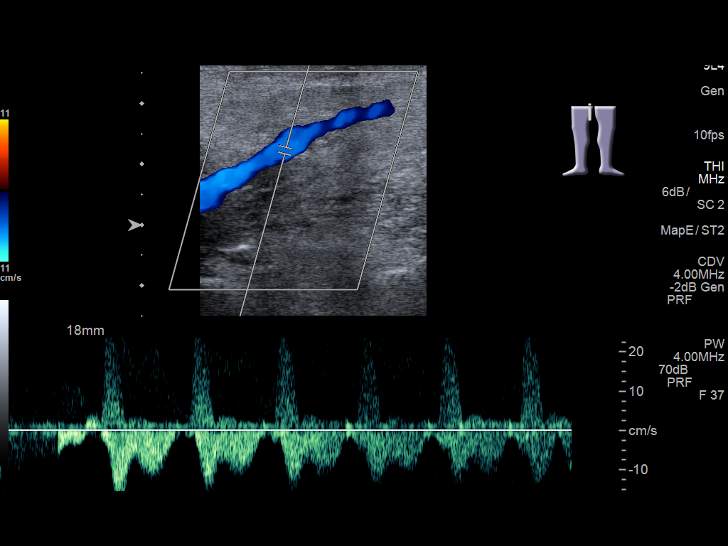
[im 50/68]
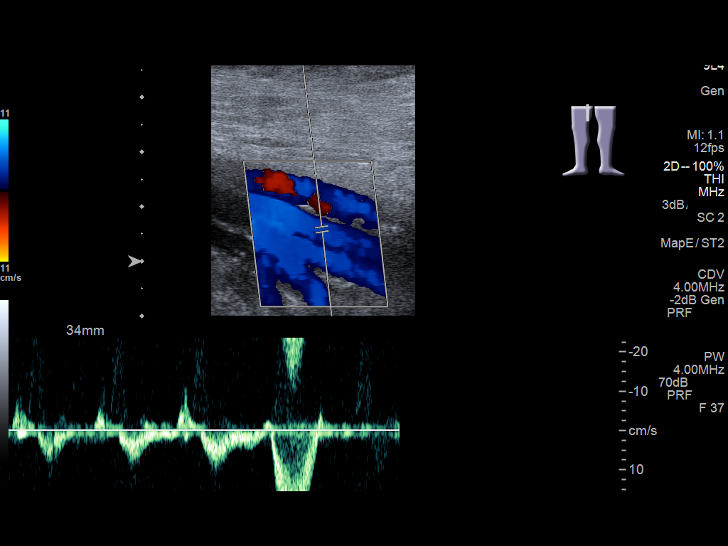
[im 56/68]
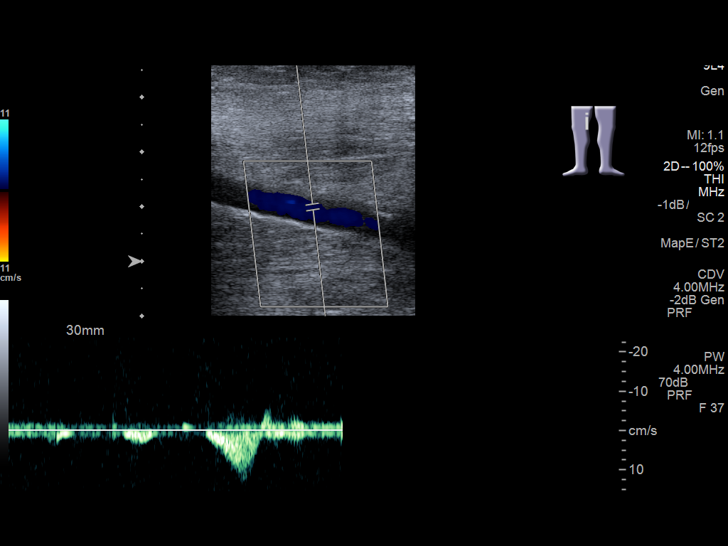
[im 62/68]
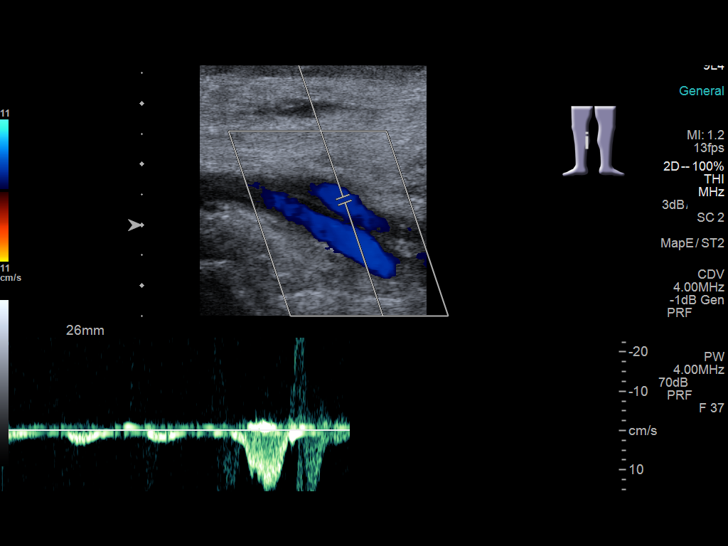
[im 68/68]
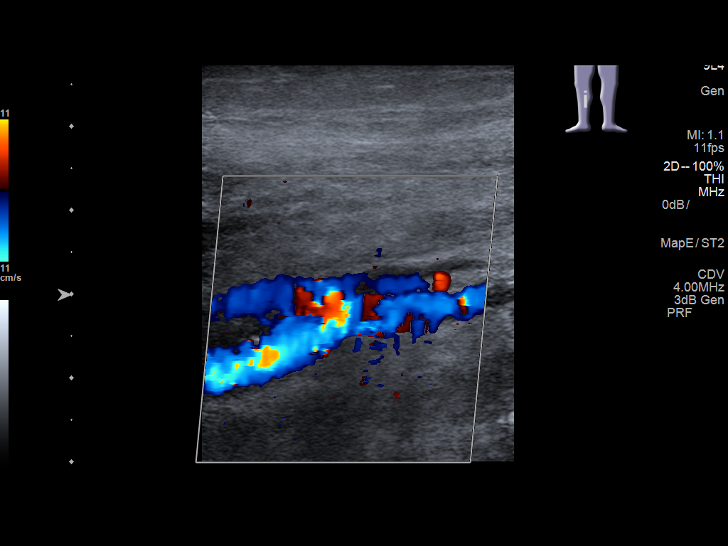

[13 of 24 positions shown; findings below may reference images not displayed]

FINDINGS: RIGHT LOWER EXTREMITY

Common Femoral Vein: No evidence of thrombus. Normal
compressibility, respiratory phasicity and response to augmentation.

Saphenofemoral Junction: No evidence of thrombus. Normal
compressibility and flow on color Doppler imaging.

Profunda Femoral Vein: No evidence of thrombus. Normal
compressibility and flow on color Doppler imaging.

Femoral Vein: No evidence of thrombus. Normal compressibility,
respiratory phasicity and response to augmentation.

Popliteal Vein: No evidence of thrombus. Normal compressibility,
respiratory phasicity and response to augmentation.

Calf Veins: No evidence of thrombus. Normal compressibility and flow
on color Doppler imaging.

Superficial Great Saphenous Vein: No evidence of thrombus. Normal
compressibility and flow on color Doppler imaging.

Venous Reflux:  None.

Other Findings:  None.

LEFT LOWER EXTREMITY

Common Femoral Vein: No evidence of thrombus. Normal
compressibility, respiratory phasicity and response to augmentation.

Saphenofemoral Junction: No evidence of thrombus. Normal
compressibility and flow on color Doppler imaging.

Profunda Femoral Vein: No evidence of thrombus. Normal
compressibility and flow on color Doppler imaging.

Femoral Vein: No evidence of thrombus. Normal compressibility,
respiratory phasicity and response to augmentation.

Popliteal Vein: No evidence of thrombus. Normal compressibility,
respiratory phasicity and response to augmentation.

Calf Veins: No evidence of thrombus. Normal compressibility and flow
on color Doppler imaging.

Superficial Great Saphenous Vein: No evidence of thrombus. Normal
compressibility and flow on color Doppler imaging.

Venous Reflux:  None.

Other Findings: Note is made of a benign appearing left inguinal
lymph node which is not enlarged by size criteria measuring 0.4 cm
in greatest short axis diameter and maintains a benign fatty hilum
(image 38).
IMPRESSION: No evidence of DVT within either lower extremity.

## 2018-01-10 IMAGING — CR DG CHEST 2V
1 series · 2 of 2 positions shown · non-contrast
Comparison: 09/28/2014

CLINICAL DATA: TB screening.

EXAM:
CHEST  2 VIEW

[Series 1: dg chest 2 view · 0.14mm/px · 2 of 2 slices shown]
[im 1/2]
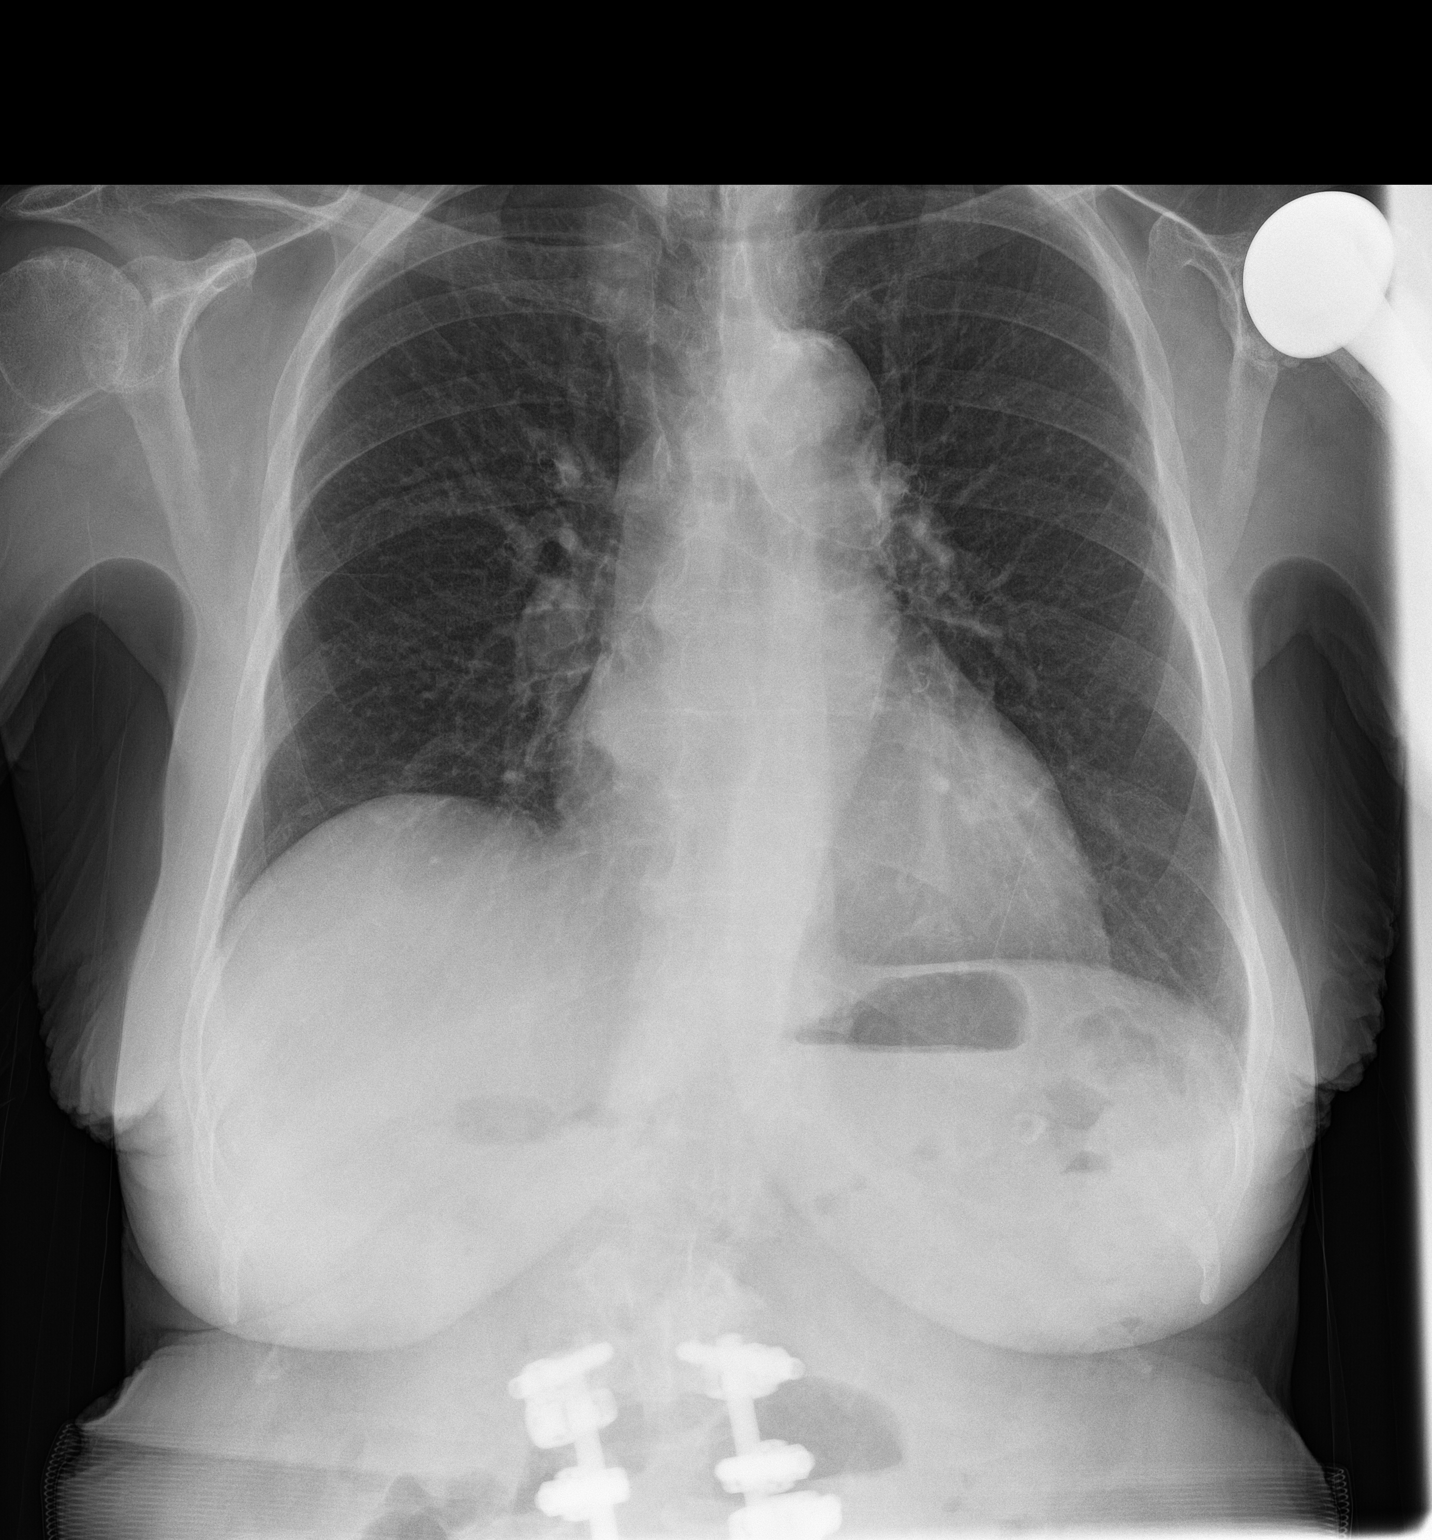
[im 2/2]
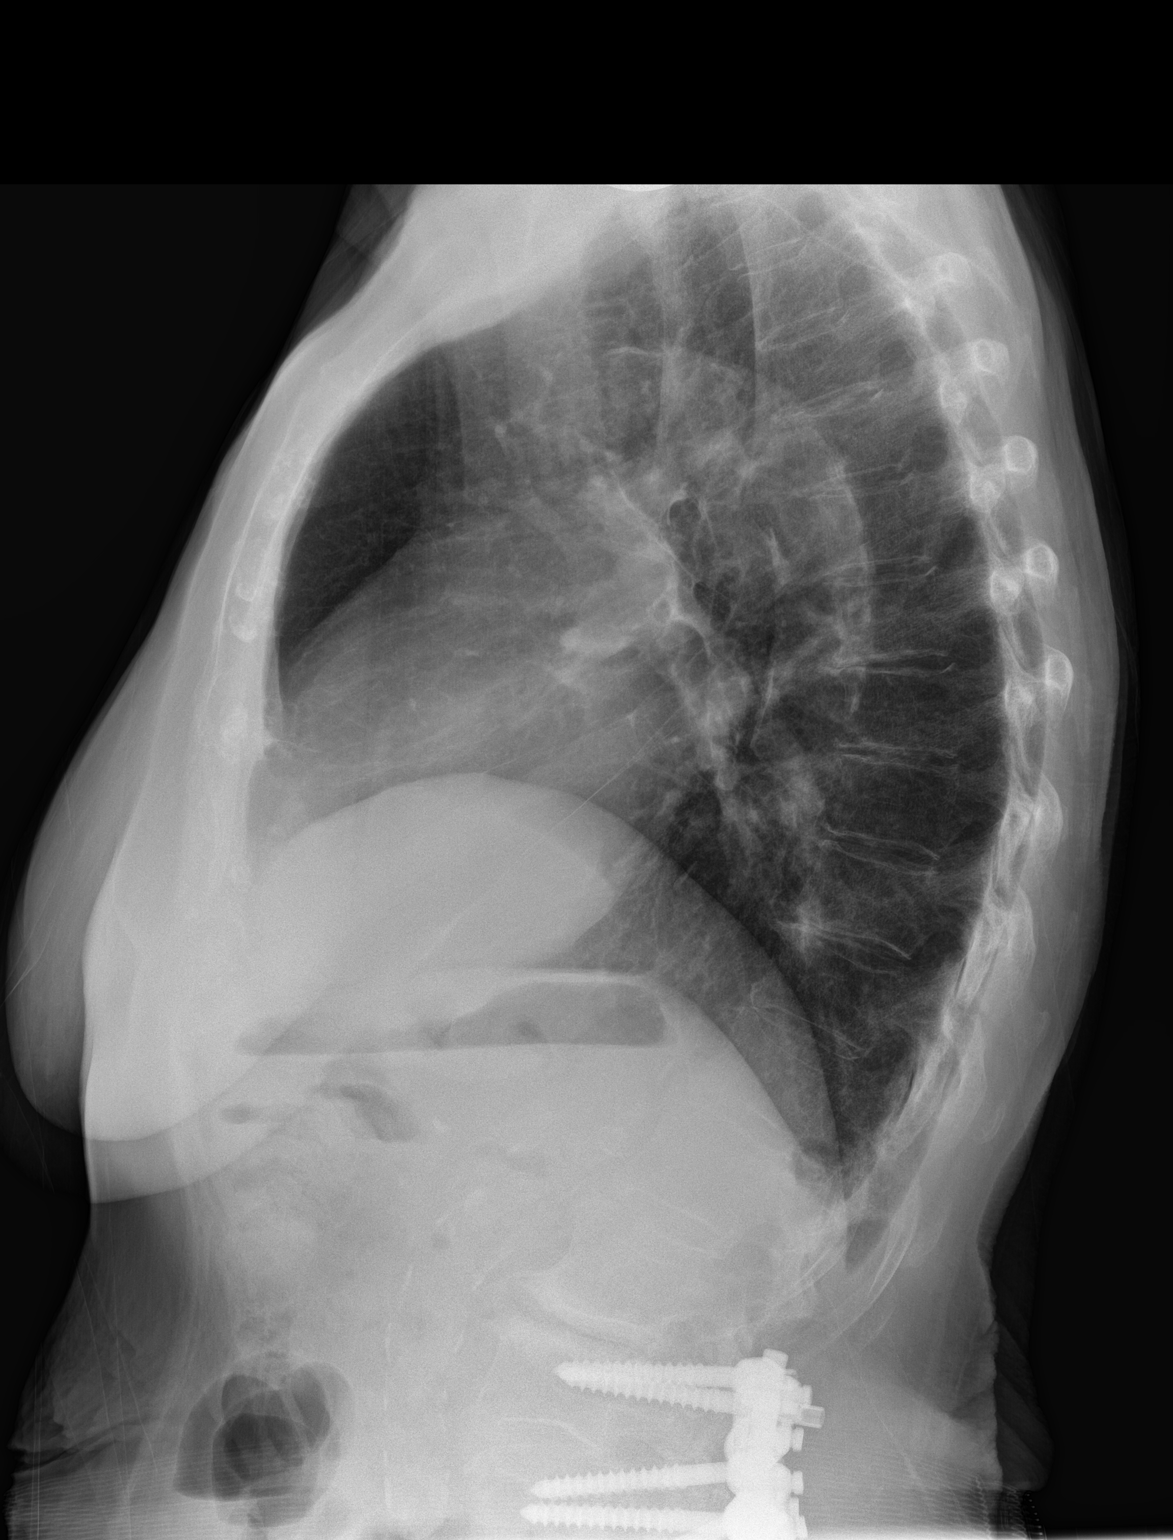

[2 of 2 positions shown; findings below may reference images not displayed]

FINDINGS: Heart and mediastinal contours are within normal limits. No focal
opacities or effusions. No acute bony abnormality. Prior left
shoulder replacement.
IMPRESSION: No active cardiopulmonary disease.
